# Patient Record
Sex: Male | Born: 1935 | Race: White | Hispanic: No | Marital: Married | Smoking: Former smoker
Health system: Southern US, Community
[De-identification: ages and names within clinical notes are randomized; demographics above are authoritative.]

## PROBLEM LIST (undated history)

## (undated) DIAGNOSIS — G4730 Sleep apnea, unspecified: Secondary | ICD-10-CM

## (undated) DIAGNOSIS — E785 Hyperlipidemia, unspecified: Secondary | ICD-10-CM

## (undated) DIAGNOSIS — D649 Anemia, unspecified: Secondary | ICD-10-CM

## (undated) DIAGNOSIS — E039 Hypothyroidism, unspecified: Secondary | ICD-10-CM

## (undated) DIAGNOSIS — R51 Headache: Secondary | ICD-10-CM

## (undated) DIAGNOSIS — Z860100 Personal history of colon polyps, unspecified: Secondary | ICD-10-CM

## (undated) DIAGNOSIS — I471 Supraventricular tachycardia, unspecified: Secondary | ICD-10-CM

## (undated) DIAGNOSIS — H3530 Unspecified macular degeneration: Secondary | ICD-10-CM

## (undated) DIAGNOSIS — E079 Disorder of thyroid, unspecified: Secondary | ICD-10-CM

## (undated) DIAGNOSIS — Z8719 Personal history of other diseases of the digestive system: Secondary | ICD-10-CM

## (undated) DIAGNOSIS — Z87438 Personal history of other diseases of male genital organs: Secondary | ICD-10-CM

## (undated) DIAGNOSIS — K219 Gastro-esophageal reflux disease without esophagitis: Secondary | ICD-10-CM

## (undated) DIAGNOSIS — R0602 Shortness of breath: Secondary | ICD-10-CM

## (undated) DIAGNOSIS — Z9889 Other specified postprocedural states: Secondary | ICD-10-CM

## (undated) HISTORY — DX: Personal history of other diseases of male genital organs: Z87.438

## (undated) HISTORY — DX: Gastro-esophageal reflux disease without esophagitis: K21.9

## (undated) HISTORY — DX: Other specified postprocedural states: Z98.890

## (undated) HISTORY — DX: Anemia, unspecified: D64.9

## (undated) HISTORY — DX: Hyperlipidemia, unspecified: E78.5

## (undated) HISTORY — DX: Personal history of colon polyps, unspecified: Z86.0100

## (undated) HISTORY — DX: Supraventricular tachycardia, unspecified: I47.10

## (undated) HISTORY — DX: Supraventricular tachycardia: I47.1

## (undated) HISTORY — PX: HERNIA REPAIR: SHX51

## (undated) HISTORY — DX: Disorder of thyroid, unspecified: E07.9

## (undated) HISTORY — DX: Personal history of colonic polyps: Z86.010

## (undated) HISTORY — DX: Personal history of other diseases of the digestive system: Z87.19

## (undated) HISTORY — PX: OTHER SURGICAL HISTORY: SHX169

## (undated) HISTORY — DX: Headache: R51

---

## 1963-08-01 DIAGNOSIS — Z9889 Other specified postprocedural states: Secondary | ICD-10-CM

## 1963-08-01 HISTORY — DX: Other specified postprocedural states: Z98.890

## 1963-08-01 HISTORY — PX: OTHER SURGICAL HISTORY: SHX169

## 1965-07-31 HISTORY — PX: THYROIDECTOMY: SHX17

## 2006-01-30 ENCOUNTER — Encounter: Payer: Self-pay | Admitting: Family Medicine

## 2006-11-21 ENCOUNTER — Ambulatory Visit: Payer: Self-pay | Admitting: Internal Medicine

## 2006-11-27 ENCOUNTER — Encounter: Payer: Self-pay | Admitting: Family Medicine

## 2006-11-27 ENCOUNTER — Ambulatory Visit: Payer: Self-pay

## 2006-12-19 ENCOUNTER — Ambulatory Visit (HOSPITAL_BASED_OUTPATIENT_CLINIC_OR_DEPARTMENT_OTHER): Admission: RE | Admit: 2006-12-19 | Discharge: 2006-12-19 | Payer: Self-pay | Admitting: Internal Medicine

## 2006-12-29 ENCOUNTER — Ambulatory Visit: Payer: Self-pay | Admitting: Pulmonary Disease

## 2007-01-01 ENCOUNTER — Encounter: Payer: Self-pay | Admitting: Family Medicine

## 2007-01-16 ENCOUNTER — Ambulatory Visit: Payer: Self-pay | Admitting: Internal Medicine

## 2007-07-09 ENCOUNTER — Encounter: Payer: Self-pay | Admitting: Family Medicine

## 2007-07-09 ENCOUNTER — Encounter: Admission: RE | Admit: 2007-07-09 | Discharge: 2007-07-09 | Payer: Self-pay | Admitting: Family Medicine

## 2007-12-25 ENCOUNTER — Encounter: Payer: Self-pay | Admitting: Family Medicine

## 2008-01-28 ENCOUNTER — Encounter (INDEPENDENT_AMBULATORY_CARE_PROVIDER_SITE_OTHER): Payer: Self-pay | Admitting: General Surgery

## 2008-01-28 ENCOUNTER — Ambulatory Visit (HOSPITAL_COMMUNITY): Admission: RE | Admit: 2008-01-28 | Discharge: 2008-01-28 | Payer: Self-pay | Admitting: General Surgery

## 2008-04-10 ENCOUNTER — Encounter: Payer: Self-pay | Admitting: Family Medicine

## 2008-04-17 ENCOUNTER — Encounter: Admission: RE | Admit: 2008-04-17 | Discharge: 2008-04-17 | Payer: Self-pay | Admitting: Gastroenterology

## 2008-04-29 ENCOUNTER — Encounter: Payer: Self-pay | Admitting: Family Medicine

## 2009-02-09 DIAGNOSIS — Z8679 Personal history of other diseases of the circulatory system: Secondary | ICD-10-CM | POA: Insufficient documentation

## 2009-02-09 DIAGNOSIS — R55 Syncope and collapse: Secondary | ICD-10-CM | POA: Insufficient documentation

## 2009-02-09 DIAGNOSIS — K219 Gastro-esophageal reflux disease without esophagitis: Secondary | ICD-10-CM | POA: Insufficient documentation

## 2009-02-09 DIAGNOSIS — Z860100 Personal history of colon polyps, unspecified: Secondary | ICD-10-CM | POA: Insufficient documentation

## 2009-02-09 DIAGNOSIS — R51 Headache: Secondary | ICD-10-CM | POA: Insufficient documentation

## 2009-02-09 DIAGNOSIS — M129 Arthropathy, unspecified: Secondary | ICD-10-CM | POA: Insufficient documentation

## 2009-02-09 DIAGNOSIS — R519 Headache, unspecified: Secondary | ICD-10-CM | POA: Insufficient documentation

## 2009-03-03 ENCOUNTER — Ambulatory Visit: Payer: Self-pay | Admitting: Family Medicine

## 2009-03-03 DIAGNOSIS — D649 Anemia, unspecified: Secondary | ICD-10-CM | POA: Insufficient documentation

## 2009-03-03 DIAGNOSIS — T50995A Adverse effect of other drugs, medicaments and biological substances, initial encounter: Secondary | ICD-10-CM | POA: Insufficient documentation

## 2009-03-03 DIAGNOSIS — E559 Vitamin D deficiency, unspecified: Secondary | ICD-10-CM | POA: Insufficient documentation

## 2009-03-03 DIAGNOSIS — E785 Hyperlipidemia, unspecified: Secondary | ICD-10-CM | POA: Insufficient documentation

## 2009-03-03 DIAGNOSIS — E039 Hypothyroidism, unspecified: Secondary | ICD-10-CM | POA: Insufficient documentation

## 2009-03-03 LAB — HM COLONOSCOPY

## 2009-03-03 LAB — CONVERTED CEMR LAB
Bilirubin Urine: NEGATIVE
Blood in Urine, dipstick: NEGATIVE
Glucose, Urine, Semiquant: NEGATIVE
Ketones, urine, test strip: NEGATIVE
Nitrite: NEGATIVE
Protein, U semiquant: NEGATIVE
Specific Gravity, Urine: 1.02
Urobilinogen, UA: 0.2
WBC Urine, dipstick: NEGATIVE
pH: 7

## 2009-03-25 LAB — CONVERTED CEMR LAB
ALT: 19 units/L (ref 0–53)
AST: 22 units/L (ref 0–37)
Albumin: 4 g/dL (ref 3.5–5.2)
Alkaline Phosphatase: 84 units/L (ref 39–117)
BUN: 9 mg/dL (ref 6–23)
Basophils Absolute: 0 10*3/uL (ref 0.0–0.1)
Basophils Relative: 0.5 % (ref 0.0–3.0)
Bilirubin, Direct: 0.1 mg/dL (ref 0.0–0.3)
CO2: 31 meq/L (ref 19–32)
Calcium: 9.3 mg/dL (ref 8.4–10.5)
Chloride: 104 meq/L (ref 96–112)
Cholesterol: 183 mg/dL (ref 0–200)
Creatinine, Ser: 1 mg/dL (ref 0.4–1.5)
Eosinophils Absolute: 0.2 10*3/uL (ref 0.0–0.7)
Eosinophils Relative: 3.5 % (ref 0.0–5.0)
GFR calc non Af Amer: 78.03 mL/min (ref >60)
Glucose, Bld: 104 mg/dL — ABNORMAL HIGH (ref 70–99)
HCT: 43.9 % (ref 39.0–52.0)
HDL: 43 mg/dL (ref >39.00)
Hemoglobin: 14.8 g/dL (ref 13.0–17.0)
LDL Cholesterol: 119 mg/dL — ABNORMAL HIGH (ref 0–99)
Lymphocytes Relative: 32.1 % (ref 12.0–46.0)
Lymphs Abs: 1.4 10*3/uL (ref 0.7–4.0)
MCHC: 33.8 g/dL (ref 30.0–36.0)
MCV: 86.3 fL (ref 78.0–100.0)
Monocytes Absolute: 0.6 10*3/uL (ref 0.1–1.0)
Monocytes Relative: 13.2 % — ABNORMAL HIGH (ref 3.0–12.0)
Neutro Abs: 2.1 10*3/uL (ref 1.4–7.7)
Neutrophils Relative %: 50.7 % (ref 43.0–77.0)
PSA: 0.88 ng/mL (ref 0.10–4.00)
Platelets: 191 10*3/uL (ref 150.0–400.0)
Potassium: 4.5 meq/L (ref 3.5–5.1)
RBC: 5.08 M/uL (ref 4.22–5.81)
RDW: 13 % (ref 11.5–14.6)
Sodium: 140 meq/L (ref 135–145)
TSH: 1.23 microintl units/mL (ref 0.35–5.50)
Total Bilirubin: 1 mg/dL (ref 0.3–1.2)
Total CHOL/HDL Ratio: 4
Total Protein: 7.7 g/dL (ref 6.0–8.3)
Triglycerides: 107 mg/dL (ref 0.0–149.0)
VLDL: 21.4 mg/dL (ref 0.0–40.0)
Vit D, 25-Hydroxy: 63 ng/mL (ref 30–89)
WBC: 4.3 10*3/uL — ABNORMAL LOW (ref 4.5–10.5)

## 2009-05-13 ENCOUNTER — Ambulatory Visit: Payer: Self-pay | Admitting: Family Medicine

## 2010-03-16 ENCOUNTER — Ambulatory Visit: Payer: Self-pay | Admitting: Family Medicine

## 2010-03-16 DIAGNOSIS — Z87898 Personal history of other specified conditions: Secondary | ICD-10-CM

## 2010-03-16 DIAGNOSIS — J310 Chronic rhinitis: Secondary | ICD-10-CM | POA: Insufficient documentation

## 2010-03-16 DIAGNOSIS — E785 Hyperlipidemia, unspecified: Secondary | ICD-10-CM | POA: Insufficient documentation

## 2010-03-17 ENCOUNTER — Ambulatory Visit: Payer: Self-pay | Admitting: Family Medicine

## 2010-03-23 LAB — CONVERTED CEMR LAB
ALT: 19 units/L (ref 0–53)
AST: 20 units/L (ref 0–37)
Albumin: 3.9 g/dL (ref 3.5–5.2)
Alkaline Phosphatase: 77 units/L (ref 39–117)
BUN: 14 mg/dL (ref 6–23)
Basophils Absolute: 0.1 10*3/uL (ref 0.0–0.1)
Basophils Relative: 1.1 % (ref 0.0–3.0)
Bilirubin Urine: NEGATIVE
Bilirubin, Direct: 0.1 mg/dL (ref 0.0–0.3)
Blood in Urine, dipstick: NEGATIVE
CO2: 27 meq/L (ref 19–32)
Calcium: 9.5 mg/dL (ref 8.4–10.5)
Chloride: 100 meq/L (ref 96–112)
Cholesterol: 186 mg/dL (ref 0–200)
Creatinine, Ser: 1 mg/dL (ref 0.4–1.5)
Eosinophils Absolute: 0.2 10*3/uL (ref 0.0–0.7)
Eosinophils Relative: 4.1 % (ref 0.0–5.0)
GFR calc non Af Amer: 78.72 mL/min (ref >60)
Glucose, Bld: 94 mg/dL (ref 70–99)
Glucose, Urine, Semiquant: NEGATIVE
HCT: 42.5 % (ref 39.0–52.0)
HDL: 37.2 mg/dL — ABNORMAL LOW (ref >39.00)
Hemoglobin: 14.2 g/dL (ref 13.0–17.0)
Ketones, urine, test strip: NEGATIVE
LDL Cholesterol: 123 mg/dL — ABNORMAL HIGH (ref 0–99)
Lymphocytes Relative: 29.9 % (ref 12.0–46.0)
Lymphs Abs: 1.5 10*3/uL (ref 0.7–4.0)
MCHC: 33.3 g/dL (ref 30.0–36.0)
MCV: 86.8 fL (ref 78.0–100.0)
Monocytes Absolute: 0.6 10*3/uL (ref 0.1–1.0)
Monocytes Relative: 12 % (ref 3.0–12.0)
Neutro Abs: 2.6 10*3/uL (ref 1.4–7.7)
Neutrophils Relative %: 52.9 % (ref 43.0–77.0)
Nitrite: NEGATIVE
PSA: 0.74 ng/mL (ref 0.10–4.00)
Platelets: 222 10*3/uL (ref 150.0–400.0)
Potassium: 4.2 meq/L (ref 3.5–5.1)
Protein, U semiquant: NEGATIVE
RBC: 4.9 M/uL (ref 4.22–5.81)
RDW: 14.6 % (ref 11.5–14.6)
Sodium: 135 meq/L (ref 135–145)
Specific Gravity, Urine: 1.02
TSH: 3.31 microintl units/mL (ref 0.35–5.50)
Total Bilirubin: 0.7 mg/dL (ref 0.3–1.2)
Total CHOL/HDL Ratio: 5
Total Protein: 6.6 g/dL (ref 6.0–8.3)
Triglycerides: 127 mg/dL (ref 0.0–149.0)
Urobilinogen, UA: 0.2
VLDL: 25.4 mg/dL (ref 0.0–40.0)
Vit D, 25-Hydroxy: 34 ng/mL (ref 30–89)
WBC Urine, dipstick: NEGATIVE
WBC: 5 10*3/uL (ref 4.5–10.5)
pH: 6

## 2010-03-30 ENCOUNTER — Telehealth: Payer: Self-pay | Admitting: Family Medicine

## 2010-04-08 ENCOUNTER — Encounter: Payer: Self-pay | Admitting: Family Medicine

## 2010-04-20 ENCOUNTER — Ambulatory Visit: Payer: Self-pay | Admitting: Family Medicine

## 2010-09-01 NOTE — Assessment & Plan Note (Signed)
Summary: FLU SHOT/CJR  Nurse Visit   Vitals Entered By: Duard Brady LPN (April 20, 2010 3:15 PM)  Allergies: No Known Drug Allergies  Orders Added: 1)  Flu Vaccine 35yrs + MEDICARE PATIENTS [Q2039] 2)  Administration Flu vaccine - MCR [G0008] Flu Vaccine Consent Questions     Do you have a history of severe allergic reactions to this vaccine? no    Any prior history of allergic reactions to egg and/or gelatin? no    Do you have a sensitivity to the preservative Thimersol? no    Do you have a past history of Guillan-Barre Syndrome? no    Do you currently have an acute febrile illness? no    Have you ever had a severe reaction to latex? no    Vaccine information given and explained to patient? yes    Are you currently pregnant? no    Lot Number:[id number]   Exp Date:01/28/2011   Site Given  Left Deltoid IM.lbmedflu

## 2010-09-01 NOTE — Progress Notes (Signed)
Summary: referral to dr Manon Hilding   Phone Note Call from Patient   Caller: Patient Reason for Call: Referral Summary of Call: pt calling to inquire about referral to dr grapey  Initial call taken by: Pura Spice, RN,  March 30, 2010 10:49 AM  Follow-up for Phone Call        per dr Alfonzo Feller notes in aug office visit pt is to be referred to dr Manon Hilding and informed pt  once terri sch appt she will contact him.  Follow-up by: Pura Spice, RN,  March 30, 2010 10:49 AM

## 2010-09-01 NOTE — Letter (Signed)
Summary: Personal History provided by patient  Personal History provided by patient   Imported By: Maryln Gottron 03/08/2009 10:39:44  _____________________________________________________________________  External Attachment:    Type:   Image     Comment:   External Document

## 2010-09-01 NOTE — Letter (Signed)
Summary: [facility]   Imported By: Maryln Gottron 02/15/2009 13:47:47  _____________________________________________________________________  External Attachment:    Type:   Image     Comment:   External Document

## 2010-09-01 NOTE — Assessment & Plan Note (Signed)
Summary: emp-will fast//ccm   Vital Signs:  Patient profile:   76 year old male Height:      69 inches Weight:      170 pounds BMI:     25.20 O2 Sat:      95 % Temp:     98.8 degrees F Pulse rate:   70 / minute BP sitting:   140 / 80  (left arm)  Vitals Entered By: Pura Spice, RN (March 03, 2009 9:27 AM)  History of Present Illness: Pt in to [facility] as new pt, since beinbg =my patient in past has been under care Dr. Melrose Nakayama, now to reestablis Has een doing fine, had rt inguinal hernia repaired, under care Dr.Kliein for tachycasrdia, and other arrhthmia gerd controlled with prilosec Had goitre and then thyroidectomy, age 66, has been taking synthroid 125 micrograms daily quit smoking 7/31/110 complains some stiffness joints, no specific tx nocturia x 2 Had endoscopy and colonoscopy Sept 09, few polyps goes to chiropractor occasionally , doe back exercises., no medications  Preventive Screening-Counseling & Management  Alcohol-Tobacco     Smoking Status: quit  Allergies (verified): No Known Drug Allergies  Past History:  Past Surgical History: Thyroidectomy  1967 bilateral hernia repair 1991 2009 CYSTOSCOPY 1965 OTHER HX PNEUMONIA 76 YO BROKEN BONES FOOT 76 YO BROKEN FINGER 76 YO SKIN CANCER FACE LAST CK ON 08-22-07   Family History: breast cancer parents grandparents  heart disease parents  father deceased age 43 htn parents   MOTHER HX   mmother deceased ate 10  hx mother having dementia started at age 50 breast cancer at aage 52 thyroid disease macular degeneration    FATHER HX  father deceased at age 19 hypertension pacemaker  congestive heart failure he also had 2 hernia repairs dementia stated at age 100  Social History: Married Airline pilot Former Smoker  QUIT 1977  Smoking Status:  quit  Review of Systems      See HPI General:  See HPI; Denies chills, fatigue, fever, loss of appetite, malaise, sleep disorder, sweats, weakness, and  weight loss. Eyes:  Denies blurring, discharge, double vision, eye irritation, eye pain, halos, itching, light sensitivity, red eye, vision loss-1 eye, and vision loss-both eyes. ENT:  Denies decreased hearing, difficulty swallowing, ear discharge, earache, hoarseness, nasal congestion, nosebleeds, postnasal drainage, ringing in ears, sinus pressure, and sore throat. CV:  arrhthymia, on toprol, sees Dr. Clide Cliff. Resp:  Denies chest discomfort, chest pain with inspiration, cough, coughing up blood, excessive snoring, hypersomnolence, morning headaches, pleuritic, shortness of breath, sputum productive, and wheezing. GI:  Complains of indigestion; denies abdominal pain, bloody stools, change in bowel habits, constipation, dark tarry stools, diarrhea, excessive appetite, gas, hemorrhoids, loss of appetite, nausea, vomiting, vomiting blood, and yellowish skin color; gerd, prilosec. GU:  Complains of nocturia; nocturia x 2. MS:  Complains of mid back pain and stiffness. Derm:  Denies changes in color of skin, changes in nail beds, dryness, excessive perspiration, flushing, hair loss, insect bite(s), itching, lesion(s), poor wound healing, and rash. Neuro:  Denies brief paralysis, difficulty with concentration, disturbances in coordination, falling down, headaches, inability to speak, memory loss, numbness, poor balance, seizures, sensation of room spinning, tingling, tremors, visual disturbances, and weakness. Psych:  Denies alternate hallucination ( auditory/visual), anxiety, depression, easily angered, easily tearful, irritability, mental problems, panic attacks, sense of great danger, suicidal thoughts/plans, thoughts of violence, unusual visions or sounds, and thoughts /plans of harming others. Endo:  Denies cold intolerance, excessive hunger, excessive thirst,  excessive urination, heat intolerance, polyuria, and weight change.  Physical Exam  General:  Well-developed,well-nourished,in no acute distress;  alert,appropriate and cooperative throughout examination Head:  Normocephalic and atraumatic without obvious abnormalities. No apparent alopecia or balding. Eyes:  No corneal or conjunctival inflammation noted. EOMI. Perrla. Funduscopic exam benign, without hemorrhages, exudates or papilledema. Vision grossly normal. Ears:  External ear exam shows no significant lesions or deformities.  Otoscopic examination reveals clear canals, tympanic membranes are intact bilaterally without bulging, retraction, inflammation or discharge. Hearing is grossly normal bilaterally. Nose:  External nasal examination shows no deformity or inflammation. Nasal mucosa are pink and moist without lesions or exudates. Mouth:  Oral mucosa and oropharynx without lesions or exudates.  Teeth in good repair. Neck:  thyroidectomy scar Chest Wall:  No deformities, masses, tenderness or gynecomastia noted. Breasts:  No masses or gynecomastia noted Lungs:  Normal respiratory effort, chest expands symmetrically. Lungs are clear to auscultation, no crackles or wheezes. Heart:  Normal rate and regular rhythm. S1 and S2 normal without gallop, murmur, click, rub or other extra sounds. Abdomen:  Bowel sounds positive,abdomen soft and non-tender without masses, organomegaly or hernias noted, herniorraphy scar rt. Rectal:  No external abnormalities noted. Normal sphincter tone. No rectal masses or tenderness. Genitalia:  Testes bilaterally descended without nodularity, tenderness or masses. No scrotal masses or lesions. No penis lesions or urethral discharge. Prostate:  Prostate gland firm and smooth, no enlargement, nodularity, tenderness, mass, asymmetry or induration. Msk:  No deformity or scoliosis noted of thoracic or lumbar spine.   Pulses:  R and L carotid,radial,femoral,dorsalis pedis and posterior tibial pulses are full and equal bilaterally Extremities:  No clubbing, cyanosis, edema, or deformity noted with normal full range of  motion of all joints.   Neurologic:  No cranial nerve deficits noted. Station and gait are normal. Plantar reflexes are down-going bilaterally. DTRs are symmetrical throughout. Sensory, motor and coordinative functions appear intact. Skin:  Intact without suspicious lesions or rashes Cervical Nodes:  No lymphadenopathy noted Axillary Nodes:  No palpable lymphadenopathy Inguinal Nodes:  No significant adenopathy Psych:  Cognition and judgment appear intact. Alert and cooperative with normal attention span and concentration. No apparent delusions, illusions, hallucinations   Impression & Recommendations:  Problem # 1:  HYPOTHYROIDISM (ICD-244.9) Assessment Improved  His updated medication list for this problem includes:    Synthroid 125 Mcg Tabs (Levothyroxine sodium) .Marland Kitchen... 1 once daily  Orders: TLB-TSH (Thyroid Stimulating Hormone) [phone]) Prescription Created Electronically [phone])  Problem # 2:  COLONIC POLYPS, HX OF (ICD-V12.72) Assessment: Unchanged  Problem # 3:  ARRHYTHMIA, HX OF (ICD-V12.50) Assessment: Improved  Problem # 4:  GERD (ICD-530.81) Assessment: Improved  His updated medication list for this problem includes:    Prilosec 20 Mg Cpdr (Omeprazole) .Marland Kitchen... 1 by mouth once daily  Problem # 5:  HEADACHE (ICD-784.0) Assessment: Improved  His updated medication list for this problem includes:    Toprol Xl 50 Mg Xr24h-tab (Metoprolol succinate) .Marland Kitchen... 1 by mouth once daily    Aspir-low 81 Mg Tbec (Aspirin) .Marland Kitchen... 1 by mouth once daily    Tylenol Extra Strength 500 Mg Tabs (Acetaminophen) .Marland Kitchen... 2 by mouth once daily  Problem # 6:  ARTHRITIS (ICD-716.90) Assessment: Improved  Complete Medication List: 1)  Synthroid 125 Mcg Tabs (Levothyroxine sodium) .Marland Kitchen.. 1 once daily 2)  Toprol Xl 50 Mg Xr24h-tab (Metoprolol succinate) .Marland Kitchen.. 1 by mouth once daily 3)  Prilosec 20 Mg Cpdr (Omeprazole) .Marland Kitchen.. 1 by mouth once daily 4)  Aspir-low 81  Mg Tbec (Aspirin) .Marland Kitchen.. 1 by mouth  once daily 5)  Eye Vitamin  6)  Multivitamins Tabs (Multiple vitamin) .... Once daily 7)  Vitamin B-12 100 Mcg Tabs (Cyanocobalamin) .... 1/2 tab once daily 8)  Tylenol Extra Strength 500 Mg Tabs (Acetaminophen) .... 2 by mouth once daily 9)  Benadryl 25 Mg Caps (Diphenhydramine hcl) .Marland Kitchen.. 1 or 2 hs as needed 10)  Cetirizine Hcl 10 Mg Tabs (Cetirizine hcl) .Marland Kitchen.. 1 once daily 11)  Calcium 600 600 Mg Tabs (Calcium carbonate) .Marland Kitchen.. 1 once daily 12)  Sudafed 30 Mg Tabs (Pseudoephedrine hcl) .... 2 by mouth once daily fi needed for sinus decongestant  Other Orders: Tetanus Toxoid w/Dx [phone]) Admin 1st Vaccine (60454) Venipuncture [phone]) T-Vitamin D (25-Hydroxy) ([id number]) UA Dipstick w/o Micro (automated)  (81003) TLB-Lipid Panel (80061-LIPID) TLB-BMP (Basic Metabolic Panel-BMET) (80048-METABOL) TLB-CBC Platelet - w/Differential (85025-CBCD) TLB-Hepatic/Liver Function Pnl (80076-HEPATIC) TLB-PSA (Prostate Specific Antigen) (84153-PSA)  Patient Instructions: 1)  will call lab results 2)  continue regular medications 3)  call as needed 4)  will refill medications Prescriptions: TOPROL XL 50 MG XR24H-TAB (METOPROLOL SUCCINATE) 1 by mouth once daily   dr Sherryl Manges  #90 x 3   Entered and Authorized by:   Judithann Sheen MD   Signed by:   Judithann Sheen MD on 03/03/2009   Method used:   Print then Give to Patient   RxID:   [id number] SYNTHROID 125 MCG TABS (LEVOTHYROXINE SODIUM) 1 once daily  #90 x 3   Entered and Authorized by:   Judithann Sheen MD   Signed by:   Judithann Sheen MD on 03/03/2009   Method used:   Print then Give to Patient   RxID:   [id number]   Preventive Care Screening  Colonoscopy:    Next Due:  04/2011    Immunization History:  Zostavax History:    Zostavax # 1:  zostavax (01/30/2006)    Zostavax # 1:  zostavax (01/30/2006)  Immunizations Administered:  Tetanus Vaccine:    Vaccine Type: Td    Site: left deltoid     Mfr: Sanofi Pasteur    Dose: 0.5 ml    Route: IM    Given by: Pura Spice, RN    Exp. Date: 10/02/2010    Lot #: [id number]   Laboratory Results   Urine Tests    Routine Urinalysis   Color: yellow Appearance: Clear Glucose: negative   (Normal Range: Negative) Bilirubin: negative   (Normal Range: Negative) Ketone: negative   (Normal Range: Negative) Spec. Gravity: 1.020   (Normal Range: 1.003-1.035) Blood: negative   (Normal Range: Negative) pH: 7.0   (Normal Range: 5.0-8.0) Protein: negative   (Normal Range: Negative) Urobilinogen: 0.2   (Normal Range: 0-1) Nitrite: negative   (Normal Range: Negative) Leukocyte Esterace: negative   (Normal Range: Negative)    Comments: Rita Ohara  March 03, 2009 11:27 AM

## 2010-09-01 NOTE — Procedures (Signed)
Summary: Colonoscopy with biopsy/[facility]  Colonoscopy with biopsy/[facility]   Imported By: Maryln Gottron 02/15/2009 13:50:03  _____________________________________________________________________  External Attachment:    Type:   Image     Comment:   External Document

## 2010-09-01 NOTE — Letter (Signed)
Summary: Zostavax/[facility]  Zostavax/[facility]   Imported By: Maryln Gottron 03/08/2009 10:48:05  _____________________________________________________________________  External Attachment:    Type:   Image     Comment:   External Document

## 2010-09-01 NOTE — Assessment & Plan Note (Signed)
Summary: FLU SHOT/CJR  Nurse Visit   Review of Systems       Flu Vaccine Consent Questions     Do you have a history of severe allergic reactions to this vaccine? no    Any prior history of allergic reactions to egg and/or gelatin? no    Do you have a sensitivity to the preservative Thimersol? no    Do you have a past history of Guillan-Barre Syndrome? no    Do you currently have an acute febrile illness? no    Have you ever had a severe reaction to latex? no    Vaccine information given and explained to patient? yes    Are you currently pregnant? no    Lot Number:[id number]   Exp Date:01/27/2010   Site Given  Left Deltoid IM    Allergies: No Known Drug Allergies  Orders Added: 1)  Flu Vaccine 65yrs + [90658] 2)  Administration Flu vaccine - MCR [G0008]

## 2010-09-01 NOTE — Assessment & Plan Note (Signed)
Summary: med check/refill/cjr   Vital Signs:  Patient profile:   76 year old male Weight:      175 pounds O2 Sat:      96 % Temp:     98.3 degrees F Pulse rate:   67 / minute Pulse rhythm:   regular BP sitting:   140 / 86  (left arm)  Vitals Entered By: Pura Spice, RN (March 16, 2010 11:35 AM) CC: refills  to [facility]    History of Present Illness: This 76 year old white married male is in to discuss his and refill this her medication, to [facility] case and her prescriptions to [facility] He is concerned about our old control as well as a new complaint of some headaches when there is patchy minutes the pain he has nasal congestion. Has no chest pain shortness of breath GERD is well controlled with Prilosec 2 check lab studies were his thyroid status Has a history of arrhythmias controlled with Toprol  Allergies (verified): No Known Drug Allergies  Past History:  Past Medical History: Last updated: 02/09/2009 Headache GERD Colonic polyps, hx of  dr Kinnie Scales removed 2 in 2009 1965 cystoscopy 1967 thyroid operation--removed 1978  hermia repair --rt side 1991 hernia repair left side 2009 hernia repair, right side    HX PAROSYSMAL SUPRAVENTRICULAR TACHYCARDIA  Past Surgical History: Last updated: 03/03/2009 Thyroidectomy  1967 bilateral hernia repair 1991 2009 CYSTOSCOPY 1965 OTHER HX PNEUMONIA 76 YO BROKEN BONES FOOT 76 YO BROKEN FINGER 76 YO SKIN CANCER FACE LAST CK ON 08-22-07   Social History: Last updated: 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/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   Risk Factors: Smoking Status: quit (03/03/2009)  Review of Systems      See HPI  The patient denies anorexia, fever, weight loss, weight gain, vision loss, decreased hearing, hoarseness, chest pain, syncope, dyspnea on exertion, peripheral edema, prolonged cough, headaches, hemoptysis, abdominal pain, melena, hematochezia, severe indigestion/heartburn, hematuria, incontinence, genital sores, muscle weakness,  suspicious skin lesions, transient blindness, difficulty walking, depression, unusual weight change, abnormal bleeding, enlarged lymph nodes, angioedema, breast masses, and testicular masses.    Physical Exam  General:  Well-developed,well-nourished,in no acute distress; alert,appropriate and cooperative throughout examination Head:  Normocephalic and atraumatic without obvious abnormalities. No apparent alopecia or balding. Eyes:  No corneal or conjunctival inflammation noted. EOMI. Perrla. Funduscopic exam benign, without hemorrhages, exudates or papilledema. Vision grossly normal. Ears:  External ear exam shows no significant lesions or deformities.  Otoscopic examination reveals clear canals, tympanic membranes are intact bilaterally without bulging, retraction, inflammation or discharge. Hearing is grossly normal bilaterally. Nose:  boggy nasal mucosa no mucosa swollen Mouth:  Oral mucosa and oropharynx without lesions or exudates.  Teeth in good repair. Neck:  thyroid non-palp Chest Wall:  No deformities, masses, tenderness or gynecomastia noted. Lungs:  Normal respiratory effort, chest expands symmetrically. Lungs are clear to auscultation, no crackles or wheezes. Heart:  Normal rate and regular rhythm. S1 and S2 normal without gallop, murmur, click, rub or other extra sounds. Abdomen:  Bowel sounds positive,abdomen soft and non-tender without masses, organomegaly or hernias noted. Rectal:  No external abnormalities noted. Normal sphincter tone. No rectal masses or tenderness. Genitalia:  Testes bilaterally descended without nodularity, tenderness or masses. No scrotal masses or lesions. No penis lesions or urethral discharge. Prostate:  no nodules, no asymmetry, and 1+ enlarged.   Msk:  No deformity or scoliosis noted of thoracic or lumbar spine.   Extremities:  No clubbing, cyanosis, edema, or deformity noted with  normal full range of motion of all joints.     Impression &  Recommendations:  Problem # 1:  HYPOTHYROIDISM (ICD-244.9) Assessment Improved  His updated medication list for this problem includes:    Synthroid 125 Mcg Tabs (Levothyroxine sodium) .Marland Kitchen... 1 once daily  Orders: Prescription Created Electronically [phone])  Problem # 2:  ARRHYTHMIA, HX OF (ICD-V12.50) Assessment: Improved Toprol XL 50 mg q.d.  Problem # 3:  GERD (ICD-530.81) Assessment: Improved  His updated medication list for this problem includes:    Prilosec 20 Mg Cpdr (Omeprazole) .Marland Kitchen... 1 by mouth once daily  Problem # 4:  HEADACHE (ICD-784.0) Assessment: Unchanged  His updated medication list for this problem includes:    Toprol Xl 50 Mg Xr24h-tab (Metoprolol succinate) .Marland Kitchen... 1 by mouth once daily    Aspir-low 81 Mg Tbec (Aspirin) .Marland Kitchen... 1 by mouth once daily    Tylenol Extra Strength 500 Mg Tabs (Acetaminophen) .Marland Kitchen... 2 by mouth once daily  Problem # 5:  RHINITIS (ICD-472.0) Assessment: New Sudafed  Complete Medication List: 1)  Synthroid 125 Mcg Tabs (Levothyroxine sodium) .Marland Kitchen.. 1 once daily 2)  Toprol Xl 50 Mg Xr24h-tab (Metoprolol succinate) .Marland Kitchen.. 1 by mouth once daily 3)  Prilosec 20 Mg Cpdr (Omeprazole) .Marland Kitchen.. 1 by mouth once daily 4)  Aspir-low 81 Mg Tbec (Aspirin) .Marland Kitchen.. 1 by mouth once daily 5)  Eye Vitamin  6)  Multivitamins Tabs (Multiple vitamin) .... Once daily 7)  Vitamin B-12 100 Mcg Tabs (Cyanocobalamin) .... 1/2 tab once daily 8)  Tylenol Extra Strength 500 Mg Tabs (Acetaminophen) .... 2 by mouth once daily 9)  Benadryl 25 Mg Caps (Diphenhydramine hcl) .Marland Kitchen.. 1 or 2 hs as needed 10)  Cetirizine Hcl 10 Mg Tabs (Cetirizine hcl) .Marland Kitchen.. 1 once daily 11)  Calcium 600 600 Mg Tabs (Calcium carbonate) .Marland Kitchen.. 1 once daily 12)  Sudafed 30 Mg Tabs (Pseudoephedrine hcl) .... 2 by mouth once daily fi needed for sinus decongestant  Patient Instructions: 1)  refilled medications 2)  very pleased with size of prostate 3)  will schedule appt Dr. Isabel Caprice 4)  Please schedule  full labs on  Aug18 5)  we'll call results of labs Prescriptions: TOPROL XL 50 MG XR24H-TAB (METOPROLOL SUCCINATE) 1 by mouth once daily  #90 x 3   Entered and Authorized by:   Judithann Sheen MD   Signed by:   Judithann Sheen MD on 03/16/2010   Method used:   Faxed to ...       [facility] (mail-order)             , [place]         Ph: [phone]       Fax: [phone]   RxID:   [id number] SYNTHROID 125 MCG TABS (LEVOTHYROXINE SODIUM) 1 once daily  #90 x 3   Entered and Authorized by:   Judithann Sheen MD   Signed by:   Judithann Sheen MD on 03/16/2010   Method used:   Faxed to ...       [facility] (mail-order)             , [place]         Ph: [phone]       Fax: [phone]   RxID:   [id number]

## 2010-09-02 NOTE — Consult Note (Signed)
Summary: [facility]  [facility]   Imported By: Maryln Gottron 04/14/2010 15:08:14  _____________________________________________________________________  External Attachment:    Type:   Image     Comment:   External Document

## 2010-12-13 NOTE — Op Note (Signed)
[name], [name]                 ACCOUNT NO.:  [id number]   MEDICAL RECORD NO.:  [id number]          PATIENT TYPE:  AMB   LOCATION:  DAY                          FACILITY:  [facility]   PHYSICIAN:  Sharlet Salina T. Hoxworth, M.D.DATE OF BIRTH:  11-23-1934   DATE OF PROCEDURE:  01/28/2008  DATE OF DISCHARGE:                               OPERATIVE REPORT   PREOPERATIVE DIAGNOSIS:  Recurrent right inguinal hernia and inguinal  mass.   POSTOPERATIVE DIAGNOSIS:  Recurrent right inguinal hernia and inguinal  mass.   SURGICAL PROCEDURE:  Repair of recurrent right inguinal hernia with mesh  and excision of inguinal mass.   SURGEON:  Lorne Skeens. Hoxworth, M.D.   ANESTHESIA:  General.   BRIEF HISTORY:  [name] is a 76 year old male with a history of a right  inguinal hernia repair in the late 1970s.  For about the past 10 years,  he has had intermittent recurrent swelling in the right groin which is  gradually getting worse, and in recent months he has had several severe  episodes of pain in the right groin relieved by lying down the floor and  pushing the hernia back in.  On examination, he has an only partially  reducible right inguinal hernia and also has a separate slightly tender  mass in the right groin, down around the pubic tubercle, questionably  associated with the cord.  This is somewhat tender as well.  With this  constellation of findings, I have recommended open repair with mesh of  his recurrent hernia and excision of the mass in the inguinal canal.  The nature of the procedure, indications, risks of bleeding, infection,  and recurrence were discussed and understood.  He is now brought to the  operating room for this procedure.   DESCRIPTION OF OPERATION:  The patient was brought to the operating room  and placed in the supine position on the operating table, and general  endotracheal anesthesia was induced.  The right groin was widely  sterilely prepped and draped.  He  received preoperative IV antibiotics.  Correct patient and procedure were verified.  I used the previous  oblique incision in the right groin, and dissection was carried down  through the subcutaneous tissue.  The external oblique was identified,  with some scarring around the previous incision.  I went a little more  lateral to that to fresh normal-appearing external oblique, and this was  divided along the line of its fibers, and then down over the scarred  area as well, bluntly dissected this up off of cord structures, and  opening it up through the external ring.  The external oblique was  dissected up off the cord structures, going through some scar tissue as  well, taking it back up medially toward the rectus sheath and laterally  out to the inguinal ligament.  Cord structures were identified, and  these were encircled with Penrose at the pubic tubercle.  There was not  that much scarring, as there had not been previous use of mesh.  The  cord was freed up from scarring in attachments through  the inguinal  canal up to the internal ring.  There was a large mass of fatty tissue  which was separate from the cord structures, coming up through the floor  of the inguinal canal.  This was freed from surrounding adhesions and  mobilized down to the level of the floor of the inguinal canal, where  there was found to be coming through a fairly tight defect just about 1-  1.5 cm in diameter.  I then opened this structure, which was a  peritoneal sac with a good deal of preperitoneal fat.  This sac was then  suture-ligated right at the level of the floor of the canal with 2-0  silk and then removed, and the stump of the sac reduced back through the  floor of the inguinal canal.  Again, this was a very discrete defect  about 1-1.5 cm in diameter.  This was closed with a figure-of-eight  Prolene suture.  At this point, feeling down medially over the pubic  tubercle but superficial to the cord and  the deep subcutaneous, there  was an about 2 x 1 cm rubbery, smooth, firm mass which was not adherent  to the cord.  Cord and testicle were normal at this point.  Mass was  more adherent to the deep subcutaneous tissue.  This was dissected free  with cautery.  While grasping with an Allis clamp, it did open a little  bit and contained apparent sebaceous-type of material, but no odor or  any evidence of infection.  This was all cleaned and irrigated, and the  mass was completely excised from the deep subcutaneous with cautery.  It  did not appear to extend up to the level of the skin.  This was sent for  permanent sections.  The wound was thoroughly irrigated.  Hemostasis was  ensured.  A piece of Parietex mesh was chosen and trimmed to size to fit  the floor of the inguinal canal, and tails around the cord of the  internal ring.  It was sutured initially to the pubic tubercle, and then  working medial to lateral, sutured to the inguinal ligament with running  2-0 Prolene.  Medially, the mesh was sutured to the edge of the rectus  sheath with interrupted Prolene, and then the tails were tacked together  lateral to the internal ring, creating a snug internal ring around the  cord structures.  This provided nice broad coverage of the direct and  indirect spaces, and nice broad coverage of the previous small defect as  noted.  Soft tissue was infiltrated with Marcaine.  The wound was  reirrigated, and hemostasis ensured.  Cord was returned to its anatomic  position.  The external oblique was closed over this with running 3-0  Vicryl.  Scarpa's fascia was closed with running 3-0 Vicryl.  The skin  was closed with subcuticular Monocryl and Dermabond.  Sponge, needle,  and instrument counts were correct.  The patient was taken to recovery  in good condition.      Lorne Skeens. Hoxworth, M.D.  Electronically Signed     BTH/MEDQ  D:  01/28/2008  T:  01/28/2008  Job:  [id number]

## 2010-12-13 NOTE — Assessment & Plan Note (Signed)
[facility]                         ELECTROPHYSIOLOGY OFFICE NOTE   IZAC, FAULKENBERRY                          MRN:          [id number]  DATE:01/16/2007                            DOB:          Feb 23, 1935    Mr. Orndoff comes back.  He is doing better since his sleep study and is  wearing the mask.  His Cardiolite was negative.  Overall, he is doing  better and quite pleased with how he is doing.  He is tolerating his  medications - specifically the metoprolol.   On examination, his blood pressure was 104/62, pulse was 60.  Lungs were  clear, heart sounds were regular and extremities were without edema.   IMPRESSION:  1. Atypical chest pain.  2. Sleep apnea.  3. Palpitations with prior history of documented supraventricular      tachycardia.   We will plan to see Mr. Bondy as needed.  Otherwise, he will follow up  with Dr. Artis Flock.     Duke Salvia, MD, [facility]  Electronically Signed    SCK/MedQ  DD: 01/16/2007  DT: 01/16/2007  Job #: [id number]

## 2010-12-16 NOTE — Letter (Signed)
November 21, 2006    Quita Skye. Artis Flock, M.D.  [address]  [place], [place] [address]   RE:  [name], [name]  MRN:  [id number]  /  DOB:  09-Sep-1934   Dear [name]:   It was a pleasure to see [name] today at your request.  As you  know, he is a 76 year old gentleman that I saw about 6 or 7 years ago  for episodes of syncope.  These turned out to be related to short  episodes of a supraventricular tachycardia felt to be AV nodal reentry  based on a long PR interval.  He was treated with Toprol and these have  been largely quiescent until recently where he has 2 episodes of  presyncope.  These, like his previous episodes, are unassociated with  palpitations.  Their duration is only a couple of seconds and then they  abate.  There is no associated chest pain or shortness of breath.   In addition, of concern to the patient, is new onset exertional  shortness of breath.  This has been going on for the last 2-3 months.  It happens at a distance of only 90-100 feet or so.  It is unaccompanied  by chest pain, diaphoresis, or shortness of breath.  It is accompanied  by fatigue.  It is noteworthy that the patient started walking about a  year ago, stopped about 3 or 4 months ago, not quite sure why, but it is  concurrent with this.  This continuation of his shortness of breath has  been more problematic.   The patient also complains of chest discomfort and left arm discomfort.  This has occurred primarily at rest.  He notices it mostly when he is  watching television.   His cardiac risk factors are negative for hypertension, diabetes,  cigarettes or family history.  His LDL is 110.   His wife also notes that he has been taking a lot of naps recently.  He  has a terrible snoring problem.  He has had a dental implant which he  has not been able to wear for the last 3-4 months.   Around a month ago as well, his Toprol was exchanged out for generic,  and he is wondering whether  this has contributed to his sense of not  being quite as well as he had been.   His past medical history in addition to the above is notable for:  1. Allergies.  2. Hiatal hernia with GE reflux disease.  3. Thyroid disease with a subtotal thyroidectomy.   His past surgical history is notable for the above as well as inguinal  herniorrhaphy x2 and cystoscopy.   SOCIAL HISTORY:  He is married, he has 2 children.  He does not use  cigarettes or recreational drugs.  He drinks a couple of drinks a week.  He exercises until as noted previously.   REVIEW OF SYSTEMS:  Otherwise reviewed and is broadly negative, apart  from the organ systems as noted above.   CURRENT MEDICATIONS:  1. Synthroid 125 mcg.  2. Metoprolol succinate 50.  3. Prilosec 20.  4. Vitamins.   He has no known drug allergies.   PHYSICAL EXAMINATION:  He is an elderly Caucasian male appearing his  stated age of 7.  His blood pressure is 116/81, his pulse is 61, his weight is 169.  HEENT EXAM:  Demonstrated no icterus or xanthoma.  The neck veins were flat.  The carotids were brisk  and full bilaterally  without bruits.  The back was without kyphosis, scoliosis.  Lungs were clear.  Heart sounds were regular without murmurs or gallops.  The abdomen was soft with active bowel sounds without midline pulsation  or hepatomegaly.  Femoral pulses were 2+, distal pulses were intact.  There is no  cyanosis, clubbing, or edema.  NEUROLOGIC EXAM:  Grossly normal.  His skin was warm and dry.   Electrocardiogram dated today demonstrated sinus rhythm at 61 with  intervals of 0.16/0.08/0.41.  There was T-wave flattening rather  diffusely, which is not significantly different from his  electrocardiogram from 5 years ago.   IMPRESSION:  1. Presyncope with previous syncope associated with documented      supraventricular tachycardia.  2. New onset exercise shortness of breath.  3. Atypical chest pain.  4. Daytime somnolence  and significant nocturnal snoring.   [name], Mr. Turano presents a couple of issues.  I first am concerned that  his presyncope may represent a recurrence of his supraventricular  tachycardia.  My thought was that we will just continue to watch this  for right now as it has not been nearly as symptomatic as it was  previously.  In the event that it recurs, an event recorder would be  useful, again, in clarifying.   His exercise shortness of breath is concerning.  He is certainly at the  age demographic where coronary disease or LV dysfunction might be  contributing.  His left arm pain is concerning in this regard, but not  so much that I think catheterization is necessary.  I have reviewed the  issues of certainty, sensitivity, and specificity with the patient and  his wife, and we will plan to proceed with Myoview stress testing to try  and clarify the above.   In addition, I have taken the liberty of setting him up for a sleep  study and will have those results forwarded to you.   I have asked in the interim that he go back on Toprol instead of the  metoprolol, and to see if that is contributing to his symptoms.   [name], thanks, again, very much.    Sincerely,      [name] Salvia, MD, [facility]  Electronically Signed    SCK/MedQ  DD: 11/21/2006  DT: 11/21/2006  Job #: [id number]   CC:    Griffith Citron, M.D.

## 2010-12-16 NOTE — Procedures (Signed)
NAMEGRAESYN, SCHREIFELS NO.:  [id number]   MEDICAL RECORD NO.:  [id number]          PATIENT TYPE:  OUT   LOCATION:  SLEEP CENTER                 FACILITY[facility]  [facility]   PHYSICIAN:  Barbaraann Share, MD,FCCPDATE OF BIRTH:  1936/09/09   DATE OF STUDY:  01/01/2007                            NOCTURNAL POLYSOMNOGRAM   REFERRING PHYSICIAN:  Duke Salvia, MD, [facility]   INDICATION FOR STUDY:  Hypersomnia with sleep apnea.   EPWORTH SLEEPINESS SCORE:  7.   MEDICATIONS:   SLEEP ARCHITECTURE:  The patient had a total sleep time of 309 minutes  with borderline quantity of REM and decreased slow wave sleep.  Sleep  onset latency was normal and REM onset was fairly rapid at 36 minutes.  Sleep efficiency was decreased at 71%.   RESPIRATORY DATA:  The patient underwent split night protocol where he  was found to have 69 obstructive events in the first 138 minutes of  sleep.  This gave the patient an extrapolated apnea-hypopnea index of 30  events per hour over that time period.  The events occurred in all body  positions and there was moderate to loud snoring noted throughout.  By  protocol the patient was then placed on a ResMed medium Mirage Quattro  full face mask and ultimately titrated to a final pressure of 10 cmH2O  with good control of his obstructive events.   OXYGEN DATA:  There was O2 desaturation as low as 83% with the patient's  obstructive events.   CARDIAC DATA:  Occasional PACs but no clinically significant arrhythmias  were noted.   MOVEMENT-PARASOMNIA:  The patient was found to have 236 leg jerks with 1  per hour resulting in arousal or awakening.   IMPRESSIONS-RECOMMENDATIONS:  1. Split night study reveals moderate obstructive sleep apnea with an      apnea-hypopnea index of 30 events per hour and O2 saturation as low      as 83% in the first half of the night.  Patient was then placed on      continuous positive airway pressure with a ResMed Mirage  Quattro      full face mask and ultimately titrated to a final pressure of 10      cmH2O with excellent control of his obstructive events even through      rapid eye movement.  2. Occasional premature atrial contractions noted but no clinically      significant arrhythmias.  3. Very large numbers of leg jerks with minimal sleep disruption.      These were more than likely related to the patient's sleep      disordered breathing.      Barbaraann Share, MD,FCCP  Diplomate, American Board of Sleep  Medicine  Electronically Signed     KMC/MEDQ  D:  01/01/2007 15:50:46  T:  01/01/2007 17:02:55  Job:  [id number]

## 2011-01-03 ENCOUNTER — Ambulatory Visit (INDEPENDENT_AMBULATORY_CARE_PROVIDER_SITE_OTHER): Payer: Medicare Other | Admitting: Family Medicine

## 2011-01-03 ENCOUNTER — Encounter: Payer: Self-pay | Admitting: Family Medicine

## 2011-01-03 VITALS — BP 118/70 | HR 65 | Temp 98.0°F | Ht 70.0 in | Wt 177.5 lb

## 2011-01-03 DIAGNOSIS — N401 Enlarged prostate with lower urinary tract symptoms: Secondary | ICD-10-CM

## 2011-01-03 DIAGNOSIS — Z125 Encounter for screening for malignant neoplasm of prostate: Secondary | ICD-10-CM

## 2011-01-03 DIAGNOSIS — E538 Deficiency of other specified B group vitamins: Secondary | ICD-10-CM

## 2011-01-03 DIAGNOSIS — E785 Hyperlipidemia, unspecified: Secondary | ICD-10-CM

## 2011-01-03 DIAGNOSIS — E039 Hypothyroidism, unspecified: Secondary | ICD-10-CM

## 2011-01-03 DIAGNOSIS — R351 Nocturia: Secondary | ICD-10-CM

## 2011-01-03 DIAGNOSIS — I10 Essential (primary) hypertension: Secondary | ICD-10-CM

## 2011-01-03 DIAGNOSIS — D649 Anemia, unspecified: Secondary | ICD-10-CM

## 2011-01-03 DIAGNOSIS — I498 Other specified cardiac arrhythmias: Secondary | ICD-10-CM

## 2011-01-03 DIAGNOSIS — I471 Supraventricular tachycardia: Secondary | ICD-10-CM

## 2011-01-03 LAB — POCT URINALYSIS DIPSTICK
Bilirubin, UA: NEGATIVE
Blood, UA: NEGATIVE
Glucose, UA: NEGATIVE
Ketones, UA: NEGATIVE
Leukocytes, UA: NEGATIVE
Nitrite, UA: NEGATIVE
Protein, UA: NEGATIVE
Spec Grav, UA: 1.02
Urobilinogen, UA: 0.2
pH, UA: 6

## 2011-01-03 LAB — LIPID PANEL
Cholesterol: 199 mg/dL (ref 0–200)
HDL: 48.5 mg/dL (ref >39.00)
LDL Cholesterol: 124 mg/dL — ABNORMAL HIGH (ref 0–99)
Total CHOL/HDL Ratio: 4
Triglycerides: 134 mg/dL (ref 0.0–149.0)
VLDL: 26.8 mg/dL (ref 0.0–40.0)

## 2011-01-03 LAB — CBC WITH DIFFERENTIAL/PLATELET
Basophils Absolute: 0 10*3/uL (ref 0.0–0.1)
Basophils Relative: 0.7 % (ref 0.0–3.0)
Eosinophils Absolute: 0.2 10*3/uL (ref 0.0–0.7)
Eosinophils Relative: 3 % (ref 0.0–5.0)
HCT: 44.4 % (ref 39.0–52.0)
Hemoglobin: 14.9 g/dL (ref 13.0–17.0)
Lymphocytes Relative: 24 % (ref 12.0–46.0)
Lymphs Abs: 1.5 10*3/uL (ref 0.7–4.0)
MCHC: 33.6 g/dL (ref 30.0–36.0)
MCV: 87 fl (ref 78.0–100.0)
Monocytes Absolute: 0.6 10*3/uL (ref 0.1–1.0)
Monocytes Relative: 10.5 % (ref 3.0–12.0)
Neutro Abs: 3.8 10*3/uL (ref 1.4–7.7)
Neutrophils Relative %: 61.8 % (ref 43.0–77.0)
Platelets: 237 10*3/uL (ref 150.0–400.0)
RBC: 5.1 Mil/uL (ref 4.22–5.81)
RDW: 14.4 % (ref 11.5–14.6)
WBC: 6.1 10*3/uL (ref 4.5–10.5)

## 2011-01-03 LAB — BASIC METABOLIC PANEL
BUN: 13 mg/dL (ref 6–23)
CO2: 26 mEq/L (ref 19–32)
Calcium: 9.5 mg/dL (ref 8.4–10.5)
Chloride: 101 mEq/L (ref 96–112)
Creatinine, Ser: 1 mg/dL (ref 0.4–1.5)
GFR: 75.03 mL/min (ref >60.00)
Glucose, Bld: 108 mg/dL — ABNORMAL HIGH (ref 70–99)
Potassium: 4.3 mEq/L (ref 3.5–5.1)
Sodium: 134 mEq/L — ABNORMAL LOW (ref 135–145)

## 2011-01-03 LAB — HEPATIC FUNCTION PANEL
ALT: 21 U/L (ref 0–53)
AST: 22 U/L (ref 0–37)
Albumin: 4.1 g/dL (ref 3.5–5.2)
Alkaline Phosphatase: 85 U/L (ref 39–117)
Bilirubin, Direct: 0.1 mg/dL (ref 0.0–0.3)
Total Bilirubin: 0.6 mg/dL (ref 0.3–1.2)
Total Protein: 7.1 g/dL (ref 6.0–8.3)

## 2011-01-03 LAB — TSH: TSH: 3.06 u[IU]/mL (ref 0.35–5.50)

## 2011-01-03 LAB — PSA: PSA: 0.79 ng/mL (ref 0.10–4.00)

## 2011-01-03 LAB — VITAMIN B12: Vitamin B-12: 899 pg/mL (ref 211–911)

## 2011-01-03 MED ORDER — METOPROLOL SUCCINATE ER 50 MG PO TB24: 50.0000 mg | ORAL_TABLET | Freq: Every day | ORAL | Status: DC

## 2011-01-03 MED ORDER — LEVOTHYROXINE SODIUM 125 MCG PO TABS: 125.0000 ug | ORAL_TABLET | Freq: Every day | ORAL | Status: DC

## 2011-01-04 LAB — VITAMIN D 25 HYDROXY (VIT D DEFICIENCY, FRACTURES): Vit D, 25-Hydroxy: 36 ng/mL (ref 30–89)

## 2011-01-13 NOTE — Progress Notes (Signed)
Quick Note:  Pt is aware. ______ 

## 2011-01-16 NOTE — Progress Notes (Signed)
Addended by: Harvie Heck. on: 01/16/2011 04:29 PM   Modules accepted: Orders

## 2011-01-16 NOTE — Progress Notes (Signed)
  Subjective:    Patient ID: [name], male    DOB: January 24, 1935, 76 y.o.   MRN: [id number] This 76 year old white married male is in to discuss his medical problems refilled as necessary medicines and obtain indicated lab studiesPatient has macular degeneration with diagnosed in the last year. 10 years ago patient had supraventricular tachycardia and was placed on Toprol and had minimalSentHe continues to have allergic rhinitis tree was 25-50 mg Benadryl bedtime long history of GERD which is controlled with omeprazoleHPI    Review of Systems  Constitutional: Negative.   HENT: Positive for congestion and rhinorrhea.   Eyes:       Macular degeneration and seen by Dr. Nelle Don  Respiratory: Negative.   Cardiovascular:       Supraventricular tachycardia controlled with Toprol 50 mg q. day  Gastrointestinal:       GERD treated with Prilosec  Genitourinary:       Peyronnies dx, no tx, under car Dr. Isabel Caprice  Musculoskeletal: Negative.   Neurological: Negative.   Hematological: Negative.   Psychiatric/Behavioral: Negative.        Objective:   Physical Exam casted since 76-year-old white male who appears younger than his stated age and is in no distress HEENT negative except for pale boggy nasal mucosa with clear drainage, carotid pulses are good and equal bilaterally thyroidectomy  scar no masses palpable Heart good heart sounds no murmurs his inverted T waves in his EKG No cardiomegaly no arrhythmia Abdomen liver spleen kidneys nonpalpable no masses felt bowel sounds are normal aorta percusses normal Genitalia normal testicles normal Rectal exam no myalgias noted prostate enlarged x1 Extremities no M. Or mild is noted Skin negative Neurological no positive findings deep tendon reflexes 2+ bilateral       Assessment & Plan:  Thyroidism to continue Synthroid 125 mcg q. D Allergic rhinitis continue Zyrtec and Benadryl Supraventricular tachycardia continue Toprol Her control was  Prilosec Abnormal EKG 2 schedule routine treadmill stress test

## 2011-01-16 NOTE — Patient Instructions (Signed)
Impression is that you're doing her well and recommend you continue same treatment Will notify you of lab result Refill medications immunizations are up to date

## 2011-01-24 ENCOUNTER — Encounter: Payer: Self-pay | Admitting: Physician Assistant

## 2011-01-24 ENCOUNTER — Encounter: Payer: Self-pay | Admitting: *Deleted

## 2011-01-25 ENCOUNTER — Encounter: Payer: Self-pay | Admitting: Physician Assistant

## 2011-01-25 ENCOUNTER — Ambulatory Visit (INDEPENDENT_AMBULATORY_CARE_PROVIDER_SITE_OTHER): Payer: Medicare Other | Admitting: Physician Assistant

## 2011-01-25 DIAGNOSIS — R9431 Abnormal electrocardiogram [ECG] [EKG]: Secondary | ICD-10-CM

## 2011-01-25 NOTE — Progress Notes (Signed)
Exercise Treadmill Test  Pre-Exercise Testing Evaluation Rhythm: normal sinus  Rate: 64   PR:  .18 QRS:  .08  QT:  .38 QTc: .39     Test  Exercise Tolerance Test Ordering MD: Rickard Patience M.D  Interpreting MD:  Tereso Newcomer PA-C  Unique Test No: 1  Treadmill:  1  Indication for ETT: Abnormal EKG  Contraindication to ETT: No   Stress Modality: exercise - treadmill  Cardiac Imaging Performed: non   Protocol: standard [name] - maximal  Max BP:  175/88  Max MPHR (bpm):  146 85% MPR (bpm):  124  MPHR obtained (bpm): 111 % MPHR obtained:  73  Reached 85% MPHR (min:sec):   Total Exercise Time (min-sec):  5:40  Workload in METS:  9.5 Borg Scale: 17  Reason ETT Terminated:  dyspnea    ST Segment Analysis At Rest: non-specific ST segment slurring With Exercise: non-specific ST changes  Other Information Arrhythmia:  Yes Angina during ETT:  absent (0) Quality of ETT:  non-diagnostic  ETT Interpretation:  normal - no evidence of ischemia by ST analysis at submaximal exercise  Comments: Fair exercise tolerance. No chest pain. Test stopped due to dyspnea. Normal BP response. No ST-T changes at submaximal exercise. PVCs noted at maximal effort with one couplet.  Recommendations: Schedule stress myoview.

## 2011-01-31 ENCOUNTER — Ambulatory Visit (HOSPITAL_COMMUNITY): Payer: Medicare Other | Attending: Family Medicine | Admitting: Radiology

## 2011-01-31 VITALS — Ht 69.0 in | Wt 175.0 lb

## 2011-01-31 DIAGNOSIS — I4949 Other premature depolarization: Secondary | ICD-10-CM

## 2011-01-31 DIAGNOSIS — I491 Atrial premature depolarization: Secondary | ICD-10-CM

## 2011-01-31 DIAGNOSIS — R9431 Abnormal electrocardiogram [ECG] [EKG]: Secondary | ICD-10-CM | POA: Insufficient documentation

## 2011-01-31 DIAGNOSIS — R0989 Other specified symptoms and signs involving the circulatory and respiratory systems: Secondary | ICD-10-CM

## 2011-01-31 DIAGNOSIS — R0609 Other forms of dyspnea: Secondary | ICD-10-CM

## 2011-01-31 MED ORDER — TECHNETIUM TC 99M TETROFOSMIN IV KIT
33.0000 | PACK | Freq: Once | INTRAVENOUS | Status: AC | PRN
  Administered 2011-01-31: 33 via INTRAVENOUS

## 2011-01-31 MED ORDER — TECHNETIUM TC 99M TETROFOSMIN IV KIT
11.0000 | PACK | Freq: Once | INTRAVENOUS | Status: AC | PRN
  Administered 2011-01-31: 11 via INTRAVENOUS

## 2011-01-31 NOTE — Progress Notes (Signed)
[facility] SITE 3 NUCLEAR MED [address] [place] [address] [phone]  Cardiology Nuclear Med Study  [name] is a 76 y.o. male [id number] May 29, 1935   Nuclear Med Background Indication for Stress Test:  Evaluation for Ischemia, Abnormal EKG, 01/25/11 GXT: Unable to obtain THR. History:  '08 MPS:No ischemia, EF=70%, 01/25/11 OZH:YQMVHQ to obtain THR, H/O SVT. Cardiac Risk Factors: History of Smoking, Hypertension, Lipids and TIA  Symptoms:  Dizziness, DOE, Palpitations, Rapid HR and (B) leg cramps, R>L   Nuclear Pre-Procedure Caffeine/Decaff Intake:  None NPO After: 7:00pm   Lungs:  Clear. IV 0.9% NS with Angio Cath:  20g  IV Site: R Antecubital  IV Started by:  Irean Hong, RN  Chest Size (in):  44 Cup Size: n/a  Height: 5\' 9"  (1.753 m)  Weight:  175 lb (79.379 kg)  BMI:  Body mass index is 25.84 kg/(m^2). Tech Comments:  Held toprol 48 hrs    Nuclear Med Study 1 or 2 day study: 1 day  Stress Test Type:  Stress  Reading MD: Charlton Haws, MD  Order Authorizing Provider:  Dianna Limbo, MD  Resting Radionuclide: Technetium 23m Tetrofosmin  Resting Radionuclide Dose: 11 mCi   Stress Radionuclide:  Technetium 49m Tetrofosmin  Stress Radionuclide Dose: 33 mCi           Stress Protocol Rest HR: 68 Stress HR: 126  Rest BP: 114/81 Stress BP: 168/87  Exercise Time (min): 9:00 METS: 10.1   Predicted Max HR: 146 bpm % Max HR: 86.3 bpm Rate Pressure Product: 46962   Dose of Adenosine (mg):  n/a Dose of Lexiscan: n/a mg  Dose of Atropine (mg): n/a Dose of Dobutamine: n/a mcg/kg/min (at max HR)  Stress Test Technologist: Smiley Houseman, CMA-N  Nuclear Technologist:  Domenic Polite, CNMT     Rest Procedure:  Myocardial perfusion imaging was performed at rest 45 minutes following the intravenous administration of Technetium 7m Tetrofosmin.  Rest ECG: Nonspecific T-wave changes, prior SWMI and occasional PAC's.  Stress Procedure:  The patient  exercised for nine minutes on the treadmill utilizing the [name] protocol.  The patient stopped due to fatigue and denied any chest pain.  There were no diagnostic ST-T wave changes, only occasional PAC's and PVC's.  Technetium 9m Tetrofosmin was injected at peak exercise and myocardial perfusion imaging was performed after a brief delay.  Stress ECG: No significant change from baseline ECG  QPS Raw Data Images:  Normal; no motion artifact; normal heart/lung ratio.  Significant motion artifact on both rest and stress images Stress Images:  There is decreased uptake in the inferior wall. Rest Images:  There is decreased uptake in the inferior wall. Subtraction (SDS):  There is a fixed inferior defect that is most consistent with diaphragmatic attenuation. Transient Ischemic Dilatation (Normal <1.22):  1.04 Lung/Heart Ratio (Normal <0.45):  .25  Quantitative Gated Spect Images QGS EDV:  71 ml QGS ESV:  18 ml QGS cine images:  NL LV Function; NL Wall Motion QGS EF: 74%  Impression Exercise Capacity:  Fair exercise capacity. BP Response:  Normal blood pressure response. Clinical Symptoms:  There is dyspnea. ECG Impression:  No significant ST segment change suggestive of ischemia. Comparison with Prior Nuclear Study: No images to compare  Overall Impression:  Thinning of inferior wall not thought to be significant given motion artifact.  Low risk study No ishcemia       Charlton Haws

## 2011-02-06 NOTE — Progress Notes (Signed)
Nuc Med study routed to Dr. [name]. 02/06/11 [name]

## 2011-03-01 ENCOUNTER — Telehealth: Payer: Self-pay | Admitting: *Deleted

## 2011-03-01 ENCOUNTER — Other Ambulatory Visit: Payer: Self-pay | Admitting: Family Medicine

## 2011-03-01 NOTE — Telephone Encounter (Signed)
Pt is calling for stress test results, please.

## 2011-03-09 NOTE — Telephone Encounter (Signed)
done

## 2011-04-27 LAB — DIFFERENTIAL
Basophils Absolute: 0
Basophils Relative: 0
Eosinophils Absolute: 0.2
Eosinophils Relative: 3
Lymphocytes Relative: 31
Lymphs Abs: 1.6
Monocytes Absolute: 0.6
Monocytes Relative: 11
Neutro Abs: 2.8
Neutrophils Relative %: 55

## 2011-04-27 LAB — CBC
HCT: 42.8
Hemoglobin: 14.1
MCHC: 32.8
MCV: 85.5
Platelets: 238
RBC: 5.01
RDW: 13
WBC: 5.2

## 2011-04-27 LAB — URINALYSIS, ROUTINE W REFLEX MICROSCOPIC
Bilirubin Urine: NEGATIVE
Glucose, UA: NEGATIVE
Hgb urine dipstick: NEGATIVE
Ketones, ur: NEGATIVE
Nitrite: NEGATIVE
Protein, ur: NEGATIVE
Specific Gravity, Urine: 1.011
Urobilinogen, UA: 0.2
pH: 6.5

## 2011-04-27 LAB — COMPREHENSIVE METABOLIC PANEL
ALT: 65 — ABNORMAL HIGH
AST: 36
Albumin: 3.4 — ABNORMAL LOW
Alkaline Phosphatase: 84
BUN: 13
CO2: 24
Calcium: 9.1
Chloride: 101
Creatinine, Ser: 0.95
GFR calc Af Amer: >60
GFR calc non Af Amer: >60
Glucose, Bld: 111 — ABNORMAL HIGH
Potassium: 4
Sodium: 135
Total Bilirubin: 0.7
Total Protein: 6.7

## 2011-06-05 ENCOUNTER — Telehealth: Payer: Self-pay | Admitting: Family Medicine

## 2011-06-05 NOTE — Telephone Encounter (Signed)
Will need to wait for appt - to see what dr. Amador Cunas will possibly order - had cpx labs done in June - ins may not cover labs if done before actual appt.

## 2011-06-05 NOTE — Telephone Encounter (Signed)
Husband & wife who were [name] pts wanting to establish with you. They are both on your schedule for 12/12 for 6 mo f/u. They both need labs. I believe the husband, [name], needs a thyroid check. Dr. Charmian Muff notes were unclear. I have set him up with a lab appt on 12/6 that needs lab orders with diagnoses for whatever you feel needs to be ordered. Thank you.

## 2011-07-07 ENCOUNTER — Encounter: Payer: Self-pay | Admitting: Internal Medicine

## 2011-07-07 ENCOUNTER — Ambulatory Visit (INDEPENDENT_AMBULATORY_CARE_PROVIDER_SITE_OTHER): Payer: Medicare Other | Admitting: Internal Medicine

## 2011-07-07 VITALS — BP 118/80 | Temp 97.9°F | Wt 176.0 lb

## 2011-07-07 DIAGNOSIS — E785 Hyperlipidemia, unspecified: Secondary | ICD-10-CM

## 2011-07-07 DIAGNOSIS — Z23 Encounter for immunization: Secondary | ICD-10-CM

## 2011-07-07 DIAGNOSIS — K219 Gastro-esophageal reflux disease without esophagitis: Secondary | ICD-10-CM

## 2011-07-07 DIAGNOSIS — Z8679 Personal history of other diseases of the circulatory system: Secondary | ICD-10-CM

## 2011-07-07 DIAGNOSIS — E039 Hypothyroidism, unspecified: Secondary | ICD-10-CM

## 2011-07-07 DIAGNOSIS — Z0000 Encounter for general adult medical examination without abnormal findings: Secondary | ICD-10-CM

## 2011-07-07 DIAGNOSIS — R55 Syncope and collapse: Secondary | ICD-10-CM

## 2011-07-07 MED ORDER — METOPROLOL SUCCINATE ER 50 MG PO TB24: ORAL_TABLET | ORAL | Status: DC

## 2011-07-07 NOTE — Patient Instructions (Signed)
Limit your sodium (Salt) intake    It is important that you exercise regularly, at least 20 minutes 3 to 4 times per week.  If you develop chest pain or shortness of breath seek  medical attention.  Return in 6 months for follow-up  

## 2011-07-07 NOTE — Progress Notes (Signed)
  Subjective:    Patient ID: [name], male    DOB: [birth year], 76 y.o.   MRN: [id number]  HPI  76 year old patient who is seen to establish. Medical records reviewed. He has a history of syncope secondary to supraventricular tachycardia in 2002. One year ago he had a negative stress test. He has been on beta blocker therapy and more recently has complained of increasing fatigue and at times lightheadedness. He has a history of mild dyslipidemia allergic rhinitis and gastroesophageal reflux disease. In general he does quite well. He denies any cardiopulmonary complaints including palpitations    Review of Systems  Constitutional: Positive for fatigue. Negative for fever, chills and appetite change.  HENT: Negative for hearing loss, ear pain, congestion, sore throat, trouble swallowing, neck stiffness, dental problem, voice change and tinnitus.   Eyes: Negative for pain, discharge and visual disturbance.  Respiratory: Negative for cough, chest tightness, wheezing and stridor.   Cardiovascular: Negative for chest pain, palpitations and leg swelling.  Gastrointestinal: Negative for nausea, vomiting, abdominal pain, diarrhea, constipation, blood in stool and abdominal distention.  Genitourinary: Negative for urgency, hematuria, flank pain, discharge, difficulty urinating and genital sores.  Musculoskeletal: Negative for myalgias, back pain, joint swelling, arthralgias and gait problem.  Skin: Negative for rash.  Neurological: Positive for light-headedness. Negative for dizziness, syncope, speech difficulty, weakness, numbness and headaches.  Hematological: Negative for adenopathy. Does not bruise/bleed easily.  Psychiatric/Behavioral: Negative for behavioral problems and dysphoric mood. The patient is not nervous/anxious.        Objective:   Physical Exam  Constitutional: He is oriented to person, place, and time. He appears well-developed.  HENT:  Head: Normocephalic.  Right Ear: External  ear normal.  Left Ear: External ear normal.  Eyes: Conjunctivae and EOM are normal.  Neck: Normal range of motion.  Cardiovascular: Normal rate and normal heart sounds.   Pulmonary/Chest: Breath sounds normal.  Abdominal: Bowel sounds are normal.  Musculoskeletal: Normal range of motion. He exhibits no edema and no tenderness.  Neurological: He is alert and oriented to person, place, and time.  Psychiatric: He has a normal mood and affect. His behavior is normal.          Assessment & Plan:   History of syncope and PSVT. Patient has had a normal stress test and has been free of palpitations. He may be having some mild beta blocker side effects. We'll decrease metoprolol to 25 mg daily and observe on a lower dose Dyslipidemia Allergic rhinitis BPH   We'll decrease Toprol to 25 mg daily. We'll see in 6 months for his annual exam

## 2011-07-12 ENCOUNTER — Ambulatory Visit: Payer: Medicare Other | Admitting: Internal Medicine

## 2011-07-31 ENCOUNTER — Other Ambulatory Visit (HOSPITAL_COMMUNITY): Payer: Self-pay | Admitting: Gastroenterology

## 2011-07-31 DIAGNOSIS — K449 Diaphragmatic hernia without obstruction or gangrene: Secondary | ICD-10-CM

## 2011-07-31 LAB — HM COLONOSCOPY

## 2011-08-04 ENCOUNTER — Ambulatory Visit (HOSPITAL_COMMUNITY)
Admission: RE | Admit: 2011-08-04 | Discharge: 2011-08-04 | Disposition: A | Discharge status: Home / Self Care | Payer: Medicare Other | Source: Ambulatory Visit | Attending: Gastroenterology | Admitting: Gastroenterology

## 2011-08-04 DIAGNOSIS — K449 Diaphragmatic hernia without obstruction or gangrene: Secondary | ICD-10-CM | POA: Diagnosis not present

## 2011-08-08 DIAGNOSIS — H6120 Impacted cerumen, unspecified ear: Secondary | ICD-10-CM | POA: Diagnosis not present

## 2011-08-08 DIAGNOSIS — K219 Gastro-esophageal reflux disease without esophagitis: Secondary | ICD-10-CM | POA: Diagnosis not present

## 2011-08-18 DIAGNOSIS — K219 Gastro-esophageal reflux disease without esophagitis: Secondary | ICD-10-CM | POA: Diagnosis not present

## 2011-08-18 DIAGNOSIS — K449 Diaphragmatic hernia without obstruction or gangrene: Secondary | ICD-10-CM | POA: Diagnosis not present

## 2011-08-24 DIAGNOSIS — D235 Other benign neoplasm of skin of trunk: Secondary | ICD-10-CM | POA: Diagnosis not present

## 2011-08-30 DIAGNOSIS — K648 Other hemorrhoids: Secondary | ICD-10-CM | POA: Diagnosis not present

## 2011-09-13 DIAGNOSIS — K648 Other hemorrhoids: Secondary | ICD-10-CM | POA: Diagnosis not present

## 2011-09-18 ENCOUNTER — Other Ambulatory Visit: Payer: Self-pay

## 2011-09-18 MED ORDER — LEVOTHYROXINE SODIUM 125 MCG PO TABS: 125.0000 ug | ORAL_TABLET | Freq: Every day | ORAL | Status: DC

## 2011-09-20 ENCOUNTER — Encounter: Payer: Self-pay | Admitting: Internal Medicine

## 2011-09-21 ENCOUNTER — Telehealth (INDEPENDENT_AMBULATORY_CARE_PROVIDER_SITE_OTHER): Payer: Self-pay

## 2011-09-21 ENCOUNTER — Encounter (INDEPENDENT_AMBULATORY_CARE_PROVIDER_SITE_OTHER): Payer: Self-pay | Admitting: General Surgery

## 2011-09-21 ENCOUNTER — Ambulatory Visit (INDEPENDENT_AMBULATORY_CARE_PROVIDER_SITE_OTHER): Payer: Medicare Other | Admitting: General Surgery

## 2011-09-21 DIAGNOSIS — K449 Diaphragmatic hernia without obstruction or gangrene: Secondary | ICD-10-CM | POA: Diagnosis not present

## 2011-09-21 NOTE — Telephone Encounter (Signed)
Written orders placed in INBOX for surgery schedulers(09/21/11) Esophageal Surgery Diet and Post Op instructions with booklet mailed to [name] 09/21/11.

## 2011-09-21 NOTE — Progress Notes (Signed)
Subjective:   Large hiatal hernia  Patient ID: [name], male   DOB: 07-29-1935, 77 y.o.   MRN: [id number]  HPI Patient returns to the office known to me from a previous repair of his right inguinal hernia. He is currently referred by Dr. Kinnie Scales for evaluation of a large hiatal hernia. He has had a known hiatal hernia since 1998 and followed by Dr. Kinnie Scales. He had an endoscopy in 2009 and then do to ongoing symptoms and reflux he recently had reevaluation including endoscopy and barium swallow. His endoscopy showed significant enlargement of his hiatal hernia with a paraesophageal component but no ulceration or other complications. He then had a barium swallow which I have reviewed. This shows a very large hiatal hernia with some sliding but mostly para esophageal component with approximately 2/3 of the stomach herniated into the chest.  The patient has had long-standing reflux and heartburn that is fairly well controlled with PPI. However if he eats certain foods or late at night he will still gets significant heartburn. He denies chest pain. He does have some mild shortness of breath on exertion. He does have some degree of dysphagia on occasion. He and his wife also note that if she eats fatty foods or has an alcoholic beverage at night he will have fairly severe coughing fits that will awaken him. He is here to consider the possibility of repair.  Past Medical History  Diagnosis Date  . Headache   . GERD (gastroesophageal reflux disease)   . Hx of colonic polyps     Dr. Kinnie Scales removed 2 in 2009  . History of cystoscopy 1965  . History of hernia repair     left side:1991and 1978 right side: 2009  . SVT (supraventricular tachycardia)   . Thyroid disease     hypothyroidism  . Hyperlipidemia   . Anemia   . Arthritis   . Hx of benign prostatic hypertrophy    Past Surgical History  Procedure Date  . Thyroidectomy 1967  . Bilateral hernia repair [id number]  . Cystocopy 1965  . Pneumonia  77 years old  . Borkne bones foot 77 yrs old  . Broken finger 77 yrs old  . Skin cancer face last ck on 08-22-07   . Hernia repair 1991, 1998, 2009   Current Outpatient Prescriptions  Medication Sig Dispense Refill  . acetaminophen (TYLENOL) 500 MG tablet Take 1,000 mg by mouth daily.        Marland Kitchen aspirin 81 MG tablet Take 81 mg by mouth daily.        . cetirizine (ZYRTEC) 10 MG tablet Take 10 mg by mouth daily.        Marland Kitchen levothyroxine (SYNTHROID, LEVOTHROID) 125 MCG tablet Take 1 tablet (125 mcg total) by mouth daily.  90 tablet  3  . metoprolol (TOPROL-XL) 50 MG 24 hr tablet One half tablet daily  90 tablet  3  . Multiple Vitamins-Minerals (ICAPS) CAPS Take by mouth.        . calcium carbonate (OS-CAL) 600 MG TABS Take 600 mg by mouth daily.        . diphenhydrAMINE (BENADRYL) 25 mg capsule Take by mouth. Take 1 or 2 tablets as needed       . multivitamin (THERAGRAN) per tablet Take 1 tablet by mouth daily.        Marland Kitchen omeprazole (PRILOSEC) 20 MG capsule Take 20 mg by mouth daily.        . polyethylene glycol powder (GLYCOLAX/MIRALAX) powder       .  pseudoephedrine (SUDAFED) 30 MG tablet Take 60 mg by mouth daily.        . vitamin B-12 (CYANOCOBALAMIN) 100 MCG tablet Take 50 mcg by mouth. Take 1/2 tab by mouth once daily         No Known Allergies History  Substance Use Topics  . Smoking status: Former Games developer  . Smokeless tobacco: Never Used  . Alcohol Use: 0.6 oz/week    1 Glasses of wine per week     every night      Review of Systems  Constitutional: Negative for fever, activity change and fatigue.  HENT: Positive for congestion, trouble swallowing and postnasal drip.   Eyes: Negative.   Respiratory: Positive for cough and shortness of breath. Negative for choking, chest tightness, wheezing and stridor.   Cardiovascular: Negative for chest pain and palpitations.  Gastrointestinal: Negative for vomiting, abdominal pain, diarrhea, constipation and blood in stool.  Musculoskeletal:  Negative.        Objective:   Physical Exam General: Alert, well-developed Caucasian male , in no distress Skin: Warm and dry without rash or infection. HEENT: No palpable masses or thyromegaly. Sclera nonicteric. Pupils equal round and reactive. Oropharynx clear. Lymph nodes: No cervical, supraclavicular, or inguinal nodes palpable. Lungs: Breath sounds clear though somewhat decreased in the left base without increased work of breathing Cardiovascular: Regular rate and rhythm without murmur. No JVD or edema. Peripheral pulses intact. Abdomen: Nondistended. Soft and nontender. No masses palpable. No organomegaly. No palpable hernias. Extremities: No edema or joint swelling or deformity. No chronic venous stasis changes. Neurologic: Alert and fully oriented. Gait normal.     Assessment:     Very large paraesophageal hiatal hernia which has increased significantly over the last several years. He is not markedly symptomatic but has ongoing symptoms of GERD, some degree of dysphagia and what sounds like some occasional nighttime aspiration. We discussed laparoscopic repair of his hernia with Nissen fundoplication in detail including the nature of the procedure, expected recovery, and risks of anesthetic complications, bleeding, infection, visceral injury, recurrent hernia, dysphagia, and gas bloat. All his questions were answered. I think we could improve him symptomatically. He and his wife feel strongly that would like to go ahead with repair particularly due to the fairly rapid increase in the size of the hernia. I think this is indicated. We will go ahead and get this scheduled for him.    Plan:     Laparoscopic repair of large hiatal hernia with a.m. admission

## 2011-09-21 NOTE — Progress Notes (Signed)
Addended by: Glenna Fellows T on: 09/21/2011 01:18 PM   Modules accepted: Orders

## 2011-09-27 ENCOUNTER — Encounter: Payer: Self-pay | Admitting: Internal Medicine

## 2011-09-27 DIAGNOSIS — K648 Other hemorrhoids: Secondary | ICD-10-CM | POA: Diagnosis not present

## 2011-10-03 ENCOUNTER — Encounter (INDEPENDENT_AMBULATORY_CARE_PROVIDER_SITE_OTHER): Payer: Self-pay

## 2011-10-18 ENCOUNTER — Encounter (HOSPITAL_COMMUNITY): Payer: Self-pay | Admitting: Pharmacy Technician

## 2011-10-23 ENCOUNTER — Ambulatory Visit (HOSPITAL_COMMUNITY)
Admission: RE | Admit: 2011-10-23 | Discharge: 2011-10-23 | Disposition: A | Discharge status: Home / Self Care | Payer: Medicare Other | Source: Ambulatory Visit | Attending: General Surgery | Admitting: General Surgery

## 2011-10-23 ENCOUNTER — Encounter (HOSPITAL_COMMUNITY)
Admission: RE | Admit: 2011-10-23 | Discharge: 2011-10-23 | Disposition: A | Discharge status: Home / Self Care | Payer: Medicare Other | Source: Ambulatory Visit | Attending: General Surgery | Admitting: General Surgery

## 2011-10-23 ENCOUNTER — Encounter (HOSPITAL_COMMUNITY): Payer: Self-pay

## 2011-10-23 DIAGNOSIS — G4730 Sleep apnea, unspecified: Secondary | ICD-10-CM | POA: Insufficient documentation

## 2011-10-23 DIAGNOSIS — K449 Diaphragmatic hernia without obstruction or gangrene: Secondary | ICD-10-CM | POA: Diagnosis not present

## 2011-10-23 DIAGNOSIS — Z01812 Encounter for preprocedural laboratory examination: Secondary | ICD-10-CM | POA: Insufficient documentation

## 2011-10-23 DIAGNOSIS — K219 Gastro-esophageal reflux disease without esophagitis: Secondary | ICD-10-CM | POA: Diagnosis not present

## 2011-10-23 DIAGNOSIS — Z01811 Encounter for preprocedural respiratory examination: Secondary | ICD-10-CM | POA: Diagnosis not present

## 2011-10-23 DIAGNOSIS — Z01818 Encounter for other preprocedural examination: Secondary | ICD-10-CM | POA: Insufficient documentation

## 2011-10-23 DIAGNOSIS — E039 Hypothyroidism, unspecified: Secondary | ICD-10-CM | POA: Diagnosis not present

## 2011-10-23 HISTORY — DX: Hypothyroidism, unspecified: E03.9

## 2011-10-23 HISTORY — DX: Sleep apnea, unspecified: G47.30

## 2011-10-23 HISTORY — DX: Shortness of breath: R06.02

## 2011-10-23 HISTORY — DX: Personal history of other diseases of the digestive system: Z87.19

## 2011-10-23 LAB — BASIC METABOLIC PANEL
BUN: 15 mg/dL (ref 6–23)
CO2: 28 mEq/L (ref 19–32)
Calcium: 9.8 mg/dL (ref 8.4–10.5)
Chloride: 98 mEq/L (ref 96–112)
Creatinine, Ser: 1.04 mg/dL (ref 0.50–1.35)
GFR calc Af Amer: 80 mL/min — ABNORMAL LOW (ref >90)
GFR calc non Af Amer: 69 mL/min — ABNORMAL LOW (ref >90)
Glucose, Bld: 92 mg/dL (ref 70–99)
Potassium: 5 mEq/L (ref 3.5–5.1)
Sodium: 134 mEq/L — ABNORMAL LOW (ref 135–145)

## 2011-10-23 LAB — CBC
HCT: 44.6 % (ref 39.0–52.0)
Hemoglobin: 14.8 g/dL (ref 13.0–17.0)
MCH: 28.2 pg (ref 26.0–34.0)
MCHC: 33.2 g/dL (ref 30.0–36.0)
MCV: 85 fL (ref 78.0–100.0)
Platelets: 248 10*3/uL (ref 150–400)
RBC: 5.25 MIL/uL (ref 4.22–5.81)
RDW: 14.7 % (ref 11.5–15.5)
WBC: 6.3 10*3/uL (ref 4.0–10.5)

## 2011-10-23 LAB — SURGICAL PCR SCREEN
MRSA, PCR: NEGATIVE
Staphylococcus aureus: NEGATIVE

## 2011-10-23 MED ORDER — CHLORHEXIDINE GLUCONATE 4 % EX LIQD: 1.0000 "application " | Freq: Once | CUTANEOUS | Status: DC

## 2011-10-23 NOTE — Pre-Procedure Instructions (Signed)
01/31/11 Stress Test in EPIC

## 2011-10-23 NOTE — Patient Instructions (Signed)
20 SEGER JANI  10/23/2011   Your procedure is scheduled on:  11/01/11 [id number]  Report to [facility] at 0630 AM.  Call this number if you have problems the morning of surgery: [phone]   Remember:   Do not eat food:After Midnight.  May have clear liquids:until Midnight .    Take these medicines the morning of surgery with A SIP OF WATER:    Do not wear jewelry, .  Do not wear lotions, powders, or perfumes. .    Do not bring valuables to the hospital.  Contacts, dentures or bridgework may not be worn into surgery.  Leave suitcase in the car. After surgery it may be brought to your room.  For patients admitted to the hospital, checkout time is 11:00 AM the day of discharge.      Special Instructions: CHG Shower Use Special Wash: 1/2 bottle night before surgery and 1/2 bottle morning of surgery. shower chin to toes with CHG.  Wash face and private parts with regular soap.    Please read over the following fact sheets that you were given: MRSA Information, coughing and deep breathing exercises, leg exercises

## 2011-10-31 NOTE — Anesthesia Preprocedure Evaluation (Addendum)
Anesthesia Evaluation  Patient identified by MRN, date of birth, ID band Patient awake    Reviewed: Allergy & Precautions, H&P , NPO status , Patient's Chart, lab work & pertinent test results, reviewed documented beta blocker date and time   Airway Mallampati: III TM Distance: >3 FB Neck ROM: full    Dental  (+) Dental Advisory Given and Caps,    Pulmonary neg pulmonary ROS, shortness of breath and with exertion, sleep apnea ,  breath sounds clear to auscultation  Pulmonary exam normal       Cardiovascular Exercise Tolerance: Good negative cardio ROS  + dysrhythmias Supra Ventricular Tachycardia Rhythm:regular Rate:Normal  Recent stress test was normal   Neuro/Psych  Headaches, negative neurological ROS  negative psych ROS   GI/Hepatic negative GI ROS, Neg liver ROS, hiatal hernia, GERD-  Medicated and Controlled,  Endo/Other  negative endocrine ROSHypothyroidism   Renal/GU negative Renal ROS  negative genitourinary   Musculoskeletal   Abdominal Normal abdominal exam  (+)   Peds  Hematology negative hematology ROS (+)   Anesthesia Other Findings   Reproductive/Obstetrics negative OB ROS                         Anesthesia Physical Anesthesia Plan  ASA: III  Anesthesia Plan: General   Post-op Pain Management:    Induction:   Airway Management Planned: Oral ETT  Additional Equipment:   Intra-op Plan:   Post-operative Plan: Extubation in OR  Informed Consent: I have reviewed the patients History and Physical, chart, labs and discussed the procedure including the risks, benefits and alternatives for the proposed anesthesia with the patient or authorized representative who has indicated his/her understanding and acceptance.   Dental Advisory Given  Plan Discussed with: Surgeon  Anesthesia Plan Comments:         Anesthesia Quick Evaluation

## 2011-11-01 ENCOUNTER — Ambulatory Visit (HOSPITAL_COMMUNITY): Payer: Medicare Other | Admitting: Anesthesiology

## 2011-11-01 ENCOUNTER — Encounter (HOSPITAL_COMMUNITY): Payer: Self-pay | Admitting: *Deleted

## 2011-11-01 ENCOUNTER — Encounter (HOSPITAL_COMMUNITY)
Admission: RE | Disposition: A | Discharge status: Home / Self Care | Payer: Self-pay | Source: Ambulatory Visit | Attending: General Surgery

## 2011-11-01 ENCOUNTER — Inpatient Hospital Stay (HOSPITAL_COMMUNITY)
Admission: RE | Admit: 2011-11-01 | Discharge: 2011-11-03 | DRG: 328 | Disposition: A | Discharge status: Home / Self Care | Payer: Medicare Other | Source: Ambulatory Visit | Attending: General Surgery | Admitting: General Surgery

## 2011-11-01 ENCOUNTER — Encounter (HOSPITAL_COMMUNITY): Payer: Self-pay | Admitting: Anesthesiology

## 2011-11-01 DIAGNOSIS — K219 Gastro-esophageal reflux disease without esophagitis: Secondary | ICD-10-CM | POA: Diagnosis not present

## 2011-11-01 DIAGNOSIS — K449 Diaphragmatic hernia without obstruction or gangrene: Secondary | ICD-10-CM | POA: Diagnosis not present

## 2011-11-01 DIAGNOSIS — G4730 Sleep apnea, unspecified: Secondary | ICD-10-CM | POA: Diagnosis not present

## 2011-11-01 DIAGNOSIS — I498 Other specified cardiac arrhythmias: Secondary | ICD-10-CM | POA: Diagnosis not present

## 2011-11-01 DIAGNOSIS — E039 Hypothyroidism, unspecified: Secondary | ICD-10-CM | POA: Diagnosis present

## 2011-11-01 DIAGNOSIS — K2100 Gastro-esophageal reflux disease with esophagitis, without bleeding: Secondary | ICD-10-CM | POA: Diagnosis not present

## 2011-11-01 HISTORY — PX: LAPAROSCOPIC NISSEN FUNDOPLICATION: SHX1932

## 2011-11-01 SURGERY — FUNDOPLICATION, NISSEN, LAPAROSCOPIC
Anesthesia: General | Site: Abdomen | Wound class: Clean

## 2011-11-01 MED ORDER — CISATRACURIUM BESYLATE 2 MG/ML IV SOLN
INTRAVENOUS | Status: DC | PRN
  Administered 2011-11-01: 2 mg via INTRAVENOUS
  Administered 2011-11-01: 4 mg via INTRAVENOUS
  Administered 2011-11-01: 2 mg via INTRAVENOUS
  Administered 2011-11-01: 8 mg via INTRAVENOUS

## 2011-11-01 MED ORDER — PHENYLEPHRINE HCL 10 MG/ML IJ SOLN
INTRAMUSCULAR | Status: DC | PRN
  Administered 2011-11-01 (×2): 80 ug via INTRAVENOUS

## 2011-11-01 MED ORDER — SUCCINYLCHOLINE CHLORIDE 20 MG/ML IJ SOLN
INTRAMUSCULAR | Status: DC | PRN
  Administered 2011-11-01: 100 mg via INTRAVENOUS

## 2011-11-01 MED ORDER — CEFAZOLIN SODIUM 1-5 GM-% IV SOLN
INTRAVENOUS | Status: AC
  Filled 2011-11-01: qty 50

## 2011-11-01 MED ORDER — LIDOCAINE HCL (CARDIAC) 20 MG/ML IV SOLN
INTRAVENOUS | Status: DC | PRN
  Administered 2011-11-01: 75 mg via INTRAVENOUS

## 2011-11-01 MED ORDER — PROPOFOL 10 MG/ML IV BOLUS
INTRAVENOUS | Status: DC | PRN
  Administered 2011-11-01: 150 mg via INTRAVENOUS

## 2011-11-01 MED ORDER — HEPARIN SODIUM (PORCINE) 5000 UNIT/ML IJ SOLN
5000.0000 [IU] | Freq: Three times a day (TID) | INTRAMUSCULAR | Status: DC
  Administered 2011-11-02 – 2011-11-03 (×4): 5000 [IU] via SUBCUTANEOUS
  Filled 2011-11-01 (×7): qty 1

## 2011-11-01 MED ORDER — GLYCOPYRROLATE 0.2 MG/ML IJ SOLN
INTRAMUSCULAR | Status: DC | PRN
  Administered 2011-11-01: 0.6 mg via INTRAVENOUS
  Administered 2011-11-01 (×2): 0.2 mg via INTRAVENOUS

## 2011-11-01 MED ORDER — MORPHINE SULFATE 2 MG/ML IJ SOLN
2.0000 mg | INTRAMUSCULAR | Status: DC | PRN
  Administered 2011-11-01 – 2011-11-02 (×4): 2 mg via INTRAVENOUS
  Filled 2011-11-01: qty 2
  Filled 2011-11-01 (×3): qty 1

## 2011-11-01 MED ORDER — BUPIVACAINE-EPINEPHRINE (PF) 0.5% -1:200000 IJ SOLN
INTRAMUSCULAR | Status: AC
  Filled 2011-11-01: qty 10

## 2011-11-01 MED ORDER — METOPROLOL TARTRATE 1 MG/ML IV SOLN
5.0000 mg | Freq: Four times a day (QID) | INTRAVENOUS | Status: DC
  Administered 2011-11-01 – 2011-11-03 (×4): 5 mg via INTRAVENOUS
  Filled 2011-11-01 (×12): qty 5

## 2011-11-01 MED ORDER — LACTATED RINGERS IV SOLN: INTRAVENOUS | Status: DC

## 2011-11-01 MED ORDER — LACTATED RINGERS IR SOLN
Status: DC | PRN
  Administered 2011-11-01: 1000 mL

## 2011-11-01 MED ORDER — ACETAMINOPHEN 10 MG/ML IV SOLN
INTRAVENOUS | Status: DC | PRN
  Administered 2011-11-01: 1000 mg via INTRAVENOUS

## 2011-11-01 MED ORDER — KCL IN DEXTROSE-NACL 20-5-0.9 MEQ/L-%-% IV SOLN
INTRAVENOUS | Status: DC
  Administered 2011-11-01: 16:00:00 via INTRAVENOUS
  Administered 2011-11-02: 100 mL via INTRAVENOUS
  Administered 2011-11-02: 23:00:00 via INTRAVENOUS
  Administered 2011-11-02: 100 mL/h via INTRAVENOUS
  Filled 2011-11-01 (×8): qty 1000

## 2011-11-01 MED ORDER — NEOSTIGMINE METHYLSULFATE 1 MG/ML IJ SOLN
INTRAMUSCULAR | Status: DC | PRN
  Administered 2011-11-01: 4 mg via INTRAVENOUS

## 2011-11-01 MED ORDER — ACETAMINOPHEN 10 MG/ML IV SOLN
INTRAVENOUS | Status: AC
  Filled 2011-11-01: qty 100

## 2011-11-01 MED ORDER — BUPIVACAINE-EPINEPHRINE 0.5% -1:200000 IJ SOLN
INTRAMUSCULAR | Status: DC | PRN
  Administered 2011-11-01: 25 mL

## 2011-11-01 MED ORDER — FENTANYL CITRATE 0.05 MG/ML IJ SOLN
INTRAMUSCULAR | Status: DC | PRN
  Administered 2011-11-01 (×3): 50 ug via INTRAVENOUS
  Administered 2011-11-01: 100 ug via INTRAVENOUS
  Administered 2011-11-01 (×3): 50 ug via INTRAVENOUS

## 2011-11-01 MED ORDER — CEFAZOLIN SODIUM 1-5 GM-% IV SOLN
1.0000 g | INTRAVENOUS | Status: AC
  Administered 2011-11-01: 1 g via INTRAVENOUS

## 2011-11-01 MED ORDER — HYDROMORPHONE HCL PF 1 MG/ML IJ SOLN
INTRAMUSCULAR | Status: AC
  Filled 2011-11-01: qty 1

## 2011-11-01 MED ORDER — LACTATED RINGERS IV SOLN
INTRAVENOUS | Status: DC | PRN
  Administered 2011-11-01 (×3): via INTRAVENOUS

## 2011-11-01 MED ORDER — 0.9 % SODIUM CHLORIDE (POUR BTL) OPTIME
TOPICAL | Status: DC | PRN
  Administered 2011-11-01: 1000 mL

## 2011-11-01 MED ORDER — ONDANSETRON HCL 4 MG/2ML IJ SOLN: 4.0000 mg | Freq: Four times a day (QID) | INTRAMUSCULAR | Status: DC | PRN

## 2011-11-01 MED ORDER — ONDANSETRON HCL 4 MG/2ML IJ SOLN
INTRAMUSCULAR | Status: DC | PRN
  Administered 2011-11-01: 4 mg via INTRAVENOUS

## 2011-11-01 MED ORDER — HYDROMORPHONE HCL PF 1 MG/ML IJ SOLN
0.2500 mg | INTRAMUSCULAR | Status: DC | PRN
Start: 2011-11-01 — End: 2011-11-01
  Administered 2011-11-01 (×2): 0.5 mg via INTRAVENOUS

## 2011-11-01 MED ORDER — PANTOPRAZOLE SODIUM 40 MG IV SOLR
40.0000 mg | INTRAVENOUS | Status: DC
  Administered 2011-11-01 – 2011-11-02 (×2): 40 mg via INTRAVENOUS
  Filled 2011-11-01 (×3): qty 40

## 2011-11-01 MED ORDER — LEVOTHYROXINE SODIUM 100 MCG IV SOLR
75.0000 ug | Freq: Every day | INTRAVENOUS | Status: DC
  Administered 2011-11-02: 76 ug via INTRAVENOUS
  Filled 2011-11-01 (×2): qty 3.8

## 2011-11-01 SURGICAL SUPPLY — 58 items
ADH SKN CLS APL DERMABOND .7 (GAUZE/BANDAGES/DRESSINGS) ×3
APL SKNCLS STERI-STRIP NONHPOA (GAUZE/BANDAGES/DRESSINGS)
APPLIER CLIP 5 13 M/L LIGAMAX5 (MISCELLANEOUS)
APPLIER CLIP ROT 10 11.4 M/L (STAPLE)
APR CLP MED LRG 11.4X10 (STAPLE)
APR CLP MED LRG 5 ANG JAW (MISCELLANEOUS)
BENZOIN TINCTURE PRP APPL 2/3 (GAUZE/BANDAGES/DRESSINGS) ×1 IMPLANT
CANISTER SUCTION 2500CC (MISCELLANEOUS) ×1 IMPLANT
CLIP APPLIE 5 13 M/L LIGAMAX5 (MISCELLANEOUS) IMPLANT
CLIP APPLIE ROT 10 11.4 M/L (STAPLE) IMPLANT
CLOTH BEACON ORANGE TIMEOUT ST (SAFETY) ×2 IMPLANT
DECANTER SPIKE VIAL GLASS SM (MISCELLANEOUS) ×2 IMPLANT
DERMABOND ADVANCED (GAUZE/BANDAGES/DRESSINGS) ×3
DERMABOND ADVANCED .7 DNX12 (GAUZE/BANDAGES/DRESSINGS) IMPLANT
DEVICE SUT QUICK LOAD TK 5 (STAPLE) ×9 IMPLANT
DEVICE SUT TI-KNOT TK 5X26 (MISCELLANEOUS) ×1 IMPLANT
DEVICE SUTURE ENDOST 10MM (ENDOMECHANICALS) ×2 IMPLANT
DEVICE TROCAR PUNCTURE CLOSURE (ENDOMECHANICALS) IMPLANT
DISSECTOR BLUNT TIP ENDO 5MM (MISCELLANEOUS) ×1 IMPLANT
DRAIN PENROSE 18X1/2 LTX STRL (DRAIN) ×2 IMPLANT
DRAPE LAPAROSCOPIC ABDOMINAL (DRAPES) ×2 IMPLANT
ELECT REM PT RETURN 9FT ADLT (ELECTROSURGICAL) ×2
ELECTRODE REM PT RTRN 9FT ADLT (ELECTROSURGICAL) ×1 IMPLANT
FELT TEFLON 4 X1 (Mesh General) ×2 IMPLANT
GOWN STRL NON-REIN LRG LVL3 (GOWN DISPOSABLE) ×2 IMPLANT
GOWN STRL REIN XL XLG (GOWN DISPOSABLE) ×4 IMPLANT
KIT BASIN OR (CUSTOM PROCEDURE TRAY) ×2 IMPLANT
MARKER SKIN DUAL TIP RULER LAB (MISCELLANEOUS) ×1 IMPLANT
MESH HERNIA 7X10 (Mesh General) ×1 IMPLANT
NS IRRIG 1000ML POUR BTL (IV SOLUTION) ×2 IMPLANT
PENCIL BUTTON HOLSTER BLD 10FT (ELECTRODE) IMPLANT
SCALPEL HARMONIC ACE (MISCELLANEOUS) ×2 IMPLANT
SET IRRIG TUBING LAPAROSCOPIC (IRRIGATION / IRRIGATOR) ×2 IMPLANT
SLEEVE ADV FIXATION 5X100MM (TROCAR) ×3 IMPLANT
SLEEVE SURGEON STRL (DRAPES) ×1 IMPLANT
SOLUTION ANTI FOG 6CC (MISCELLANEOUS) ×2 IMPLANT
STAPLER VISISTAT 35W (STAPLE) IMPLANT
STRIP CLOSURE SKIN 1/2X4 (GAUZE/BANDAGES/DRESSINGS) IMPLANT
SUT MNCRL AB 4-0 PS2 18 (SUTURE) ×2 IMPLANT
SUT SURGIDAC NAB ES-9 0 48 120 (SUTURE) ×13 IMPLANT
SUT VIC AB 1 CT1 27 (SUTURE)
SUT VIC AB 1 CT1 27XBRD ANTBC (SUTURE) IMPLANT
SUT VIC AB 2-0 SH 27 (SUTURE)
SUT VIC AB 2-0 SH 27X BRD (SUTURE) IMPLANT
SUT VIC AB 3-0 SH 27 (SUTURE) ×10
SUT VIC AB 3-0 SH 27XBRD (SUTURE) IMPLANT
TIP INNERVISION DETACH 40FR (MISCELLANEOUS) IMPLANT
TIP INNERVISION DETACH 50FR (MISCELLANEOUS) IMPLANT
TIP INNERVISION DETACH 56FR (MISCELLANEOUS) ×1 IMPLANT
TIPS INNERVISION DETACH 40FR (MISCELLANEOUS)
TOWEL OR 17X26 10 PK STRL BLUE (TOWEL DISPOSABLE) ×6 IMPLANT
TRAY FOLEY CATH 14FRSI W/METER (CATHETERS) ×2 IMPLANT
TRAY LAP CHOLE (CUSTOM PROCEDURE TRAY) ×2 IMPLANT
TROCAR ADV FIXATION 11X100MM (TROCAR) ×2 IMPLANT
TROCAR ADV FIXATION 5X100MM (TROCAR) ×2 IMPLANT
TROCAR XCEL NON-BLD 11X100MML (ENDOMECHANICALS) IMPLANT
TROCAR XCEL NON-BLD 5MMX100MML (ENDOMECHANICALS) IMPLANT
TUBING INSUFFLATION 10FT LAP (TUBING) ×2 IMPLANT

## 2011-11-01 NOTE — Transfer of Care (Signed)
Immediate Anesthesia Transfer of Care Note  Patient: [name]  Procedure(s) Performed: Procedure(s) (LRB): LAPAROSCOPIC NISSEN FUNDOPLICATION (N/A)  Patient Location: PACU  Anesthesia Type: General  Level of Consciousness: awake, oriented and patient cooperative  Airway & Oxygen Therapy: Patient Spontanous Breathing and Patient connected to face mask oxygen  Post-op Assessment: Report given to PACU RN and Post -op Vital signs reviewed and stable  Post vital signs: Reviewed and stable  Complications: No apparent anesthesia complications

## 2011-11-01 NOTE — Progress Notes (Signed)
Report given to Alden Benjamin, R.N. For lunch relief.

## 2011-11-01 NOTE — Op Note (Signed)
Preoperative Diagnosis: Large hiatal hernia with Gastroesophageal Reflux Disease  Postoprative Diagnosis: Large hiatal hernia with Gastroesophageal Reflux Disease  Procedure: Procedure(s): LAPAROSCOPIC NISSEN FUNDOPLICATION and repair of hiatal hernia   Surgeon: Glenna Fellows T   Assistants: Almond Lint  Anesthesia:  General endotracheal anesthesiaDiagnos  Indications:   Patient is a 77 year old male with a known hiatal hernia. Recent evaluation has revealed markedly increased in size with now the majority of the stomach in the chest with a paraesophageal component. The patient has increasing dysphagia and regurgitation and GERD. After extensive discussion in the office detailed elsewhere we elect to proceed with laparoscopic repair with Nissen fundoplication.  Procedure Detail:  Patient is brought to the operating room, placed in the supine position on the operating table, and general endotracheal anesthesia induced. He received preoperative IV antibiotics. PAS were in place. The abdomen was widely sterilely prepped and draped. Patient time out was performed the correct procedure verified. Access was obtained with a 5 mm Optiview trocar in the left upper quadrant without difficulty and pneumoperitoneum established. Under direct vision a 5 mm trocar was placed laterally in the right upper quadrant a 12 mm trocar more medially in the right upper quadrant and 11 mm trocar just to the left of the umbilicus for the camera port and an additional 5 mm trocar laterally in the left upper quadrant. The Nathanson retractor was placed via a 5 mm subxiphoid site and the left lobe of the liver elevated with excellent exposure of the stomach and hiatus. There was noted to be a very large hiatal hernia with the majority of the stomach in the thoracic cavity. The stomach was retracted inferiorly and the pars flaccida opened along an avascular area. Beginning along the midportion of the right crus the hernia  sac was incised with the harmonic scalpel this dissection was carried up over the anterior portion of the hiatus and down to about the midportion of the left crus. The hernia sac was then extensively stripped down and out of the mediastinum using careful blunt dissection and the harmonic scalpel. The esophagus was identified and carefully protected. The hernia sac was fully taken down out of the mediastinum anteriorly. Following this the greater curve was exposed near the fundus and the short gastric vessels were taken down with the harmonic scalpel completely mobilizing the fundus up to the EG junction and mobilizing further adhesions along the posterior left crus down to the confluence of the crura and further taking the hernia sac inferiorly.I then under direct vision was able to develop a window behind the esophagus and passed a Penrose drain which was used to elevate the GE junction and final attachments down along the posterior right crus were mobilized completely dissecting the hiatus. The large hernia sac was excised with the harmonic scalpel and removed. At this point the esophagus was fully mobilized well up into the mediastinum and the EG junction lay within the abdominal cavity without any tension. A posterior crural repair was then performed with pledgeted 2-0 Ethibond sutures incorporating large bites of each crus. This was done without any undue tension. A lighted 56 bougie dilator was passed orally down through the EG junction without difficulty to size the hiatal repair. Following this crural repair the bougie dilator was retracted back into the upper esophagus. A Cook Biodesign hiatal hernia patch was moistened and introduced into the abdominal cavity and positioned over the crural repair and around the esophagus. It was sutured to the diaphragm circumferentially with 6 interrupted 3-0  Vicryl sutures. Following this a mobile portion of the fundus was brought back behind the esophagus for the wrap  and a 360 fundic wrap was constructed confirming that the wrap was contiguous posteriorly. A bougie dilator was reintroduced through the esophagus and the wrap was sutured into place with 3 interrupted 2-0 Ethibond sutures incorporating small bites of the anterior esophagus. This was approximately 3 cm wrap. The bougie dilator was then removed and the wrap was seen to be nice and floppy. 2 2-0 Ethibond sutures were then used to secure the wrap up to either side of the hiatus. The abdomen was inspected for hemostasis and there was no bleeding or evidence of trocar injury or other problem. All CO2 was evacuated after removing the Davis retractor under direct vision at all trochars removed. Skin incisions were closed with subcuticular Monocryl and Dermabond.  Findings: Large hiatal hernia  Estimated Blood Loss:  Minimal         Drains: none  Blood Given: none          Specimens: none        Complications:  * No complications entered in OR log *         Disposition: PACU - hemodynamically stable.         Condition: stable  Mariella Saa MD, FACS  11/01/2011, 11:55 AM

## 2011-11-01 NOTE — Preoperative (Signed)
Beta Blockers   Reason not to administer Beta Blockers:Took Metoprolol 25mg  po this am.

## 2011-11-01 NOTE — Anesthesia Procedure Notes (Addendum)
Procedure Name: Intubation Date/Time: 11/01/2011 8:49 AM Performed by: Elyn Peers Pre-anesthesia Checklist: Patient identified, Emergency Drugs available, Suction available and Patient being monitored Patient Re-evaluated:Patient Re-evaluated prior to inductionOxygen Delivery Method: Circle system utilized Preoxygenation: Pre-oxygenation with 100% oxygen Intubation Type: Rapid sequence, Cricoid Pressure applied and IV induction Laryngoscope Size: Miller and 3 Grade View: Grade II Tube type: Oral Tube size: 7.5 mm Number of attempts: 1 Airway Equipment and Method: Stylet Placement Confirmation: ETT inserted through vocal cords under direct vision,  positive ETCO2 and breath sounds checked- equal and bilateral Secured at: 22 cm Tube secured with: Tape Dental Injury: Teeth and Oropharynx as per pre-operative assessment

## 2011-11-01 NOTE — Anesthesia Postprocedure Evaluation (Signed)
  Anesthesia Post-op Note  Patient: [name]  Procedure(s) Performed: Procedure(s) (LRB): LAPAROSCOPIC NISSEN FUNDOPLICATION (N/A)  Patient Location: PACU  Anesthesia Type: General  Level of Consciousness: awake and alert   Airway and Oxygen Therapy: Patient Spontanous Breathing  Post-op Pain: mild  Post-op Assessment: Post-op Vital signs reviewed, Patient's Cardiovascular Status Stable, Respiratory Function Stable, Patent Airway and No signs of Nausea or vomiting  Post-op Vital Signs: stable  Complications: No apparent anesthesia complications

## 2011-11-01 NOTE — H&P (Signed)
  Subjective:    [name] is a 77 y.o. male who presents for elective repair of a large hiatal hernia. He has increasing chest discomfort and reflux  Review of Systems Respiratory: positive for dyspnea on exertion    Objective:   BP 115/77  Pulse 61  Temp(Src) 97.1 F (36.2 C) (Oral)  Resp 18  SpO2 95%  General:  alert and cooperative Skin:  normal Lymph Nodes:  Cervical, supraclavicular, and axillary nodes normal. Lungs:  clear to auscultation bilaterally Heart:  regular rate and rhythm Abdomen: soft, non-tender; bowel sounds normal; no masses,  no organomegaly Extremities:  extremities normal, atraumatic, no cyanosis or edema Neurologic:  negative Psychiatric:  normal mood, behavior, speech, dress, and thought processes    Assessment: Large symptomatic hiatal hernia   Plan: Laparoscopic repair large hiatal hernia and Nissen fundoplication   1. Discussed the risk of surgery,  and the risks of general anesthetic including MI, CVA, sudden death or even reaction to anesthetic medications. The patient understands the risks, any and all questions were answered to the patient's satisfaction.  Mariella Saa MD, FACS  11/01/2011, 8:41 AM

## 2011-11-02 LAB — BASIC METABOLIC PANEL
BUN: 9 mg/dL (ref 6–23)
CO2: 28 mEq/L (ref 19–32)
Calcium: 8.5 mg/dL (ref 8.4–10.5)
Chloride: 100 mEq/L (ref 96–112)
Creatinine, Ser: 0.95 mg/dL (ref 0.50–1.35)
GFR calc Af Amer: >90 mL/min (ref >90)
GFR calc non Af Amer: 80 mL/min — ABNORMAL LOW (ref >90)
Glucose, Bld: 137 mg/dL — ABNORMAL HIGH (ref 70–99)
Potassium: 3.9 mEq/L (ref 3.5–5.1)
Sodium: 133 mEq/L — ABNORMAL LOW (ref 135–145)

## 2011-11-02 LAB — CBC
HCT: 39.8 % (ref 39.0–52.0)
Hemoglobin: 13 g/dL (ref 13.0–17.0)
MCH: 28 pg (ref 26.0–34.0)
MCHC: 32.7 g/dL (ref 30.0–36.0)
MCV: 85.6 fL (ref 78.0–100.0)
Platelets: 174 10*3/uL (ref 150–400)
RBC: 4.65 MIL/uL (ref 4.22–5.81)
RDW: 14.9 % (ref 11.5–15.5)
WBC: 8.2 10*3/uL (ref 4.0–10.5)

## 2011-11-02 MED ORDER — OXYCODONE-ACETAMINOPHEN 5-325 MG/5ML PO SOLN
5.0000 mL | ORAL | Status: DC | PRN
  Administered 2011-11-02 (×2): 5 mL via ORAL
  Administered 2011-11-02 (×2): 10 mL via ORAL
  Filled 2011-11-02: qty 10
  Filled 2011-11-02 (×2): qty 5
  Filled 2011-11-02: qty 10

## 2011-11-02 NOTE — Progress Notes (Signed)
Utilization review completed.  

## 2011-11-02 NOTE — Progress Notes (Signed)
Paged [facility] regarding patient's request for something oral for pain control and headache, orders received and entered [name], Christabella Alvira N 11-02-11 11:01am

## 2011-11-02 NOTE — Progress Notes (Signed)
Patient is alert and oriented, vital signs are stable, pt tolerating clear liquid diet without complaints of nausea or vomiting, incisions to abdomen are well approximated with dermabond and within normal limits, patient's bowel sounds are hypoactive, patient has voided since foley removal, patient medicated with roxicet twice this shift and prefers this medication over the morphine, patient up and ambulating in the hall will continue to monitor [name], [name] N 11-02-11 17:33pm

## 2011-11-02 NOTE — Progress Notes (Signed)
Patient ID: [name], male   DOB: 10-Apr-1935, 77 y.o.   MRN: [id number] 1 Day Post-Op  Subjective: Some abd pain, not severe, relieved with meds.  No nausea  Objective: Vital signs in last 24 hours: Temp:  [98 F (36.7 C)-99.2 F (37.3 C)] 99.1 F (37.3 C) (04/04 0604) Pulse Rate:  [58-81] 61  (04/04 0604) Resp:  [10-20] 20  (04/04 0604) BP: (94-142)/(60-99) 94/60 mmHg (04/04 0604) SpO2:  [94 %-100 %] 95 % (04/04 0604) Weight:  [173 lb 11.2 oz (78.79 kg)] 173 lb 11.2 oz (78.79 kg) (04/03 1400)    Intake/Output from previous day: 04/03 0701 - 04/04 0700 In: 2650 [I.V.:2650] Out: 1525 [Urine:1525] Intake/Output this shift:    General appearance: alert and no distress GI: normal findings: soft, non-tender Incision/Wound: Clean and dry  Lab Results:   [facility] 11/02/11 0428  WBC 8.2  HGB 13.0  HCT 39.8  PLT 174   BMET  Basename 11/02/11 0428  NA 133*  K 3.9  CL 100  CO2 28  GLUCOSE 137*  BUN 9  CREATININE 0.95  CALCIUM 8.5     Studies/Results: No results found.  Anti-infectives: Anti-infectives     Start     Dose/Rate Route Frequency Ordered Stop   11/01/11 0632   ceFAZolin (ANCEF) IVPB 1 g/50 mL premix        1 g 100 mL/hr over 30 Minutes Intravenous 60 min pre-op 11/01/11 4098 11/01/11 0852          Assessment/Plan: s/p Procedure(s): LAPAROSCOPIC NISSEN FUNDOPLICATION Doing well Start CL diet   LOS: 1 day    Naeem Quillin T 11/02/2011

## 2011-11-03 ENCOUNTER — Telehealth (INDEPENDENT_AMBULATORY_CARE_PROVIDER_SITE_OTHER): Payer: Self-pay | Admitting: General Surgery

## 2011-11-03 MED ORDER — OXYCODONE-ACETAMINOPHEN 5-325 MG/5ML PO SOLN: 5.0000 mL | ORAL | Status: AC | PRN

## 2011-11-03 MED ORDER — BIOTENE DRY MOUTH MT LIQD
15.0000 mL | Freq: Two times a day (BID) | OROMUCOSAL | Status: DC
  Administered 2011-11-03: 15 mL via OROMUCOSAL

## 2011-11-03 NOTE — Progress Notes (Signed)
Patient ID: [name], male   DOB: [birth year], 77 y.o.   MRN: [id number] 2 Days Post-Op  Subjective: Feels well and wants to go home. Tolerating clear liquids without difficulty. No significant pain and the nausea. He has had flatus.  Objective: Vital signs in last 24 hours: Temp:  [97.4 F (36.3 C)-99.7 F (37.6 C)] 98.4 F (36.9 C) (04/05 0540) Pulse Rate:  [61-97] 97  (04/05 0540) Resp:  [18-20] 20  (04/05 0540) BP: (84-153)/(52-77) 101/67 mmHg (04/05 0540) SpO2:  [91 %-96 %] 95 % (04/05 0540) FiO2 (%):  [32 %] 32 % (04/04 1146) Last BM Date: 10/31/11  Intake/Output from previous day: 04/04 0701 - 04/05 0700 In: 3487 [P.O.:960; I.V.:2517; IV Piggyback:10] Out: 2400 [Urine:2400] Intake/Output this shift:    General appearance: alert and no distress GI: normal findings: soft, non-tender  Lab Results:   [facility] 11/02/11 0428  WBC 8.2  HGB 13.0  HCT 39.8  PLT 174   BMET  Basename 11/02/11 0428  NA 133*  K 3.9  CL 100  CO2 28  GLUCOSE 137*  BUN 9  CREATININE 0.95  CALCIUM 8.5     Studies/Results: No results found.  Anti-infectives: Anti-infectives     Start     Dose/Rate Route Frequency Ordered Stop   11/01/11 0865   ceFAZolin (ANCEF) IVPB 1 g/50 mL premix        1 g 100 mL/hr over 30 Minutes Intravenous 60 min pre-op 11/01/11 7846 11/01/11 0852          Assessment/Plan: s/p Procedure(s): LAPAROSCOPIC NISSEN FUNDOPLICATION Doing very well postoperatively without complication identified. Discharge home today   LOS: 2 days    [name] T 11/03/2011

## 2011-11-03 NOTE — Discharge Summary (Signed)
Patient ID: [name] [id number] 77 y.o. [birth year]  11/01/2011  Discharge date and time: 11/03/2011   Admitting Physician: Glenna Fellows T  Discharge Physician: Glenna Fellows T  Admission Diagnoses: Large hiatal hernia with Gastroesophageal Reflux Disease  Discharge Diagnoses: Same  Operations: Procedure(s): LAPAROSCOPIC NISSEN FUNDOPLICATION  Admission Condition: good  Discharged Condition: good  Indication for Admission: Patient is a 77 year old male who has been found to have an enlarging and now very significant paraesophageal and sliding hiatal hernia with significant reflux. Options were discussed as an outpatient and we have elected to proceed with laparoscopic repair and Nissen fundoplication.  Hospital Course: Patient was admitted on the morning of this procedure and underwent an uneventful laparoscopic repair of his very large hiatal hernia with approximately 2/3 of the stomach in the chest as well as Nissen fundoplication. He tolerated the procedure well and there were no complications. He had mild pain. He was started on a clear liquid diet on the first postoperative day which he tolerated without difficulty. On the second postoperative day he had no nausea and was passing flatus. No significant pain. Abdomen soft and nontender. He is discharged home at this time.  Disposition: Home  Patient Instructions:   [place], [place]  Home Medication Instructions [id number]   Printed on:11/03/11 9562  Medication Information                    aspirin 81 MG tablet Take 81 mg by mouth every morning.            calcium carbonate (OS-CAL) 600 MG TABS Take 600 mg by mouth every morning.            cetirizine (ZYRTEC) 10 MG tablet Take 10 mg by mouth every evening.            multivitamin (THERAGRAN) per tablet Take 1 tablet by mouth daily.            omeprazole (PRILOSEC) 20 MG capsule Take 20 mg by mouth every morning.            pseudoephedrine (SUDAFED)  30 MG tablet Take 60 mg by mouth every 4 (four) hours as needed. For sinus congestion           acetaminophen (TYLENOL) 500 MG tablet Take 1,000 mg by mouth every 4 (four) hours as needed. For pain           Multiple Vitamins-Minerals (ICAPS) CAPS Take by mouth every morning.            polyethylene glycol powder (GLYCOLAX/MIRALAX) powder            Multiple Vitamins-Minerals (ICAPS AREDS FORMULA PO) Take 1 tablet by mouth 2 (two) times daily.           vitamin B-12 (CYANOCOBALAMIN) 1000 MCG tablet Take 500 mcg by mouth daily. Patient states that he takes half tablet once a day           metoprolol succinate (TOPROL-XL) 50 MG 24 hr tablet Take 25 mg by mouth every morning. One half tablet daily           levothyroxine (SYNTHROID, LEVOTHROID) 125 MCG tablet Take 125 mcg by mouth every morning.           diphenhydrAMINE (SOMINEX) 25 MG tablet Take 25 mg by mouth as needed.           oxyCODONE-acetaminophen (ROXICET) 5-325 MG/5ML solution Take 5-10 mLs by mouth every 4 (four) hours as needed.  Activity: activity as tolerated Diet: liquid or pured diet only Wound Care: none needed  Follow-up:  With Dr. Johna Sheriff in 2 weeks.  Signed: Mariella Saa MD, FACS  11/03/2011, 8:51 AM

## 2011-11-03 NOTE — Discharge Instructions (Signed)
Call [facility] at [phone] for problems or concerns. Liquid diet only until office visit. Activity as tolerated. May shower.

## 2011-11-07 ENCOUNTER — Telehealth (INDEPENDENT_AMBULATORY_CARE_PROVIDER_SITE_OTHER): Payer: Self-pay | Admitting: General Surgery

## 2011-11-07 NOTE — Telephone Encounter (Signed)
Patient post op appointment was made on 11/01/11 for 11/23/11 @ 9:15 am w/Dr. Johna Sheriff. Follow up call made to Mr. Kupper to give post op appt date & time.

## 2011-11-15 ENCOUNTER — Encounter (HOSPITAL_COMMUNITY): Payer: Self-pay | Admitting: General Surgery

## 2011-11-23 ENCOUNTER — Ambulatory Visit (INDEPENDENT_AMBULATORY_CARE_PROVIDER_SITE_OTHER): Payer: Medicare Other | Admitting: General Surgery

## 2011-11-23 ENCOUNTER — Encounter (INDEPENDENT_AMBULATORY_CARE_PROVIDER_SITE_OTHER): Payer: Self-pay | Admitting: General Surgery

## 2011-11-23 VITALS — BP 104/68 | HR 68 | Temp 98.2°F | Resp 18 | Ht 70.0 in | Wt 165.4 lb

## 2011-11-23 DIAGNOSIS — K449 Diaphragmatic hernia without obstruction or gangrene: Secondary | ICD-10-CM

## 2011-11-23 NOTE — Progress Notes (Signed)
History: Patient returns 3 weeks following laparoscopic repair of a giant hiatal hernia with paraesophageal component. He generally is to get along well. He still has some dysphagia to more solid or toe foods but this seems to be getting a little better. He notices a sort of belching at night but I suspect maybe a bit of gas bloat. He's not having reflux or heartburn.  Exam: General: Appears well Abdomen: Soft and nontender. Wounds all healing well.  Assessment and plan: Doing well following laparoscopic repair of giant hiatal hernia. He has mild dysphagia but he continued to improve as well as probably some mild gas bloat but I would also expect to improve. He will return in 6 weeks.

## 2011-11-23 NOTE — Patient Instructions (Signed)
No restrictions on physical activity 

## 2012-01-04 ENCOUNTER — Encounter: Payer: Self-pay | Admitting: Internal Medicine

## 2012-01-04 ENCOUNTER — Ambulatory Visit (INDEPENDENT_AMBULATORY_CARE_PROVIDER_SITE_OTHER): Payer: Medicare Other | Admitting: Internal Medicine

## 2012-01-04 ENCOUNTER — Ambulatory Visit (INDEPENDENT_AMBULATORY_CARE_PROVIDER_SITE_OTHER): Payer: Medicare Other | Admitting: General Surgery

## 2012-01-04 VITALS — BP 126/80 | HR 68 | Temp 98.9°F | Resp 16 | Ht 70.0 in | Wt 163.0 lb

## 2012-01-04 VITALS — BP 112/80 | Temp 98.1°F | Wt 164.0 lb

## 2012-01-04 DIAGNOSIS — K219 Gastro-esophageal reflux disease without esophagitis: Secondary | ICD-10-CM

## 2012-01-04 DIAGNOSIS — E039 Hypothyroidism, unspecified: Secondary | ICD-10-CM | POA: Diagnosis not present

## 2012-01-04 DIAGNOSIS — E785 Hyperlipidemia, unspecified: Secondary | ICD-10-CM

## 2012-01-04 DIAGNOSIS — G4733 Obstructive sleep apnea (adult) (pediatric): Secondary | ICD-10-CM | POA: Insufficient documentation

## 2012-01-04 DIAGNOSIS — K449 Diaphragmatic hernia without obstruction or gangrene: Secondary | ICD-10-CM

## 2012-01-04 LAB — LIPID PANEL
Cholesterol: 165 mg/dL (ref 0–200)
HDL: 47.3 mg/dL (ref >39.00)
LDL Cholesterol: 98 mg/dL (ref 0–99)
Total CHOL/HDL Ratio: 3
Triglycerides: 101 mg/dL (ref 0.0–149.0)
VLDL: 20.2 mg/dL (ref 0.0–40.0)

## 2012-01-04 LAB — TSH: TSH: 0.5 u[IU]/mL (ref 0.35–5.50)

## 2012-01-04 MED ORDER — METOPROLOL SUCCINATE ER 25 MG PO TB24: 25.0000 mg | ORAL_TABLET | Freq: Every day | ORAL | Status: DC

## 2012-01-04 MED ORDER — LEVOTHYROXINE SODIUM 125 MCG PO TABS: 125.0000 ug | ORAL_TABLET | ORAL | Status: DC

## 2012-01-04 NOTE — Progress Notes (Signed)
History: Patient returns now 6 weeks following laparoscopic repair of his large paraesophageal hiatal hernia. Overall I think he is getting along well. He did have a period of time where he was feeling a lot of reflux and some regurgitation but this has stopped. More recently he has had some belching and upper abdominal gas but this is also improving. He is swallowing solid food without difficulty. He feels his breathing is improved since the surgery.  Exam: Gen.: He appears well Abdomen: Soft and nontender her wounds all well healed  Assessment and plan: Doing well following arthroscopic repair of large paraesophageal hiatal hernia. He has some expected gas bloat. I recommended SIMETHICONE and told them that should improve over time. He will return in 3-4 months for more long-term followup.

## 2012-01-04 NOTE — Patient Instructions (Signed)
Limit your sodium (Salt) intake    It is important that you exercise regularly, at least 20 minutes 3 to 4 times per week.  If you develop chest pain or shortness of breath seek  medical attention.  Return in 6 months for follow-up  

## 2012-01-04 NOTE — Patient Instructions (Signed)
Use Gas-X or simethicone as needed for gas

## 2012-01-04 NOTE — Progress Notes (Signed)
  Subjective:    Patient ID: [name], male    DOB: [birth year], 77 y.o.   MRN: [id number]  HPI  77 year old patient who is seen today in followup. He is approximately 2 months status post laparoscopic Nissan fundoplication for a large hiatal  hernia with refractory reflux symptoms. He has done well postoperatively. He is scheduled for surgical followup soon He has a history of OSA and underwent a split night sleep study 5 years ago that revealed moderate OSA. He has been fitted with a oral device or wishes to consider CPAP. He does describe some afternoon hypersomnolence. He has a history of hypothyroidism and no recent TSH.    Review of Systems  Constitutional: Negative for fever, chills, appetite change and fatigue.  HENT: Negative for hearing loss, ear pain, congestion, sore throat, trouble swallowing, neck stiffness, dental problem, voice change and tinnitus.   Eyes: Negative for pain, discharge and visual disturbance.  Respiratory: Negative for cough, chest tightness, wheezing and stridor.   Cardiovascular: Negative for chest pain, palpitations and leg swelling.  Gastrointestinal: Negative for nausea, vomiting, abdominal pain, diarrhea, constipation, blood in stool and abdominal distention.  Genitourinary: Negative for urgency, hematuria, flank pain, discharge, difficulty urinating and genital sores.  Musculoskeletal: Negative for myalgias, back pain, joint swelling, arthralgias and gait problem.  Skin: Negative for rash.  Neurological: Negative for dizziness, syncope, speech difficulty, weakness, numbness and headaches.  Hematological: Negative for adenopathy. Does not bruise/bleed easily.  Psychiatric/Behavioral: Negative for behavioral problems and dysphoric mood. The patient is not nervous/anxious.        Objective:   Physical Exam  Constitutional: He is oriented to person, place, and time. He appears well-developed.  HENT:  Head: Normocephalic.  Right Ear: External ear  normal.  Left Ear: External ear normal.       Low hanging soft palate with pharyngeal crowding  Eyes: Conjunctivae and EOM are normal.  Neck: Normal range of motion.  Cardiovascular: Normal rate and normal heart sounds.   Pulmonary/Chest: Breath sounds normal.  Abdominal: Bowel sounds are normal.       Nicely healing laparoscopic scars  Musculoskeletal: Normal range of motion. He exhibits no edema and no tenderness.  Neurological: He is alert and oriented to person, place, and time.  Psychiatric: He has a normal mood and affect. His behavior is normal.          Assessment & Plan:   Status post Nissen fundoplication Moderate OSA. We'll give a trial of CPAP. Sleep study report reviewed Hypothyroidism. We'll check a TSH History of dyslipidemia. We'll check a lipid profile

## 2012-01-05 ENCOUNTER — Ambulatory Visit: Payer: Medicare Other | Admitting: Internal Medicine

## 2012-03-05 ENCOUNTER — Ambulatory Visit (INDEPENDENT_AMBULATORY_CARE_PROVIDER_SITE_OTHER): Payer: Medicare Other | Admitting: Internal Medicine

## 2012-03-05 ENCOUNTER — Encounter: Payer: Self-pay | Admitting: Internal Medicine

## 2012-03-05 VITALS — BP 100/62 | Temp 97.8°F | Wt 166.0 lb

## 2012-03-05 DIAGNOSIS — G4733 Obstructive sleep apnea (adult) (pediatric): Secondary | ICD-10-CM | POA: Diagnosis not present

## 2012-03-05 DIAGNOSIS — K219 Gastro-esophageal reflux disease without esophagitis: Secondary | ICD-10-CM | POA: Diagnosis not present

## 2012-03-05 NOTE — Patient Instructions (Signed)
Limit your sodium (Salt) intake    It is important that you exercise regularly, at least 20 minutes 3 to 4 times per week.  If you develop chest pain or shortness of breath seek  medical attention.  Return in 6 months for follow-up  

## 2012-03-05 NOTE — Progress Notes (Signed)
Subjective:    Patient ID: [name], male    DOB: 12-13-1934, 77 y.o.   MRN: [id number]  HPI  31 -year-old patient who is seen today for followup. He has a history of moderate OSA and advanced home care has been working with the patient as far as adjusting CPAP machine. He has also been to see his dentist for a new oral appliance there has been some issues with tolerating the  many different facemasks. Her this year he underwent surgery to repair a paraesophageal hernia and has done quite well. He has mild dyslipidemia  Past Medical History  Diagnosis Date  . Headache   . GERD (gastroesophageal reflux disease)   . Hx of colonic polyps     Dr. Kinnie Scales removed 2 in 2009  . History of cystoscopy 1965  . History of hernia repair     left side:1991and 1978 right side: 2009  . Thyroid disease     hypothyroidism  . Hyperlipidemia   . Anemia   . Hx of benign prostatic hypertrophy   . Shortness of breath     with exertion   . Sleep apnea     2008 no CPAP machine   . Hypothyroidism   . H/O hiatal hernia   . SVT (supraventricular tachycardia)     2002     History   Social History  . Marital Status: Married    Spouse Name: N/A    Number of Children: N/A  . Years of Education: N/A   Occupational History  . Not on file.   Social History Main Topics  . Smoking status: Former Games developer  . Smokeless tobacco: Never Used  . Alcohol Use: 0.6 oz/week    1 Glasses of wine per week     2 nites per week   . Drug Use: No  . Sexually Active: No   Other Topics Concern  . Not on file   Social History Narrative  . No narrative on file    Past Surgical History  Procedure Date  . Thyroidectomy 1967  . Bilateral hernia repair [id number]  . Cystocopy 1965  . Pneumonia 77 years old  . Borkne bones foot 77 yrs old  . Broken finger 77 yrs old  . Skin cancer face last ck on 08-22-07   . Hernia repair 1991, 1998, 2009  . Laparoscopic nissen fundoplication 11/01/2011    Procedure:  LAPAROSCOPIC NISSEN FUNDOPLICATION;  Surgeon: Mariella Saa, MD;  Location: [facility];  Service: General;  Laterality: N/A;  Laparoscopic Repair of Large Hiatal Hernia with Nissen Fundoplication with mesh     Family History  Problem Relation Age of Onset  . Cancer Mother 61    breast  . Heart disease Mother   . Dementia Mother 98  . Thyroid disease Mother   . Macular degeneration Mother   . Cancer Maternal Grandmother   . Cancer Paternal Grandmother   . Heart disease Father   . Hypertension Father   . Heart failure Father   . Dementia Father     No Known Allergies  Current Outpatient Prescriptions on File Prior to Visit  Medication Sig Dispense Refill  . acetaminophen (TYLENOL) 500 MG tablet Take 1,000 mg by mouth every 4 (four) hours as needed. For pain      . aspirin 81 MG tablet Take 81 mg by mouth every morning.       . cetirizine (ZYRTEC) 10 MG tablet Take 10 mg by mouth every evening.       Marland Kitchen  levothyroxine (SYNTHROID, LEVOTHROID) 125 MCG tablet Take 1 tablet (125 mcg total) by mouth every morning.  90 tablet  6  . metoprolol succinate (TOPROL-XL) 25 MG 24 hr tablet Take 1 tablet (25 mg total) by mouth daily.  90 tablet  3  . Multiple Vitamins-Minerals (ICAPS AREDS FORMULA PO) Take 1 tablet by mouth 2 (two) times daily.      . multivitamin (THERAGRAN) per tablet Take 1 tablet by mouth daily.       . pseudoephedrine (SUDAFED) 30 MG tablet Take 60 mg by mouth every 4 (four) hours as needed. For sinus congestion      . vitamin B-12 (CYANOCOBALAMIN) 1000 MCG tablet Take 500 mcg by mouth daily. Patient states that he takes half tablet once a day      . DISCONTD: diphenhydrAMINE (BENADRYL) 25 mg capsule Take by mouth. Take 1 or 2 tablets as needed        BP 100/62  Temp 97.8 F (36.6 C) (Oral)  Wt 166 lb (75.297 kg)     Review of Systems  Constitutional: Negative for fever, chills, appetite change and fatigue.  HENT: Negative for hearing loss, ear pain, congestion, sore  throat, trouble swallowing, neck stiffness, dental problem, voice change and tinnitus.   Eyes: Negative for pain, discharge and visual disturbance.  Respiratory: Negative for cough, chest tightness, wheezing and stridor.   Cardiovascular: Negative for chest pain, palpitations and leg swelling.  Gastrointestinal: Negative for nausea, vomiting, abdominal pain, diarrhea, constipation, blood in stool and abdominal distention.  Genitourinary: Negative for urgency, hematuria, flank pain, discharge, difficulty urinating and genital sores.  Musculoskeletal: Negative for myalgias, back pain, joint swelling, arthralgias and gait problem.  Skin: Negative for rash.  Neurological: Negative for dizziness, syncope, speech difficulty, weakness, numbness and headaches.  Hematological: Negative for adenopathy. Does not bruise/bleed easily.  Psychiatric/Behavioral: Negative for behavioral problems and dysphoric mood. The patient is not nervous/anxious.        Objective:   Physical Exam  Constitutional: He is oriented to person, place, and time. He appears well-developed.  HENT:  Head: Normocephalic.  Right Ear: External ear normal.  Left Ear: External ear normal.  Eyes: Conjunctivae and EOM are normal.  Neck: Normal range of motion.  Cardiovascular: Normal rate and normal heart sounds.   Pulmonary/Chest: Breath sounds normal.  Abdominal: Bowel sounds are normal.  Musculoskeletal: Normal range of motion. He exhibits no edema and no tenderness.  Neurological: He is alert and oriented to person, place, and time.  Psychiatric: He has a normal mood and affect. His behavior is normal.          Assessment & Plan:   OSA. The patient will continue some pollen air approaches to his face mask and CPAP. Will also be fitted by his dentist for a new oral appliance. If he is not pleased with adjustments will refer to Dr. Shelle Iron.  We'll see in 6 months for his annual exam

## 2012-03-12 ENCOUNTER — Ambulatory Visit: Payer: Medicare Other | Admitting: Internal Medicine

## 2012-03-22 ENCOUNTER — Encounter (INDEPENDENT_AMBULATORY_CARE_PROVIDER_SITE_OTHER): Payer: Self-pay | Admitting: General Surgery

## 2012-03-22 ENCOUNTER — Ambulatory Visit (INDEPENDENT_AMBULATORY_CARE_PROVIDER_SITE_OTHER): Payer: Medicare Other | Admitting: General Surgery

## 2012-03-22 VITALS — BP 114/72 | HR 71 | Temp 97.4°F | Resp 16 | Ht 70.0 in | Wt 168.8 lb

## 2012-03-22 DIAGNOSIS — Z09 Encounter for follow-up examination after completed treatment for conditions other than malignant neoplasm: Secondary | ICD-10-CM

## 2012-03-22 NOTE — Progress Notes (Signed)
History: Patient returns for more long-term followup status post laparoscopic repair of his very large hiatal hernia. At this point he reports he is doing extremely well. He has stopped his aspirin reduction therapy and has no symptoms of reflux. He occasionally gets a little bit of belching but is not a problem for him. He is swallowing without any dysphagia or pain. He feels that his breathing is somewhat better than before surgery.  Exam: BP 114/72  Pulse 71  Temp 97.4 F (36.3 C) (Temporal)  Resp 16  Ht 5\' 10"  (1.778 m)  Wt 168 lb 12.8 oz (76.567 kg)  BMI 24.22 kg/m2 General: Appears well Abdomen: Soft and nontender. Wounds are well healed.  Assessment and plan: Doing well following repair of his large hiatal hernia with no apparent complications in very good relief of his symptoms. I asked him to call me as needed for any questions or concerns.

## 2012-04-19 ENCOUNTER — Ambulatory Visit (INDEPENDENT_AMBULATORY_CARE_PROVIDER_SITE_OTHER): Payer: Medicare Other

## 2012-04-19 DIAGNOSIS — Z23 Encounter for immunization: Secondary | ICD-10-CM | POA: Diagnosis not present

## 2012-06-20 DIAGNOSIS — H25019 Cortical age-related cataract, unspecified eye: Secondary | ICD-10-CM | POA: Diagnosis not present

## 2012-06-20 DIAGNOSIS — H2510 Age-related nuclear cataract, unspecified eye: Secondary | ICD-10-CM | POA: Diagnosis not present

## 2012-06-20 DIAGNOSIS — H3530 Unspecified macular degeneration: Secondary | ICD-10-CM | POA: Diagnosis not present

## 2012-07-05 ENCOUNTER — Encounter: Payer: Self-pay | Admitting: Internal Medicine

## 2012-07-05 ENCOUNTER — Ambulatory Visit (INDEPENDENT_AMBULATORY_CARE_PROVIDER_SITE_OTHER): Payer: Medicare Other | Admitting: Internal Medicine

## 2012-07-05 VITALS — BP 110/70 | HR 64 | Temp 98.1°F | Resp 16 | Wt 175.0 lb

## 2012-07-05 DIAGNOSIS — E039 Hypothyroidism, unspecified: Secondary | ICD-10-CM | POA: Diagnosis not present

## 2012-07-05 NOTE — Progress Notes (Signed)
Subjective:    Patient ID: [name], male    DOB: 11-21-1934, 77 y.o.   MRN: [id number]  HPI 77 year old patient who is seen today for followup. Remains quite stable. He has treated hypothyroidism. He is on low dose metoprolol apparently more for arrhythmia then hypertensive effects. Asymptomatic without any cardiopulmonary complaints. He's had general surgical followup following surgery for a paraesophageal hernia this past spring. He has been quite pleased with his progress and now has no GERD symptoms off medication  Past Medical History  Diagnosis Date  . Headache   . GERD (gastroesophageal reflux disease)   . Hx of colonic polyps     Dr. Kinnie Scales removed 2 in 2009  . History of cystoscopy 1965  . History of hernia repair     left side:1991and 1978 right side: 2009  . Thyroid disease     hypothyroidism  . Hyperlipidemia   . Anemia   . Hx of benign prostatic hypertrophy   . Shortness of breath     with exertion   . Sleep apnea     2008 no CPAP machine   . Hypothyroidism   . H/O hiatal hernia   . SVT (supraventricular tachycardia)     2002     History   Social History  . Marital Status: Married    Spouse Name: N/A    Number of Children: N/A  . Years of Education: N/A   Occupational History  . Not on file.   Social History Main Topics  . Smoking status: Former Games developer  . Smokeless tobacco: Never Used  . Alcohol Use: 0.6 oz/week    1 Glasses of wine per week     Comment: 2 nites per week   . Drug Use: No  . Sexually Active: No   Other Topics Concern  . Not on file   Social History Narrative  . No narrative on file    Past Surgical History  Procedure Date  . Thyroidectomy 1967  . Bilateral hernia repair [id number]  . Cystocopy 1965  . Pneumonia 77 years old  . Borkne bones foot 77 yrs old  . Broken finger 77 yrs old  . Skin cancer face last ck on 08-22-07   . Hernia repair 1991, 1998, 2009  . Laparoscopic nissen fundoplication 11/01/2011    Procedure:  LAPAROSCOPIC NISSEN FUNDOPLICATION;  Surgeon: Mariella Saa, MD;  Location: [facility];  Service: General;  Laterality: N/A;  Laparoscopic Repair of Large Hiatal Hernia with Nissen Fundoplication with mesh     Family History  Problem Relation Age of Onset  . Cancer Mother 72    breast  . Heart disease Mother   . Dementia Mother 20  . Thyroid disease Mother   . Macular degeneration Mother   . Cancer Maternal Grandmother   . Cancer Paternal Grandmother   . Heart disease Father   . Hypertension Father   . Heart failure Father   . Dementia Father     No Known Allergies  Current Outpatient Prescriptions on File Prior to Visit  Medication Sig Dispense Refill  . acetaminophen (TYLENOL) 500 MG tablet Take 1,000 mg by mouth every 4 (four) hours as needed. For pain      . aspirin 81 MG tablet Take 81 mg by mouth every morning.       . cetirizine (ZYRTEC) 10 MG tablet Take 10 mg by mouth every evening.       Marland Kitchen levothyroxine (SYNTHROID, LEVOTHROID) 125 MCG tablet Take  1 tablet (125 mcg total) by mouth every morning.  90 tablet  6  . metoprolol succinate (TOPROL-XL) 25 MG 24 hr tablet Take 1 tablet (25 mg total) by mouth daily.  90 tablet  3  . Multiple Vitamins-Minerals (ICAPS AREDS FORMULA PO) Take 1 tablet by mouth 2 (two) times daily.      . multivitamin (THERAGRAN) per tablet Take 1 tablet by mouth daily.       . pseudoephedrine (SUDAFED) 30 MG tablet Take 60 mg by mouth every 4 (four) hours as needed. For sinus congestion      . vitamin B-12 (CYANOCOBALAMIN) 1000 MCG tablet Take 500 mcg by mouth daily. Patient states that he takes half tablet once a day      . [DISCONTINUED] diphenhydrAMINE (BENADRYL) 25 mg capsule Take by mouth. Take 1 or 2 tablets as needed        BP 110/70  Pulse 64  Temp 98.1 F (36.7 C) (Oral)  Resp 16  Wt 175 lb (79.379 kg)  SpO2 96%       Review of Systems  Constitutional: Negative for fever, chills, appetite change and fatigue.  HENT: Negative for  hearing loss, ear pain, congestion, sore throat, trouble swallowing, neck stiffness, dental problem, voice change and tinnitus.   Eyes: Negative for pain, discharge and visual disturbance.  Respiratory: Negative for cough, chest tightness, wheezing and stridor.   Cardiovascular: Negative for chest pain, palpitations and leg swelling.  Gastrointestinal: Negative for nausea, vomiting, abdominal pain, diarrhea, constipation, blood in stool and abdominal distention.  Genitourinary: Negative for urgency, hematuria, flank pain, discharge, difficulty urinating and genital sores.  Musculoskeletal: Negative for myalgias, back pain, joint swelling, arthralgias and gait problem.  Skin: Negative for rash.  Neurological: Negative for dizziness, syncope, speech difficulty, weakness, numbness and headaches.  Hematological: Negative for adenopathy. Does not bruise/bleed easily.  Psychiatric/Behavioral: Negative for behavioral problems and dysphoric mood. The patient is not nervous/anxious.        Objective:   Physical Exam  Constitutional: He is oriented to person, place, and time. He appears well-developed.  HENT:  Head: Normocephalic.  Right Ear: External ear normal.  Left Ear: External ear normal.  Eyes: Conjunctivae normal and EOM are normal.  Neck: Normal range of motion.  Cardiovascular: Normal rate and normal heart sounds.   Pulmonary/Chest: Breath sounds normal.  Abdominal: Bowel sounds are normal.  Musculoskeletal: Normal range of motion. He exhibits no edema and no tenderness.  Neurological: He is alert and oriented to person, place, and time.  Psychiatric: He has a normal mood and affect. His behavior is normal.          Assessment & Plan:    Hypothyroidism History cardiac dysrhythmia Dyslipidemia Status post paraesophageal hernia repair OSA stable   CPX 6 months

## 2012-07-05 NOTE — Patient Instructions (Signed)
Limit your sodium (Salt) intake    It is important that you exercise regularly, at least 20 minutes 3 to 4 times per week.  If you develop chest pain or shortness of breath seek  medical attention.  Please check your blood pressure on a regular basis.  If it is consistently greater than 150/90, please make an office appointment.  Return in 6 months for follow-up   

## 2012-09-05 ENCOUNTER — Ambulatory Visit: Payer: Medicare Other | Admitting: Internal Medicine

## 2012-11-08 DIAGNOSIS — R141 Gas pain: Secondary | ICD-10-CM | POA: Diagnosis not present

## 2012-11-08 DIAGNOSIS — K219 Gastro-esophageal reflux disease without esophagitis: Secondary | ICD-10-CM | POA: Diagnosis not present

## 2012-11-25 DIAGNOSIS — R142 Eructation: Secondary | ICD-10-CM | POA: Diagnosis not present

## 2012-11-25 DIAGNOSIS — K219 Gastro-esophageal reflux disease without esophagitis: Secondary | ICD-10-CM | POA: Diagnosis not present

## 2012-11-25 DIAGNOSIS — K929 Disease of digestive system, unspecified: Secondary | ICD-10-CM | POA: Diagnosis not present

## 2012-11-25 DIAGNOSIS — R141 Gas pain: Secondary | ICD-10-CM | POA: Diagnosis not present

## 2012-11-25 DIAGNOSIS — R1110 Vomiting, unspecified: Secondary | ICD-10-CM | POA: Diagnosis not present

## 2012-11-25 DIAGNOSIS — K449 Diaphragmatic hernia without obstruction or gangrene: Secondary | ICD-10-CM | POA: Diagnosis not present

## 2013-01-06 ENCOUNTER — Ambulatory Visit (INDEPENDENT_AMBULATORY_CARE_PROVIDER_SITE_OTHER): Payer: Medicare Other | Admitting: Internal Medicine

## 2013-01-06 ENCOUNTER — Encounter: Payer: Self-pay | Admitting: Internal Medicine

## 2013-01-06 VITALS — BP 106/66 | HR 58 | Temp 98.0°F | Resp 18 | Ht 69.25 in | Wt 177.0 lb

## 2013-01-06 DIAGNOSIS — Z0000 Encounter for general adult medical examination without abnormal findings: Secondary | ICD-10-CM

## 2013-01-06 DIAGNOSIS — E785 Hyperlipidemia, unspecified: Secondary | ICD-10-CM

## 2013-01-06 DIAGNOSIS — Z86010 Personal history of colonic polyps: Secondary | ICD-10-CM | POA: Diagnosis not present

## 2013-01-06 DIAGNOSIS — D649 Anemia, unspecified: Secondary | ICD-10-CM

## 2013-01-06 DIAGNOSIS — E039 Hypothyroidism, unspecified: Secondary | ICD-10-CM | POA: Diagnosis not present

## 2013-01-06 DIAGNOSIS — G4733 Obstructive sleep apnea (adult) (pediatric): Secondary | ICD-10-CM

## 2013-01-06 DIAGNOSIS — K219 Gastro-esophageal reflux disease without esophagitis: Secondary | ICD-10-CM

## 2013-01-06 LAB — COMPREHENSIVE METABOLIC PANEL
ALT: 21 U/L (ref 0–53)
AST: 20 U/L (ref 0–37)
Albumin: 3.8 g/dL (ref 3.5–5.2)
Alkaline Phosphatase: 74 U/L (ref 39–117)
BUN: 14 mg/dL (ref 6–23)
CO2: 24 mEq/L (ref 19–32)
Calcium: 9.3 mg/dL (ref 8.4–10.5)
Chloride: 104 mEq/L (ref 96–112)
Creatinine, Ser: 1 mg/dL (ref 0.4–1.5)
GFR: 79.04 mL/min (ref >60.00)
Glucose, Bld: 95 mg/dL (ref 70–99)
Potassium: 4.4 mEq/L (ref 3.5–5.1)
Sodium: 138 mEq/L (ref 135–145)
Total Bilirubin: 0.7 mg/dL (ref 0.3–1.2)
Total Protein: 7.2 g/dL (ref 6.0–8.3)

## 2013-01-06 LAB — CBC WITH DIFFERENTIAL/PLATELET
Basophils Absolute: 0.1 10*3/uL (ref 0.0–0.1)
Basophils Relative: 1.4 % (ref 0.0–3.0)
Eosinophils Absolute: 0.2 10*3/uL (ref 0.0–0.7)
Eosinophils Relative: 4.5 % (ref 0.0–5.0)
HCT: 44.9 % (ref 39.0–52.0)
Hemoglobin: 14.8 g/dL (ref 13.0–17.0)
Lymphocytes Relative: 31.4 % (ref 12.0–46.0)
Lymphs Abs: 1.7 10*3/uL (ref 0.7–4.0)
MCHC: 33 g/dL (ref 30.0–36.0)
MCV: 88 fl (ref 78.0–100.0)
Monocytes Absolute: 0.6 10*3/uL (ref 0.1–1.0)
Monocytes Relative: 11 % (ref 3.0–12.0)
Neutro Abs: 2.7 10*3/uL (ref 1.4–7.7)
Neutrophils Relative %: 51.7 % (ref 43.0–77.0)
Platelets: 218 10*3/uL (ref 150.0–400.0)
RBC: 5.11 Mil/uL (ref 4.22–5.81)
RDW: 14.2 % (ref 11.5–14.6)
WBC: 5.3 10*3/uL (ref 4.5–10.5)

## 2013-01-06 LAB — LIPID PANEL
Cholesterol: 178 mg/dL (ref 0–200)
HDL: 41.1 mg/dL (ref >39.00)
LDL Cholesterol: 120 mg/dL — ABNORMAL HIGH (ref 0–99)
Total CHOL/HDL Ratio: 4
Triglycerides: 87 mg/dL (ref 0.0–149.0)
VLDL: 17.4 mg/dL (ref 0.0–40.0)

## 2013-01-06 LAB — TSH: TSH: 1.09 u[IU]/mL (ref 0.35–5.50)

## 2013-01-06 MED ORDER — LEVOTHYROXINE SODIUM 125 MCG PO TABS: 125.0000 ug | ORAL_TABLET | ORAL | Status: DC

## 2013-01-06 NOTE — Progress Notes (Signed)
Patient ID: [name], male   DOB: 11/28/1934, 78 y.o.   MRN: [id number]  Subjective:    Patient ID: [name], male    DOB: 12/28/1934, 78 y.o.   MRN: [id number]  HPI  78 year old patient who is seen today in followup. He is approximately 14  months status post laparoscopic Nissan fundoplication for a large hiatal  hernia with refractory reflux symptoms. He has done well postoperatively.  He has a history of OSA and underwent a split night sleep study 6 years ago that revealed moderate OSA. He has a history of hypothyroidism and no recent TSH.  Past Medical History  Diagnosis Date  . Headache(784.0)   . GERD (gastroesophageal reflux disease)   . Hx of colonic polyps     Dr. Kinnie Scales removed 2 in 2009  . History of cystoscopy 1965  . History of hernia repair     left side:1991and 1978 right side: 2009  . Thyroid disease     hypothyroidism  . Hyperlipidemia   . Anemia   . Hx of benign prostatic hypertrophy   . Shortness of breath     with exertion   . Sleep apnea     2008 no CPAP machine   . Hypothyroidism   . H/O hiatal hernia   . SVT (supraventricular tachycardia)     2002     History   Social History  . Marital Status: Married    Spouse Name: N/A    Number of Children: N/A  . Years of Education: N/A   Occupational History  . Not on file.   Social History Main Topics  . Smoking status: Former Games developer  . Smokeless tobacco: Never Used  . Alcohol Use: 0.6 oz/week    1 Glasses of wine per week     Comment: 2 nites per week   . Drug Use: No  . Sexually Active: No   Other Topics Concern  . Not on file   Social History Narrative  . No narrative on file    Past Surgical History  Procedure Laterality Date  . Thyroidectomy  1967  . Bilateral hernia repair  [id number]  . Cystocopy  1965  . Pneumonia  78 years old  . Borkne bones foot  78 yrs old  . Broken finger  78 yrs old  . Skin cancer face last ck on 08-22-07    . Hernia repair  1991, 1998, 2009  .  Laparoscopic nissen fundoplication  11/01/2011    Procedure: LAPAROSCOPIC NISSEN FUNDOPLICATION;  Surgeon: Mariella Saa, MD;  Location: [facility];  Service: General;  Laterality: N/A;  Laparoscopic Repair of Large Hiatal Hernia with Nissen Fundoplication with mesh     Family History  Problem Relation Age of Onset  . Cancer Mother 84    breast  . Heart disease Mother   . Dementia Mother 41  . Thyroid disease Mother   . Macular degeneration Mother   . Cancer Maternal Grandmother   . Cancer Paternal Grandmother   . Heart disease Father   . Hypertension Father   . Heart failure Father   . Dementia Father     No Known Allergies  Current Outpatient Prescriptions on File Prior to Visit  Medication Sig Dispense Refill  . acetaminophen (TYLENOL) 500 MG tablet Take 1,000 mg by mouth every 4 (four) hours as needed. For pain      . aspirin 81 MG tablet Take 81 mg by mouth every morning.       Marland Kitchen  cetirizine (ZYRTEC) 10 MG tablet Take 10 mg by mouth every evening.       Marland Kitchen levothyroxine (SYNTHROID, LEVOTHROID) 125 MCG tablet Take 1 tablet (125 mcg total) by mouth every morning.  90 tablet  6  . Multiple Vitamins-Minerals (ICAPS AREDS FORMULA PO) Take 1 tablet by mouth 2 (two) times daily.      . multivitamin (THERAGRAN) per tablet Take 1 tablet by mouth daily.       . pseudoephedrine (SUDAFED) 30 MG tablet Take 60 mg by mouth every 4 (four) hours as needed. For sinus congestion      . vitamin B-12 (CYANOCOBALAMIN) 1000 MCG tablet Take 500 mcg by mouth daily. Patient states that he takes half tablet once a day      . [DISCONTINUED] diphenhydrAMINE (BENADRYL) 25 mg capsule Take by mouth. Take 1 or 2 tablets as needed       No current facility-administered medications on file prior to visit.    BP 106/66  Pulse 58  Temp(Src) 98 F (36.7 C) (Oral)  Resp 18  Ht 5' 9.25" (1.759 m)  Wt 177 lb (80.287 kg)  BMI 25.95 kg/m2  SpO2 98%   1. Risk factors, based on past  M,S,F history-  cardiovascular risk factors unremarkable except for age and male sex  2.  Physical activities: Remains active physically no exercise intolerance  3.  Depression/mood: No history depression or mood disorder  4.  Hearing: No deficits  5.  ADL's: Independent in all aspects of daily living 6.  Fall risk:  7.  Home safety: Low  8.  Height weight, and visual acuity; height and weight stable no change in visual acuity does have a history of vitamin D deficiency in the past  9.  Counseling: Active lifestyle more regular exercise all encouraged  10. Lab orders based on risk factors: Laboratory profile including TSH and lipid panel will be reviewed   11. Referral : Not appropriate at this time. Has had recent GI followup  12. Care plan: Continue a regular exercise program and heart healthy diet  13. Cognitive assessment: Alert and with normal affect. No cognitive dysfunction       Review of Systems  Constitutional: Negative for fever, chills, appetite change and fatigue.  HENT: Negative for hearing loss, ear pain, congestion, sore throat, trouble swallowing, neck stiffness, dental problem, voice change and tinnitus.   Eyes: Negative for pain, discharge and visual disturbance.  Respiratory: Negative for cough, chest tightness, wheezing and stridor.   Cardiovascular: Negative for chest pain, palpitations and leg swelling.  Gastrointestinal: Negative for nausea, vomiting, abdominal pain, diarrhea, constipation, blood in stool and abdominal distention.  Genitourinary: Negative for urgency, hematuria, flank pain, discharge, difficulty urinating and genital sores.  Musculoskeletal: Negative for myalgias, back pain, joint swelling, arthralgias and gait problem.  Skin: Negative for rash.  Neurological: Negative for dizziness, syncope, speech difficulty, weakness, numbness and headaches.  Hematological: Negative for adenopathy. Does not bruise/bleed easily.  Psychiatric/Behavioral: Negative  for behavioral problems and dysphoric mood. The patient is not nervous/anxious.        Objective:   Physical Exam  Constitutional: He is oriented to person, place, and time. He appears well-developed.  HENT:  Head: Normocephalic.  Right Ear: External ear normal.  Left Ear: External ear normal.       Low hanging soft palate with pharyngeal crowding  Eyes: Conjunctivae and EOM are normal.  Neck: Normal range of motion.  Cardiovascular: Normal rate and normal heart sounds.  Pulmonary/Chest: Breath sounds normal.  Abdominal: Bowel sounds are normal.       Nicely healing laparoscopic scars  Musculoskeletal: Normal range of motion. He exhibits no edema and no tenderness.  Neurological: He is alert and oriented to person, place, and time.  Psychiatric: He has a normal mood and affect. His behavior is normal.  Genitourinary. Prostate +2 enlarged         Assessment & Plan:  Preventive health examination Status post Nissen fundoplication Moderate OSA.  History of dyslipidemia. We'll check a lipid profile Hypothyroidism. We'll check a TSH  All medications refilled

## 2013-01-06 NOTE — Patient Instructions (Signed)
Limit your sodium (Salt) intake     It is important that you exercise regularly, at least 20 minutes 3 to 4 times per week.  If you develop chest pain or shortness of breath seek  medical attention.  Avoids foods high in acid such as tomatoes citrus juices, and spicy foods.  Avoid eating within two hours of lying down or before exercising.  Do not overheat.  Try smaller more frequent meals.  If symptoms persist, elevate the head of her bed 12 inches while sleeping.  

## 2013-01-10 ENCOUNTER — Other Ambulatory Visit: Payer: Self-pay | Admitting: *Deleted

## 2013-01-10 MED ORDER — METOPROLOL SUCCINATE ER 25 MG PO TB24: 25.0000 mg | ORAL_TABLET | Freq: Every day | ORAL | Status: DC

## 2013-01-29 DIAGNOSIS — L819 Disorder of pigmentation, unspecified: Secondary | ICD-10-CM | POA: Diagnosis not present

## 2013-01-29 DIAGNOSIS — L723 Sebaceous cyst: Secondary | ICD-10-CM | POA: Diagnosis not present

## 2013-05-02 ENCOUNTER — Ambulatory Visit (INDEPENDENT_AMBULATORY_CARE_PROVIDER_SITE_OTHER): Payer: Medicare Other

## 2013-05-02 DIAGNOSIS — Z23 Encounter for immunization: Secondary | ICD-10-CM

## 2013-06-23 DIAGNOSIS — H3530 Unspecified macular degeneration: Secondary | ICD-10-CM | POA: Diagnosis not present

## 2013-06-23 DIAGNOSIS — H25019 Cortical age-related cataract, unspecified eye: Secondary | ICD-10-CM | POA: Diagnosis not present

## 2013-08-11 ENCOUNTER — Telehealth: Payer: Self-pay | Admitting: Internal Medicine

## 2013-08-11 MED ORDER — METOPROLOL SUCCINATE ER 25 MG PO TB24: 25.0000 mg | ORAL_TABLET | Freq: Every day | ORAL | Status: DC

## 2013-08-11 NOTE — Telephone Encounter (Signed)
Pt needs refill on metoprol xl 25 mg #90 w/refills call to [facility] [phone]

## 2013-08-11 NOTE — Telephone Encounter (Signed)
Rx sent to [facility].   

## 2013-11-19 ENCOUNTER — Telehealth: Payer: Self-pay | Admitting: Internal Medicine

## 2013-11-19 MED ORDER — METOPROLOL SUCCINATE ER 25 MG PO TB24: 25.0000 mg | ORAL_TABLET | Freq: Every day | ORAL | Status: DC

## 2013-11-19 NOTE — Telephone Encounter (Signed)
Pt notified Rx was sent to [facility] but needs physical in June.

## 2013-11-19 NOTE — Telephone Encounter (Signed)
[facility] ([facility] - [place], [place] is requesting re-fill on metoprolol succinate (TOPROL-XL) 25 MG 24 hr tablet

## 2014-01-07 ENCOUNTER — Encounter: Payer: Self-pay | Admitting: Internal Medicine

## 2014-01-07 ENCOUNTER — Ambulatory Visit (INDEPENDENT_AMBULATORY_CARE_PROVIDER_SITE_OTHER): Payer: Medicare Other | Admitting: Internal Medicine

## 2014-01-07 VITALS — BP 120/70 | HR 54 | Temp 98.2°F | Resp 20 | Ht 69.0 in | Wt 177.0 lb

## 2014-01-07 DIAGNOSIS — Z0000 Encounter for general adult medical examination without abnormal findings: Secondary | ICD-10-CM

## 2014-01-07 DIAGNOSIS — Z87898 Personal history of other specified conditions: Secondary | ICD-10-CM | POA: Diagnosis not present

## 2014-01-07 DIAGNOSIS — Z23 Encounter for immunization: Secondary | ICD-10-CM | POA: Diagnosis not present

## 2014-01-07 DIAGNOSIS — E039 Hypothyroidism, unspecified: Secondary | ICD-10-CM

## 2014-01-07 DIAGNOSIS — E785 Hyperlipidemia, unspecified: Secondary | ICD-10-CM

## 2014-01-07 DIAGNOSIS — D649 Anemia, unspecified: Secondary | ICD-10-CM

## 2014-01-07 DIAGNOSIS — Z860100 Personal history of colon polyps, unspecified: Secondary | ICD-10-CM

## 2014-01-07 DIAGNOSIS — G4733 Obstructive sleep apnea (adult) (pediatric): Secondary | ICD-10-CM

## 2014-01-07 LAB — CBC WITH DIFFERENTIAL/PLATELET
Basophils Absolute: 0.1 10*3/uL (ref 0.0–0.1)
Basophils Relative: 1.1 % (ref 0.0–3.0)
Eosinophils Absolute: 0.1 10*3/uL (ref 0.0–0.7)
Eosinophils Relative: 2.2 % (ref 0.0–5.0)
HCT: 44.5 % (ref 39.0–52.0)
Hemoglobin: 14.7 g/dL (ref 13.0–17.0)
Lymphocytes Relative: 32.1 % (ref 12.0–46.0)
Lymphs Abs: 1.6 10*3/uL (ref 0.7–4.0)
MCHC: 33 g/dL (ref 30.0–36.0)
MCV: 87 fl (ref 78.0–100.0)
Monocytes Absolute: 0.6 10*3/uL (ref 0.1–1.0)
Monocytes Relative: 12.2 % — ABNORMAL HIGH (ref 3.0–12.0)
Neutro Abs: 2.6 10*3/uL (ref 1.4–7.7)
Neutrophils Relative %: 52.4 % (ref 43.0–77.0)
Platelets: 236 10*3/uL (ref 150.0–400.0)
RBC: 5.12 Mil/uL (ref 4.22–5.81)
RDW: 14.5 % (ref 11.5–15.5)
WBC: 5 10*3/uL (ref 4.0–10.5)

## 2014-01-07 LAB — COMPREHENSIVE METABOLIC PANEL
ALT: 21 U/L (ref 0–53)
AST: 24 U/L (ref 0–37)
Albumin: 3.9 g/dL (ref 3.5–5.2)
Alkaline Phosphatase: 84 U/L (ref 39–117)
BUN: 12 mg/dL (ref 6–23)
CO2: 28 mEq/L (ref 19–32)
Calcium: 9.6 mg/dL (ref 8.4–10.5)
Chloride: 102 mEq/L (ref 96–112)
Creatinine, Ser: 1 mg/dL (ref 0.4–1.5)
GFR: 76.13 mL/min (ref >60.00)
Glucose, Bld: 102 mg/dL — ABNORMAL HIGH (ref 70–99)
Potassium: 5.2 mEq/L — ABNORMAL HIGH (ref 3.5–5.1)
Sodium: 136 mEq/L (ref 135–145)
Total Bilirubin: 0.5 mg/dL (ref 0.2–1.2)
Total Protein: 7.1 g/dL (ref 6.0–8.3)

## 2014-01-07 LAB — LIPID PANEL
Cholesterol: 176 mg/dL (ref 0–200)
HDL: 40.7 mg/dL (ref >39.00)
LDL Cholesterol: 111 mg/dL — ABNORMAL HIGH (ref 0–99)
NonHDL: 135.3
Total CHOL/HDL Ratio: 4
Triglycerides: 120 mg/dL (ref 0.0–149.0)
VLDL: 24 mg/dL (ref 0.0–40.0)

## 2014-01-07 LAB — TSH: TSH: 1.17 u[IU]/mL (ref 0.35–4.50)

## 2014-01-07 MED ORDER — METOPROLOL SUCCINATE ER 25 MG PO TB24: 25.0000 mg | ORAL_TABLET | Freq: Every day | ORAL | Status: DC

## 2014-01-07 MED ORDER — LEVOTHYROXINE SODIUM 125 MCG PO TABS: 125.0000 ug | ORAL_TABLET | ORAL | Status: DC

## 2014-01-07 NOTE — Patient Instructions (Signed)
Limit your sodium (Salt) intake  Please check your blood pressure on a regular basis.  If it is consistently greater than 150/90, please make an office appointment.    It is important that you exercise regularly, at least 20 minutes 3 to 4 times per week.  If you develop chest pain or shortness of breath seek  medical attention.  Return in one year for follow-up  Avoids foods high in acid such as tomatoes citrus juices, and spicy foods.  Avoid eating within two hours of lying down or before exercising.  Do not overheat.  Try smaller more frequent meals.  If symptoms persist, elevate the head of her bed 12 inches while sleeping.

## 2014-01-07 NOTE — Progress Notes (Signed)
Pre-visit discussion using our clinic review tool. No additional management support is needed unless otherwise documented below in the visit note.  

## 2014-01-07 NOTE — Progress Notes (Signed)
Subjective:    Patient ID: [name], male    DOB: 06-27-1935, 79 y.o.   MRN: [id number]  HPI  Patient ID: [name], male   DOB: 05/19/1935, 79 y.o.   MRN: [id number]  Subjective:    Patient ID: [name], male    DOB: [birth year], 79 y.o.   MRN: [id number]  HPI  79 year-old patient who is seen today for a preventive health examination.  He is approximately 2 years status post laparoscopic Nissan fundoplication for a large hiatal  hernia with refractory reflux symptoms. He has done well postoperatively.  He has a history of OSA and underwent a split night sleep study 7 years ago that revealed moderate OSA. He has a history of hypothyroidism. He has a history of PSVT and has been treated with beta blocker therapy.  He has rare nonsustained palpitations  Past Medical History  Diagnosis Date  . Headache(784.0)   . GERD (gastroesophageal reflux disease)   . Hx of colonic polyps     Dr. Earlean Shawl removed 2 in 2009  . History of cystoscopy 1965  . History of hernia repair     left side:1991and 1978 right side: 2009  . Thyroid disease     hypothyroidism  . Hyperlipidemia   . Anemia   . Hx of benign prostatic hypertrophy   . Shortness of breath     with exertion   . Sleep apnea     2008 no CPAP machine   . Hypothyroidism   . H/O hiatal hernia   . SVT (supraventricular tachycardia)     2002     History   Social History  . Marital Status: Married    Spouse Name: N/A    Number of Children: N/A  . Years of Education: N/A   Occupational History  . Not on file.   Social History Main Topics  . Smoking status: Former Research scientist (life sciences)  . Smokeless tobacco: Never Used  . Alcohol Use: 0.6 oz/week    1 Glasses of wine per week     Comment: 2 nites per week   . Drug Use: No  . Sexual Activity: No   Other Topics Concern  . Not on file   Social History Narrative  . No narrative on file    Past Surgical History  Procedure Laterality Date  . Thyroidectomy  1967  . Bilateral hernia  repair  [id number]  . Cystocopy  1965  . Pneumonia  79 years old  . Borkne bones foot  79 yrs old  . Broken finger  79 yrs old  . Skin cancer face last ck on 08-22-07    . Hernia repair  1991, 1998, 2009  . Laparoscopic nissen fundoplication  10/06/298    Procedure: LAPAROSCOPIC NISSEN FUNDOPLICATION;  Surgeon: Edward Jolly, MD;  Location: [facility];  Service: General;  Laterality: N/A;  Laparoscopic Repair of Large Hiatal Hernia with Nissen Fundoplication with mesh     Family History  Problem Relation Age of Onset  . Cancer Mother 18    breast  . Heart disease Mother   . Dementia Mother 43  . Thyroid disease Mother   . Macular degeneration Mother   . Cancer Maternal Grandmother   . Cancer Paternal Grandmother   . Heart disease Father   . Hypertension Father   . Heart failure Father   . Dementia Father     No Known Allergies  Current Outpatient Prescriptions on File Prior to Visit  Medication Sig  Dispense Refill  . acetaminophen (TYLENOL) 500 MG tablet Take 1,000 mg by mouth every 4 (four) hours as needed. For pain      . aspirin 81 MG tablet Take 81 mg by mouth every morning.       . Multiple Vitamins-Minerals (ICAPS AREDS FORMULA PO) Take 1 tablet by mouth 2 (two) times daily.      Marland Kitchen omeprazole (PRILOSEC) 20 MG capsule Take 20 mg by mouth daily.      . pseudoephedrine (SUDAFED) 30 MG tablet Take 60 mg by mouth every 4 (four) hours as needed. For sinus congestion      . vitamin B-12 (CYANOCOBALAMIN) 1000 MCG tablet Take 500 mcg by mouth daily. Patient states that he takes half tablet once a day       No current facility-administered medications on file prior to visit.    BP 120/70  Pulse 54  Temp(Src) 98.2 F (36.8 C) (Oral)  Resp 20  Ht 5\' 9"  (1.753 m)  Wt 177 lb (80.287 kg)  BMI 26.13 kg/m2  SpO2 97%   1. Risk factors, based on past  M,S,F history- cardiovascular risk factors unremarkable except for age and male sex  2.  Physical activities: Remains active  physically no exercise intolerance  3.  Depression/mood: No history depression or mood disorder  4.  Hearing: No deficits  5.  ADL's: Independent in all aspects of daily living 6.  Fall risk:  7.  Home safety: Low  8.  Height weight, and visual acuity; height and weight stable no change in visual acuity does have a history of vitamin D deficiency in the past  9.  Counseling: Active lifestyle more regular exercise all encouraged  10. Lab orders based on risk factors: Laboratory profile including TSH and lipid panel will be reviewed   11. Referral : Not appropriate at this time. Has had recent GI followup  12. Care plan: Continue a regular exercise program and heart healthy diet  13. Cognitive assessment: Alert and with normal affect. No cognitive dysfunction       Review of Systems  Constitutional: Negative for fever, chills, appetite change and fatigue.  HENT: Negative for hearing loss, ear pain, congestion, sore throat, trouble swallowing, neck stiffness, dental problem, voice change and tinnitus.   Eyes: Negative for pain, discharge and visual disturbance.  Respiratory: Negative for cough, chest tightness, wheezing and stridor.   Cardiovascular: Negative for chest pain, palpitations and leg swelling.  Gastrointestinal: Negative for nausea, vomiting, abdominal pain, diarrhea, constipation, blood in stool and abdominal distention.  Genitourinary: Negative for urgency, hematuria, flank pain, discharge, difficulty urinating and genital sores.  Musculoskeletal: Negative for myalgias, back pain, joint swelling, arthralgias and gait problem.  Skin: Negative for rash.  Neurological: Negative for dizziness, syncope, speech difficulty, weakness, numbness and headaches.  Hematological: Negative for adenopathy. Does not bruise/bleed easily.  Psychiatric/Behavioral: Negative for behavioral problems and dysphoric mood. The patient is not nervous/anxious.        Objective:   Physical  Exam  Constitutional: He is oriented to person, place, and time. He appears well-developed.  HENT:  Head: Normocephalic.  Right Ear: External ear normal.  Left Ear: External ear normal.       Low hanging soft palate with pharyngeal crowding  Eyes: Conjunctivae and EOM are normal.  Neck: Normal range of motion.  Cardiovascular: Normal rate and normal heart sounds.   Pulmonary/Chest: Breath sounds normal.  Abdominal: Bowel sounds are normal.       Nicely  healing laparoscopic scars  Musculoskeletal: Normal range of motion. He exhibits no edema and no tenderness.  Neurological: He is alert and oriented to person, place, and time.  Psychiatric: He has a normal mood and affect. His behavior is normal.  Genitourinary. Prostate +2 enlarged         Assessment & Plan:  Preventive health examination Status post Nissen fundoplication Moderate OSA.  History of dyslipidemia. We'll check a lipid profile Hypothyroidism. We'll check a TSH  All medications refilled  Review of Systems As above    Objective:   Physical Exam  Cardiovascular:  And posterior tibia pulses full.  Dorsalis pedis pulses are faint  Genitourinary:  Slight testicular atrophy      As above     Assessment & Plan:   Preventive health examination Status post Nissen fundoplication Moderate OSA.  History of dyslipidemia. We'll check a lipid profile Hypothyroidism. We'll check a TSH History of PSVT and syncope  All medications refilled

## 2014-03-31 ENCOUNTER — Encounter: Payer: Self-pay | Admitting: Internal Medicine

## 2014-03-31 ENCOUNTER — Ambulatory Visit (INDEPENDENT_AMBULATORY_CARE_PROVIDER_SITE_OTHER): Payer: Medicare Other | Admitting: Internal Medicine

## 2014-03-31 VITALS — BP 126/76 | HR 56 | Temp 98.0°F | Resp 20 | Ht 69.0 in | Wt 176.0 lb

## 2014-03-31 DIAGNOSIS — R42 Dizziness and giddiness: Secondary | ICD-10-CM | POA: Diagnosis not present

## 2014-03-31 DIAGNOSIS — Z8679 Personal history of other diseases of the circulatory system: Secondary | ICD-10-CM | POA: Diagnosis not present

## 2014-03-31 NOTE — Progress Notes (Signed)
Pre visit review using our clinic review tool, if applicable. No additional management support is needed unless otherwise documented below in the visit note. 

## 2014-03-31 NOTE — Patient Instructions (Signed)
Decrease metoprolol to 1 half tablet (12.5 mg) daily for the next 2 weeks, and then decrease metoprolol to 1 half tablet every other day for an additional 2 weeks, and then discontinue

## 2014-03-31 NOTE — Progress Notes (Signed)
Subjective:    Patient ID: [name], male    DOB: 1935-02-02, 79 y.o.   MRN: [id number]  HPI  79 year old patient who has a history of PSVT.  He has been on metoprolol since 2002.  Several years ago.  His dose was decreased to 25 mg daily.  He was seen 2 months ago for his preventive health examination and EKG revealed a sinus bradycardia with a rate of 49.  No history of hypertension. Over the past several days, he has had 2 episodes of dizziness associated with bending and stooping and and mild physical activities.  The patient denies any history of tachyarrhythmias No chest pain or shortness of breath  Past Medical History  Diagnosis Date  . Headache(784.0)   . GERD (gastroesophageal reflux disease)   . Hx of colonic polyps     Dr. Earlean Shawl removed 2 in 2009  . History of cystoscopy 1965  . History of hernia repair     left side:1991and 1978 right side: 2009  . Thyroid disease     hypothyroidism  . Hyperlipidemia   . Anemia   . Hx of benign prostatic hypertrophy   . Shortness of breath     with exertion   . Sleep apnea     2008 no CPAP machine   . Hypothyroidism   . H/O hiatal hernia   . SVT (supraventricular tachycardia)     2002     History   Social History  . Marital Status: Married    Spouse Name: N/A    Number of Children: N/A  . Years of Education: N/A   Occupational History  . Not on file.   Social History Main Topics  . Smoking status: Former Research scientist (life sciences)  . Smokeless tobacco: Never Used  . Alcohol Use: 0.6 oz/week    1 Glasses of wine per week     Comment: 2 nites per week   . Drug Use: No  . Sexual Activity: No   Other Topics Concern  . Not on file   Social History Narrative  . No narrative on file    Past Surgical History  Procedure Laterality Date  . Thyroidectomy  1967  . Bilateral hernia repair  [id number]  . Cystocopy  1965  . Pneumonia  79 years old  . Borkne bones foot  79 yrs old  . Broken finger  79 yrs old  . Skin cancer face last  ck on 08-22-07    . Hernia repair  1991, 1998, 2009  . Laparoscopic nissen fundoplication  02/05/5884    Procedure: LAPAROSCOPIC NISSEN FUNDOPLICATION;  Surgeon: Edward Jolly, MD;  Location: [facility];  Service: General;  Laterality: N/A;  Laparoscopic Repair of Large Hiatal Hernia with Nissen Fundoplication with mesh     Family History  Problem Relation Age of Onset  . Cancer Mother 43    breast  . Heart disease Mother   . Dementia Mother 74  . Thyroid disease Mother   . Macular degeneration Mother   . Cancer Maternal Grandmother   . Cancer Paternal Grandmother   . Heart disease Father   . Hypertension Father   . Heart failure Father   . Dementia Father     No Known Allergies  Current Outpatient Prescriptions on File Prior to Visit  Medication Sig Dispense Refill  . acetaminophen (TYLENOL) 500 MG tablet Take 1,000 mg by mouth every 4 (four) hours as needed. For pain      . aspirin 81 MG  tablet Take 81 mg by mouth every morning.       . diphenhydrAMINE (BENADRYL) 25 MG tablet Take 25-50 mg by mouth at bedtime.      Marland Kitchen levothyroxine (SYNTHROID, LEVOTHROID) 125 MCG tablet Take 1 tablet (125 mcg total) by mouth every morning.  90 tablet  3  . metoprolol succinate (TOPROL-XL) 25 MG 24 hr tablet Take 1 tablet (25 mg total) by mouth daily.  90 tablet  3  . Multiple Vitamins-Minerals (ICAPS AREDS FORMULA PO) Take 1 tablet by mouth 2 (two) times daily.      Marland Kitchen omeprazole (PRILOSEC) 20 MG capsule Take 20 mg by mouth daily.      . pseudoephedrine (SUDAFED) 30 MG tablet Take 60 mg by mouth every 4 (four) hours as needed. For sinus congestion      . vitamin B-12 (CYANOCOBALAMIN) 1000 MCG tablet Take 500 mcg by mouth daily. Patient states that he takes half tablet once a day       No current facility-administered medications on file prior to visit.    BP 126/76  Pulse 56  Temp(Src) 98 F (36.7 C) (Oral)  Resp 20  Ht 5\' 9"  (1.753 m)  Wt 176 lb (79.833 kg)  BMI 25.98 kg/m2  SpO2  97%     Review of Systems  Constitutional: Negative for fever, chills, appetite change and fatigue.  HENT: Negative for congestion, dental problem, ear pain, hearing loss, sore throat, tinnitus, trouble swallowing and voice change.   Eyes: Negative for pain, discharge and visual disturbance.  Respiratory: Negative for cough, chest tightness, wheezing and stridor.   Cardiovascular: Negative for chest pain, palpitations and leg swelling.  Gastrointestinal: Negative for nausea, vomiting, abdominal pain, diarrhea, constipation, blood in stool and abdominal distention.  Genitourinary: Negative for urgency, hematuria, flank pain, discharge, difficulty urinating and genital sores.  Musculoskeletal: Negative for arthralgias, back pain, gait problem, joint swelling, myalgias and neck stiffness.  Skin: Negative for rash.  Neurological: Positive for light-headedness. Negative for dizziness, syncope, speech difficulty, weakness, numbness and headaches.  Hematological: Negative for adenopathy. Does not bruise/bleed easily.  Psychiatric/Behavioral: Negative for behavioral problems and dysphoric mood. The patient is not nervous/anxious.        Objective:   Physical Exam  Constitutional: He is oriented to person, place, and time. He appears well-developed.  Blood pressure 110/70  HENT:  Head: Normocephalic.  Right Ear: External ear normal.  Left Ear: External ear normal.  Eyes: Conjunctivae and EOM are normal.  Neck: Normal range of motion.  Cardiovascular: Normal rate and normal heart sounds.   Pulse 56  Pulmonary/Chest: Breath sounds normal.  Abdominal: Bowel sounds are normal.  Musculoskeletal: Normal range of motion. He exhibits no edema and no tenderness.  Neurological: He is alert and oriented to person, place, and time.  Psychiatric: He has a normal mood and affect. His behavior is normal.          Assessment & Plan:   History of PSVT Episodic dizziness.  Possibly related to  hypotension.  We'll taper and discontinue beta blocker therapy.  Over the next 4 weeks and reassess in 6 weeks

## 2014-04-21 DIAGNOSIS — L819 Disorder of pigmentation, unspecified: Secondary | ICD-10-CM | POA: Diagnosis not present

## 2014-04-21 DIAGNOSIS — D235 Other benign neoplasm of skin of trunk: Secondary | ICD-10-CM | POA: Diagnosis not present

## 2014-04-21 DIAGNOSIS — L821 Other seborrheic keratosis: Secondary | ICD-10-CM | POA: Diagnosis not present

## 2014-05-12 ENCOUNTER — Encounter: Payer: Self-pay | Admitting: Internal Medicine

## 2014-05-12 ENCOUNTER — Ambulatory Visit (INDEPENDENT_AMBULATORY_CARE_PROVIDER_SITE_OTHER): Payer: Medicare Other | Admitting: Internal Medicine

## 2014-05-12 ENCOUNTER — Ambulatory Visit: Payer: Medicare Other | Admitting: Internal Medicine

## 2014-05-12 VITALS — BP 100/64 | HR 90 | Temp 98.0°F | Resp 20 | Ht 69.0 in | Wt 174.0 lb

## 2014-05-12 DIAGNOSIS — Z23 Encounter for immunization: Secondary | ICD-10-CM | POA: Diagnosis not present

## 2014-05-12 DIAGNOSIS — Z8679 Personal history of other diseases of the circulatory system: Secondary | ICD-10-CM

## 2014-05-12 NOTE — Progress Notes (Signed)
Pre visit review using our clinic review tool, if applicable. No additional management support is needed unless otherwise documented below in the visit note. 

## 2014-05-12 NOTE — Progress Notes (Signed)
Subjective:    Patient ID: [name], male    DOB: [birth year], 79 y.o.   MRN: [id number]  HPI  03/31/14. 79 year old patient who has a history of PSVT. He has been on metoprolol since 2002. Several years ago. His dose was decreased to 25 mg daily. He was seen 2 months ago for his preventive health examination and EKG revealed a sinus bradycardia with a rate of 49. No history of hypertension.  Over the past several days, he has had 2 episodes of dizziness associated with bending and stooping and and mild physical activities. The patient denies any history of tachyarrhythmias  05/12/14 The patient seen today for his 6 week followup.  When seen on September 1, it was felt his symptoms were perhaps related to orthostatic hypotension. Over the past 6 weeks, metoprolol was slowly tapered and discontinued.  Over the past 6 weeks, he has had 3 episodes of tachyarrhythmias associated with dizziness and weakness.  He feels that all episodes are related to the tachyarrhythmia that he feels are sometimes precipitated by bending and stooping. Blood pressure today is low normal.  He had a single episode of heart rate to 160; the other 2 episodes occurred with pulse rate approximately 100. Prior to initiating beta blocker therapy in 2002 he experienced 2 near syncopal episodes.  Past Medical History  Diagnosis Date  . Headache(784.0)   . GERD (gastroesophageal reflux disease)   . Hx of colonic polyps     Dr. Earlean Shawl removed 2 in 2009  . History of cystoscopy 1965  . History of hernia repair     left side:1991and 1978 right side: 2009  . Thyroid disease     hypothyroidism  . Hyperlipidemia   . Anemia   . Hx of benign prostatic hypertrophy   . Shortness of breath     with exertion   . Sleep apnea     2008 no CPAP machine   . Hypothyroidism   . H/O hiatal hernia   . SVT (supraventricular tachycardia)     2002     History   Social History  . Marital Status: Married    Spouse Name: N/A   Number of Children: N/A  . Years of Education: N/A   Occupational History  . Not on file.   Social History Main Topics  . Smoking status: Former Research scientist (life sciences)  . Smokeless tobacco: Never Used  . Alcohol Use: 0.6 oz/week    1 Glasses of wine per week     Comment: 2 nites per week   . Drug Use: No  . Sexual Activity: No   Other Topics Concern  . Not on file   Social History Narrative  . No narrative on file    Past Surgical History  Procedure Laterality Date  . Thyroidectomy  1967  . Bilateral hernia repair  [id number]  . Cystocopy  1965  . Pneumonia  79 years old  . Borkne bones foot  79 yrs old  . Broken finger  79 yrs old  . Skin cancer face last ck on 08-22-07    . Hernia repair  1991, 1998, 2009  . Laparoscopic nissen fundoplication  0/02/6760    Procedure: LAPAROSCOPIC NISSEN FUNDOPLICATION;  Surgeon: Edward Jolly, MD;  Location: [facility];  Service: General;  Laterality: N/A;  Laparoscopic Repair of Large Hiatal Hernia with Nissen Fundoplication with mesh     Family History  Problem Relation Age of Onset  . Cancer Mother 8    breast  .  Heart disease Mother   . Dementia Mother 56  . Thyroid disease Mother   . Macular degeneration Mother   . Cancer Maternal Grandmother   . Cancer Paternal Grandmother   . Heart disease Father   . Hypertension Father   . Heart failure Father   . Dementia Father     No Known Allergies  Current Outpatient Prescriptions on File Prior to Visit  Medication Sig Dispense Refill  . acetaminophen (TYLENOL) 500 MG tablet Take 1,000 mg by mouth every 4 (four) hours as needed. For pain      . aspirin 81 MG tablet Take 81 mg by mouth every morning.       . diphenhydrAMINE (BENADRYL) 25 MG tablet Take 25-50 mg by mouth at bedtime.      Marland Kitchen levothyroxine (SYNTHROID, LEVOTHROID) 125 MCG tablet Take 1 tablet (125 mcg total) by mouth every morning.  90 tablet  3  . Multiple Vitamins-Minerals (ICAPS AREDS FORMULA PO) Take 1 tablet by mouth 2 (two)  times daily.      Marland Kitchen omeprazole (PRILOSEC) 20 MG capsule Take 20 mg by mouth daily.      . pseudoephedrine (SUDAFED) 30 MG tablet Take 60 mg by mouth every 4 (four) hours as needed. For sinus congestion      . vitamin B-12 (CYANOCOBALAMIN) 1000 MCG tablet Take 500 mcg by mouth daily. Patient states that he takes half tablet once a day       No current facility-administered medications on file prior to visit.    BP 100/64  Pulse 90  Temp(Src) 98 F (36.7 C) (Oral)  Resp 20  Ht 5\' 9"  (1.753 m)  Wt 174 lb (78.926 kg)  BMI 25.68 kg/m2  SpO2 97%     Review of Systems  Constitutional: Negative for fever, chills, appetite change and fatigue.  HENT: Negative for congestion, dental problem, ear pain, hearing loss, sore throat, tinnitus, trouble swallowing and voice change.   Eyes: Negative for pain, discharge and visual disturbance.  Respiratory: Negative for cough, chest tightness, wheezing and stridor.   Cardiovascular: Negative for chest pain, palpitations and leg swelling.  Gastrointestinal: Negative for nausea, vomiting, abdominal pain, diarrhea, constipation, blood in stool and abdominal distention.  Genitourinary: Negative for urgency, hematuria, flank pain, discharge, difficulty urinating and genital sores.  Musculoskeletal: Negative for arthralgias, back pain, gait problem, joint swelling, myalgias and neck stiffness.  Skin: Negative for rash.  Neurological: Positive for weakness and light-headedness. Negative for dizziness, syncope, speech difficulty, numbness and headaches.  Hematological: Negative for adenopathy. Does not bruise/bleed easily.  Psychiatric/Behavioral: Negative for behavioral problems and dysphoric mood. The patient is not nervous/anxious.        Objective:   Physical Exam  Constitutional: He appears well-developed and well-nourished. No distress.  Blood pressure 100/64 Repeat blood pressure 110/70  Cardiovascular:  Frequent ectopics Rate 80-90           Assessment & Plan:  Symptomatic tachyarrhythmias/history of PSVT. The patient was asked resume his prior dose of metoprolol 25 mg daily.  Symptoms were not well controlled on this dose, but reluctant to increase due to  history of bradycardia.  We'll arrange cardiology followup.

## 2014-05-12 NOTE — Patient Instructions (Signed)
Followup cardiology as discussed Resume prior dose of metoprolol 25 mg daily   Call or return to clinic prn if these symptoms worsen or fail to improve as anticipated.

## 2014-05-18 ENCOUNTER — Ambulatory Visit (INDEPENDENT_AMBULATORY_CARE_PROVIDER_SITE_OTHER): Payer: Medicare Other

## 2014-05-18 ENCOUNTER — Ambulatory Visit (INDEPENDENT_AMBULATORY_CARE_PROVIDER_SITE_OTHER): Payer: Medicare Other | Admitting: Internal Medicine

## 2014-05-18 VITALS — BP 118/68 | HR 63 | Temp 98.0°F | Resp 17 | Ht 68.0 in | Wt 174.0 lb

## 2014-05-18 DIAGNOSIS — S20219A Contusion of unspecified front wall of thorax, initial encounter: Secondary | ICD-10-CM | POA: Diagnosis not present

## 2014-05-18 DIAGNOSIS — R0789 Other chest pain: Secondary | ICD-10-CM | POA: Diagnosis not present

## 2014-05-18 MED ORDER — HYDROCODONE-ACETAMINOPHEN 5-325 MG PO TABS: 1.0000 | ORAL_TABLET | Freq: Four times a day (QID) | ORAL | Status: DC | PRN

## 2014-05-18 NOTE — Progress Notes (Signed)
   Subjective:    Patient ID: [name], male    DOB: 1935-02-28, 79 y.o.   MRN: [id number]  HPI At home pulling with chest and arms felt pop and pain mid left anterior ribs yesterday. No sob, no hemoptysis. Normally healthy.   Review of Systems     Objective:   Physical Exam  Vitals reviewed. Constitutional: He is oriented to person, place, and time. He appears well-developed and well-nourished. No distress.  HENT:  Head: Normocephalic.  Mouth/Throat: Oropharynx is clear and moist.  Eyes: EOM are normal.  Cardiovascular: Normal rate, regular rhythm and normal heart sounds.   Pulmonary/Chest: Effort normal and breath sounds normal. No respiratory distress. He has no wheezes. He has no rales. He exhibits tenderness.  Abdominal: Soft. There is no tenderness.  Musculoskeletal: Normal range of motion. He exhibits tenderness.  Neurological: He is alert and oriented to person, place, and time. He exhibits normal muscle tone. Coordination normal.  Skin: Skin is intact. No abrasion and no ecchymosis noted. No erythema.     Tender at x  Psychiatric: He has a normal mood and affect. His behavior is normal.      [facility] reading (PRIMARY) by  Dr.Guest no fx seen      Assessment & Plan:  Contusion/strain chest wall

## 2014-05-18 NOTE — Patient Instructions (Signed)

## 2014-06-11 ENCOUNTER — Encounter: Payer: Self-pay | Admitting: Cardiovascular Disease

## 2014-06-11 ENCOUNTER — Ambulatory Visit (INDEPENDENT_AMBULATORY_CARE_PROVIDER_SITE_OTHER): Payer: Medicare Other | Admitting: Cardiovascular Disease

## 2014-06-11 VITALS — BP 112/68 | HR 61 | Ht 69.5 in | Wt 174.0 lb

## 2014-06-11 DIAGNOSIS — I471 Supraventricular tachycardia: Secondary | ICD-10-CM

## 2014-06-11 NOTE — Progress Notes (Signed)
[name] Date of Birth  Jul 01, 1935       [facility]    [employer] [address], [address] [place], [place], [place]  [address]   [place], [place]  [address] [address]   Fax  [phone]     Fax [phone]  Problem List: 1. History of supraventricular tachycardia 2. Orthostatic hypotension  History of Present Illness:  [name] is a 79 yo with hx of SVT ( originally diagnosed by Dr. Caryl Comes in 2002)    He was started on metoprolol. He did fairly well. He was thought to possibly have sleep apnea. His wife states that he snores at night. Several months ago he started having fatigue. Dr. Raliegh Ip  thought that he might be having some problems related to metoprolol and the dose was decreased.  He also has some orthostatic hypotension and his metoprolol was stopped completely.  He then presented to Dr. Marthann Schiller office and was found to have a "  HR of 160 ".  No ECG was done at that time. He brought her blood pressure and heart rate log with him. His heart rate typically is in the 60s. In September he started decreasing his metoprolol slightly. his baseline heart rate stayed in the 60-70 range and occasional episodes of 99 or the low 100s..  As metoprolol dose was decreased, he thinks that his fatigue it gradually improved but the episodes of tachycardia certainly worsened.  He does not exercise on a regular basis.  Very active but no aerobic exercise.  Does lot of chores on 3 houses.   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      He is a retired in Teacher, music)    Current Outpatient Prescriptions on File Prior to Visit  Medication Sig Dispense Refill  . acetaminophen (TYLENOL) 500 MG tablet Take 1,000 mg by mouth every 4 (four) hours as needed. For pain    . aspirin 81 MG tablet Take 81 mg by mouth every morning.     . diphenhydrAMINE (BENADRYL) 25 MG tablet Take 25-50 mg by mouth at bedtime.      Marland Kitchen HYDROcodone-acetaminophen (NORCO/VICODIN) 5-325 MG per tablet Take 1 tablet by mouth every 6 (six) hours as needed. (Patient taking differently: Take 1 tablet by mouth every 6 (six) hours as needed (pain). ) 30 tablet 0  . levothyroxine (SYNTHROID, LEVOTHROID) 125 MCG tablet Take 1 tablet (125 mcg total) by mouth every morning. 90 tablet 3  . Multiple Vitamins-Minerals (ICAPS AREDS FORMULA PO) Take 1 tablet by mouth 2 (two) times daily.    Marland Kitchen omeprazole (PRILOSEC) 20 MG capsule Take 20 mg by mouth daily.    . pseudoephedrine (SUDAFED) 30 MG tablet Take 60 mg by mouth every 4 (four) hours as needed. For sinus congestion    . vitamin B-12 (CYANOCOBALAMIN) 1000 MCG tablet Take 500 mcg by mouth daily. Patient states that he takes half tablet once a day     No current facility-administered medications on file prior to visit.    No Known Allergies  Past Medical History  Diagnosis Date  . Headache(784.0)   . GERD (gastroesophageal reflux disease)   . Hx of colonic polyps     Dr. Earlean Shawl removed 2 in 2009  . History of cystoscopy 1965  . History of hernia repair     left side:1991and 1978 right side: 2009  .  Thyroid disease     hypothyroidism  . Hyperlipidemia   . Anemia   . Hx of benign prostatic hypertrophy   . Shortness of breath     with exertion   . Sleep apnea     2008 no CPAP machine   . Hypothyroidism   . H/O hiatal hernia   . SVT (supraventricular tachycardia)     2002     Past Surgical History  Procedure Laterality Date  . Thyroidectomy  1967  . Bilateral hernia repair  [id number]  . Cystocopy  1965  . Pneumonia  79 years old  . Borkne bones foot  79 yrs old  . Broken finger  79 yrs old  . Skin cancer face last ck on 08-22-07    . Hernia repair  1991, 1998, 2009  . Laparoscopic nissen fundoplication  09/29/5174    Procedure: LAPAROSCOPIC NISSEN FUNDOPLICATION;  Surgeon: Edward Jolly, MD;  Location: [facility];  Service: General;  Laterality: N/A;  Laparoscopic Repair  of Large Hiatal Hernia with Nissen Fundoplication with mesh     History  Smoking status  . Former Smoker  Smokeless tobacco  . Never Used    History  Alcohol Use  . 0.6 oz/week  . 1 Glasses of wine per week    Comment: 2 nites per week     Family History  Problem Relation Age of Onset  . Cancer Mother 35    breast  . Heart disease Mother   . Dementia Mother 19  . Thyroid disease Mother   . Macular degeneration Mother   . Cancer Maternal Grandmother   . Cancer Paternal Grandmother   . Heart disease Father   . Hypertension Father   . Heart failure Father   . Dementia Father     Reviw of Systems:  Reviewed in the HPI.  All other systems are negative.  Physical Exam: Blood pressure 112/68, pulse 61, height 5' 9.5" (1.765 m), weight 174 lb (78.926 kg). Wt Readings from Last 3 Encounters:  06/11/14 174 lb (78.926 kg)  05/18/14 174 lb (78.926 kg)  05/12/14 174 lb (78.926 kg)     General: Well developed, well nourished, in no acute distress.  Head: Normocephalic, atraumatic, sclera non-icteric, mucus membranes are moist,   Neck: Supple. Carotids are 2 + without bruits. No JVD   Lungs: Clear   Heart: RR, normal S1S2  Abdomen: Soft, non-tender, non-distended with normal bowel sounds.  Msk:  Strength and tone are normal   Extremities: No clubbing or cyanosis. No edema.  Distal pedal pulses are 2+ and equal    Neuro: CN II - XII intact.  Alert and oriented X 3.   Psych:  Normal   ECG: 06/11/2014: Sinus bradycardia at 59. He has a first degree AV block. Nonspecific T wave abnormality.  Assessment / Plan:

## 2014-06-11 NOTE — Patient Instructions (Addendum)
Increase your intake of fluids (water with electrolyte tabs like Nun tablets, or gatorade) , protein ( hard boiled eggs, chicken, fish) , and a electrolytes ( V-8 juice, salt, potassium chloride  which is sold as No-Salt  Your physician recommends that you continue on your current medications as directed. Please refer to the Current Medication list given to you today.  Your physician recommends that you schedule a follow-up appointment in: 3-4 months with Dr. Acie Fredrickson.

## 2014-06-11 NOTE — Assessment & Plan Note (Signed)
[name] seems to be doing okay. He has a history of SVT but has been fairly stable on metoprolol therapy. His dose of metoprolol was recently decreased because of some symptoms of fatigue but his episodes of tachycardia increased. He now been restarted on the metoprolol and feels a bit better. He has has occasional episodes of lightheadedness that was probably due to dehydration. I've given him some instructions on staying hydrated.  I've explained the Valsalva maneuver and also instructed him in the diving reflex.  I will see him in again and 3-4 months for followup visit.

## 2014-06-24 DIAGNOSIS — H2513 Age-related nuclear cataract, bilateral: Secondary | ICD-10-CM | POA: Diagnosis not present

## 2014-06-24 DIAGNOSIS — H3531 Nonexudative age-related macular degeneration: Secondary | ICD-10-CM | POA: Diagnosis not present

## 2014-06-24 DIAGNOSIS — H25013 Cortical age-related cataract, bilateral: Secondary | ICD-10-CM | POA: Diagnosis not present

## 2014-08-06 DIAGNOSIS — K229 Disease of esophagus, unspecified: Secondary | ICD-10-CM | POA: Diagnosis not present

## 2014-08-06 DIAGNOSIS — K5730 Diverticulosis of large intestine without perforation or abscess without bleeding: Secondary | ICD-10-CM | POA: Diagnosis not present

## 2014-08-06 DIAGNOSIS — D125 Benign neoplasm of sigmoid colon: Secondary | ICD-10-CM | POA: Diagnosis not present

## 2014-08-06 DIAGNOSIS — K635 Polyp of colon: Secondary | ICD-10-CM | POA: Diagnosis not present

## 2014-08-06 DIAGNOSIS — Z86010 Personal history of colonic polyps: Secondary | ICD-10-CM | POA: Diagnosis not present

## 2014-08-06 DIAGNOSIS — R1310 Dysphagia, unspecified: Secondary | ICD-10-CM | POA: Diagnosis not present

## 2014-08-10 DIAGNOSIS — H5203 Hypermetropia, bilateral: Secondary | ICD-10-CM | POA: Diagnosis not present

## 2014-08-10 DIAGNOSIS — H43813 Vitreous degeneration, bilateral: Secondary | ICD-10-CM | POA: Diagnosis not present

## 2014-08-10 DIAGNOSIS — H3531 Nonexudative age-related macular degeneration: Secondary | ICD-10-CM | POA: Diagnosis not present

## 2014-08-10 DIAGNOSIS — H2513 Age-related nuclear cataract, bilateral: Secondary | ICD-10-CM | POA: Diagnosis not present

## 2014-09-08 DIAGNOSIS — H3531 Nonexudative age-related macular degeneration: Secondary | ICD-10-CM | POA: Diagnosis not present

## 2014-09-08 DIAGNOSIS — H25041 Posterior subcapsular polar age-related cataract, right eye: Secondary | ICD-10-CM | POA: Diagnosis not present

## 2014-09-08 DIAGNOSIS — H25031 Anterior subcapsular polar age-related cataract, right eye: Secondary | ICD-10-CM | POA: Diagnosis not present

## 2014-09-08 DIAGNOSIS — H25811 Combined forms of age-related cataract, right eye: Secondary | ICD-10-CM | POA: Diagnosis not present

## 2014-09-08 DIAGNOSIS — H2511 Age-related nuclear cataract, right eye: Secondary | ICD-10-CM | POA: Diagnosis not present

## 2014-09-08 DIAGNOSIS — H25011 Cortical age-related cataract, right eye: Secondary | ICD-10-CM | POA: Diagnosis not present

## 2014-09-16 ENCOUNTER — Ambulatory Visit: Payer: Medicare Other | Admitting: Cardiovascular Disease

## 2014-09-17 DIAGNOSIS — H3531 Nonexudative age-related macular degeneration: Secondary | ICD-10-CM | POA: Diagnosis not present

## 2014-09-24 ENCOUNTER — Ambulatory Visit: Payer: Medicare Other | Admitting: Cardiovascular Disease

## 2014-09-29 ENCOUNTER — Encounter: Payer: Self-pay | Admitting: Internal Medicine

## 2014-09-29 ENCOUNTER — Ambulatory Visit (INDEPENDENT_AMBULATORY_CARE_PROVIDER_SITE_OTHER): Payer: Medicare Other | Admitting: Internal Medicine

## 2014-09-29 VITALS — BP 110/70 | HR 81 | Temp 98.2°F | Resp 20 | Ht 69.5 in | Wt 175.0 lb

## 2014-09-29 DIAGNOSIS — Z8679 Personal history of other diseases of the circulatory system: Secondary | ICD-10-CM

## 2014-09-29 DIAGNOSIS — J069 Acute upper respiratory infection, unspecified: Secondary | ICD-10-CM | POA: Diagnosis not present

## 2014-09-29 DIAGNOSIS — R0789 Other chest pain: Secondary | ICD-10-CM | POA: Diagnosis not present

## 2014-09-29 DIAGNOSIS — S20219A Contusion of unspecified front wall of thorax, initial encounter: Secondary | ICD-10-CM | POA: Diagnosis not present

## 2014-09-29 DIAGNOSIS — B9789 Other viral agents as the cause of diseases classified elsewhere: Secondary | ICD-10-CM

## 2014-09-29 MED ORDER — HYDROCODONE-HOMATROPINE 5-1.5 MG/5ML PO SYRP: 5.0000 mL | ORAL_SOLUTION | Freq: Four times a day (QID) | ORAL | Status: DC | PRN

## 2014-09-29 MED ORDER — HYDROCODONE-ACETAMINOPHEN 5-325 MG PO TABS: 1.0000 | ORAL_TABLET | Freq: Four times a day (QID) | ORAL | Status: DC | PRN

## 2014-09-29 NOTE — Progress Notes (Signed)
Subjective:    Patient ID: [name], male    DOB: 05/12/1935, 80 y.o.   MRN: [id number]  HPI  80 year old patient who is seen accompanied by his wife.  They have both had a URI for the past 11 days.  He is had persistent cough associated with anterior chest wall pain.  Cough interferes with sleep and he has been quite uncomfortable.  Earlier he had some associated headaches, sinus congestion and generalized myalgias.  There is been no documented fever.  Past Medical History  Diagnosis Date  . Headache(784.0)   . GERD (gastroesophageal reflux disease)   . Hx of colonic polyps     Dr. Earlean Shawl removed 2 in 2009  . History of cystoscopy 1965  . History of hernia repair     left side:1991and 1978 right side: 2009  . Thyroid disease     hypothyroidism  . Hyperlipidemia   . Anemia   . Hx of benign prostatic hypertrophy   . Shortness of breath     with exertion   . Sleep apnea     2008 no CPAP machine   . Hypothyroidism   . H/O hiatal hernia   . SVT (supraventricular tachycardia)     2002     History   Social History  . Marital Status: Married    Spouse Name: N/A  . Number of Children: N/A  . Years of Education: N/A   Occupational History  . Not on file.   Social History Main Topics  . Smoking status: Former Research scientist (life sciences)  . Smokeless tobacco: Never Used  . Alcohol Use: 0.6 oz/week    1 Glasses of wine per week     Comment: 2 nites per week   . Drug Use: No  . Sexual Activity: No   Other Topics Concern  . Not on file   Social History Narrative    Past Surgical History  Procedure Laterality Date  . Thyroidectomy  1967  . Bilateral hernia repair  [id number]  . Cystocopy  1965  . Pneumonia  80 years old  . Borkne bones foot  80 yrs old  . Broken finger  80 yrs old  . Skin cancer face last ck on 08-22-07    . Hernia repair  1991, 1998, 2009  . Laparoscopic nissen fundoplication  10/30/7406    Procedure: LAPAROSCOPIC NISSEN FUNDOPLICATION;  Surgeon: Edward Jolly, MD;  Location: [facility];  Service: General;  Laterality: N/A;  Laparoscopic Repair of Large Hiatal Hernia with Nissen Fundoplication with mesh     Family History  Problem Relation Age of Onset  . Cancer Mother 27    breast  . Heart disease Mother   . Dementia Mother 50  . Thyroid disease Mother   . Macular degeneration Mother   . Cancer Maternal Grandmother   . Cancer Paternal Grandmother   . Heart disease Father   . Hypertension Father   . Heart failure Father   . Dementia Father     No Known Allergies  Current Outpatient Prescriptions on File Prior to Visit  Medication Sig Dispense Refill  . acetaminophen (TYLENOL) 500 MG tablet Take 1,000 mg by mouth every 4 (four) hours as needed. For pain    . aspirin 81 MG tablet Take 81 mg by mouth every morning.     . diphenhydrAMINE (BENADRYL) 25 MG tablet Take 25-50 mg by mouth at bedtime.    Marland Kitchen levothyroxine (SYNTHROID, LEVOTHROID) 125 MCG tablet Take 1 tablet (125 mcg  total) by mouth every morning. 90 tablet 3  . metoprolol succinate (TOPROL-XL) 25 MG 24 hr tablet Take 1 tablet by mouth daily.  2  . Multiple Vitamins-Minerals (ICAPS AREDS FORMULA PO) Take 1 tablet by mouth 2 (two) times daily.    Marland Kitchen omeprazole (PRILOSEC) 20 MG capsule Take 20 mg by mouth daily.    . pseudoephedrine (SUDAFED) 30 MG tablet Take 60 mg by mouth every 4 (four) hours as needed. For sinus congestion    . vitamin B-12 (CYANOCOBALAMIN) 1000 MCG tablet Take 500 mcg by mouth daily. Patient states that he takes half tablet once a day     No current facility-administered medications on file prior to visit.    BP 110/70 mmHg  Pulse 81  Temp(Src) 98.2 F (36.8 C) (Oral)  Resp 20  Ht 5' 9.5" (1.765 m)  Wt 175 lb (79.379 kg)  BMI 25.48 kg/m2  SpO2 96%     Review of Systems  Constitutional: Positive for activity change, appetite change and fatigue. Negative for fever and chills.  HENT: Positive for congestion and sinus pressure. Negative for dental  problem, ear pain, hearing loss, sore throat, tinnitus, trouble swallowing and voice change.   Eyes: Negative for pain, discharge and visual disturbance.  Respiratory: Positive for cough. Negative for chest tightness, wheezing and stridor.   Cardiovascular: Negative for chest pain, palpitations and leg swelling.  Gastrointestinal: Negative for nausea, vomiting, abdominal pain, diarrhea, constipation, blood in stool and abdominal distention.  Genitourinary: Negative for urgency, hematuria, flank pain, discharge, difficulty urinating and genital sores.  Musculoskeletal: Negative for myalgias, back pain, joint swelling, arthralgias, gait problem and neck stiffness.  Skin: Negative for rash.  Neurological: Negative for dizziness, syncope, speech difficulty, weakness, numbness and headaches.  Hematological: Negative for adenopathy. Does not bruise/bleed easily.  Psychiatric/Behavioral: Negative for behavioral problems and dysphoric mood. The patient is not nervous/anxious.        Objective:   Physical Exam  Constitutional: He is oriented to person, place, and time. He appears well-developed.   Afebrile Frequent paroxysms of coughing  HENT:  Head: Normocephalic.  Right Ear: External ear normal.  Left Ear: External ear normal.  Eyes: Conjunctivae and EOM are normal.  Neck: Normal range of motion.  Cardiovascular: Normal rate and normal heart sounds.   Pulmonary/Chest: Breath sounds normal. No respiratory distress. He has no wheezes. He has no rales.  Abdominal: Bowel sounds are normal.  Musculoskeletal: Normal range of motion. He exhibits no edema or tenderness.  Neurological: He is alert and oriented to person, place, and time.  Psychiatric: He has a normal mood and affect. His behavior is normal.          Assessment & Plan:   Viral URI with cough.  Will treat symptomatically History rhinitis Dyslipidemia  Patient instructions dispensed

## 2014-09-29 NOTE — Progress Notes (Signed)
Pre visit review using our clinic review tool, if applicable. No additional management support is needed unless otherwise documented below in the visit note. 

## 2014-09-29 NOTE — Patient Instructions (Signed)
Acute bronchitis symptoms are generally not helped by antibiotics.  Take over-the-counter expectorants and cough medications such as  Mucinex DM.  Call if there is no improvement in 5 to 7 days or if  you develop worsening cough, fever, or new symptoms, such as shortness of breath or chest pain.  TREATMENT  Acute bronchitis usually goes away in a couple weeks. Oftentimes, no medical treatment is necessary. Medicines are sometimes given for relief of fever or cough. Antibiotic medicines are usually not needed but may be prescribed in certain situations. In some cases, an inhaler may be recommended to help reduce shortness of breath and control the cough. A cool mist vaporizer may also be used to help thin bronchial secretions and make it easier to clear the chest.  HOME CARE INSTRUCTIONS  Get plenty of rest.  Drink enough fluids to keep your urine clear or pale yellow (unless you have a medical condition that requires fluid restriction). Increasing fluids may help thin your respiratory secretions (sputum) and reduce chest congestion, and it will prevent dehydration.   Avoid smoking and secondhand smoke. Exposure to cigarette smoke or irritating chemicals will make bronchitis worse. If you are a smoker, consider using nicotine gum or skin patches to help control withdrawal symptoms. Quitting smoking will help your lungs heal faster.  Reduce the chances of another bout of acute bronchitis by washing your hands frequently, avoiding people with cold symptoms, and trying not to touch your hands to your mouth, nose, or eyes.    SEEK IMMEDIATE MEDICAL CARE IF:  You develop an increased fever or chills.  You have chest pain.  You have severe shortness of breath.  You have bloody sputum.  You develop dehydration.  You faint or repeatedly feel like you are going to pass out.  You develop repeated vomiting.  You develop a severe headache.

## 2014-10-20 ENCOUNTER — Telehealth: Payer: Self-pay | Admitting: Internal Medicine

## 2014-10-20 NOTE — Telephone Encounter (Signed)
Patient Name: [name] DOB: [birth year] Initial Comment Caller states husband is having a nosebleed. It started through out night and stopped has been bleeding now for 15 minutes, Dripping a lot he is standing over trash can right now Nurse Assessment Nurse: Vallery Sa, RN, [place] Date/Time (Eastern Time): 10/20/2014 9:47:22 AM Confirm and document reason for call. If symptomatic, describe symptoms. ---Caller states her husband developed another nosebleed this morning. No injury in the morning. Has the patient traveled out of the country within the last 30 days? ---No Does the patient require triage? ---Yes Related visit to physician within the last 2 weeks? ---No Does the PT have any chronic conditions? (i.e. diabetes, asthma, etc.) ---Yes List chronic conditions. ---Hiatal Hernia, Thyroid problems, Tachycardia Guidelines Guideline Title Affirmed Question Affirmed Notes Nosebleed [1] Bleeding recurs 3 or more times in 24 hours AND [2] direct pressure applied correctly Final Disposition User See Physician within [place], RN, T[place] Comments Appointment scheduled for 9:30am tomorrow with Dr. Elease Hashimoto. Encouraged to call back as needed.

## 2014-10-21 ENCOUNTER — Encounter: Payer: Self-pay | Admitting: Family Medicine

## 2014-10-21 ENCOUNTER — Ambulatory Visit (INDEPENDENT_AMBULATORY_CARE_PROVIDER_SITE_OTHER): Payer: Medicare Other | Admitting: Family Medicine

## 2014-10-21 VITALS — BP 120/70 | HR 70 | Temp 98.4°F | Wt 168.0 lb

## 2014-10-21 DIAGNOSIS — R040 Epistaxis: Secondary | ICD-10-CM | POA: Diagnosis not present

## 2014-10-21 NOTE — Progress Notes (Signed)
   Subjective:    Patient ID: [name], male    DOB: 19-May-1935, 80 y.o.   MRN: [id number]  HPI  Patient seen with nosebleed. About 4 days ago developed right-sided nosebleed. Had about 4 episodes altogether. Consistently from the right side. He does use CPAP with pillow mask. He generally takes aspirin but stopped this several days ago because of anticipated cataract surgery. He has not had any bleeding from gums or any other sites of bleeding. No bruising. He was able controlled nosebleed with ice and pressure. No history of recurrent nosebleeds in past.  Past Medical History  Diagnosis Date  . Headache(784.0)   . GERD (gastroesophageal reflux disease)   . Hx of colonic polyps     Dr. Earlean Shawl removed 2 in 2009  . History of cystoscopy 1965  . History of hernia repair     left side:1991and 1978 right side: 2009  . Thyroid disease     hypothyroidism  . Hyperlipidemia   . Anemia   . Hx of benign prostatic hypertrophy   . Shortness of breath     with exertion   . Sleep apnea     2008 no CPAP machine   . Hypothyroidism   . H/O hiatal hernia   . SVT (supraventricular tachycardia)     2002    Past Surgical History  Procedure Laterality Date  . Thyroidectomy  1967  . Bilateral hernia repair  [id number]  . Cystocopy  1965  . Pneumonia  80 years old  . Borkne bones foot  80 yrs old  . Broken finger  80 yrs old  . Skin cancer face last ck on 08-22-07    . Hernia repair  1991, 1998, 2009  . Laparoscopic nissen fundoplication  12/06/5636    Procedure: LAPAROSCOPIC NISSEN FUNDOPLICATION;  Surgeon: Edward Jolly, MD;  Location: [facility];  Service: General;  Laterality: N/A;  Laparoscopic Repair of Large Hiatal Hernia with Nissen Fundoplication with mesh     reports that he has quit smoking. He has never used smokeless tobacco. He reports that he drinks about 0.6 oz of alcohol per week. He reports that he does not use illicit drugs. family history includes Cancer in his maternal  grandmother and paternal grandmother; Cancer (age of onset: 67) in his mother; Dementia in his father; Dementia (age of onset: 65) in his mother; Heart disease in his father and mother; Heart failure in his father; Hypertension in his father; Macular degeneration in his mother; Thyroid disease in his mother. No Known Allergies   Review of Systems  Constitutional: Negative for fever and chills.  Neurological: Negative for headaches.  Hematological: Does not bruise/bleed easily.       Objective:   Physical Exam  Constitutional: He appears well-developed and well-nourished.  HENT:  Mouth/Throat: Oropharynx is clear and moist.  Minimal diffuse erythema anterior nasal mucosa right and left side. No blood clots and no visible areas of obvious bleeding. No lesions  Cardiovascular: Normal rate and regular rhythm.   Pulmonary/Chest: Effort normal and breath sounds normal. No respiratory distress. He has no wheezes.  Skin:  No active bruising          Assessment & Plan:  Recurrent epistaxis right naris. No bleeding now for over 24 hours. Continue to hold aspirin for 1 week. Saline nasal spray to keep moist. We did not see any specific areas to cauterize today. Follow-up for any recurrence. He will leave off CPAP nasal pillow for the next week

## 2014-10-21 NOTE — Patient Instructions (Signed)
Nosebleed Nosebleeds can be caused by many conditions, including trauma, infections, polyps, foreign bodies, dry mucous membranes or climate, medicines, and air conditioning. Most nosebleeds occur in the front of the nose. Because of this location, most nosebleeds can be controlled by pinching the nostrils gently and continuously for at least 10 to 20 minutes. The long, continuous pressure allows enough time for the blood to clot. If pressure is released during that 10 to 20 minute time period, the process may have to be started again. The nosebleed may stop by itself or quit with pressure, or it may need concentrated heating (cautery) or pressure from packing. HOME CARE INSTRUCTIONS   If your nose was packed, try to maintain the pack inside until your health care provider removes it. If a gauze pack was used and it starts to fall out, gently replace it or cut the end off. Do not cut if a balloon catheter was used to pack the nose. Otherwise, do not remove unless instructed.  Avoid blowing your nose for 12 hours after treatment. This could dislodge the pack or clot and start the bleeding again.  If the bleeding starts again, sit up and bend forward, gently pinching the front half of your nose continuously for 20 minutes.  If bleeding was caused by dry mucous membranes, use over-the-counter saline nasal spray or gel. This will keep the mucous membranes moist and allow them to heal. If you must use a lubricant, choose the water-soluble variety. Use it only sparingly and not within several hours of lying down.  Do not use petroleum jelly or mineral oil, as these may drip into the lungs and cause serious problems.  Maintain humidity in your home by using less air conditioning or by using a humidifier.  Do not use aspirin or medicines which make bleeding more likely. Your health care provider can give you recommendations on this.  Resume normal activities as you are able, but try to avoid straining,  lifting, or bending at the waist for several days.  If the nosebleeds become recurrent and the cause is unknown, your health care provider may suggest laboratory tests. SEEK MEDICAL CARE IF: You have a fever. SEEK IMMEDIATE MEDICAL CARE IF:   Bleeding recurs and cannot be controlled.  There is unusual bleeding from or bruising on other parts of the body.  Nosebleeds continue.  There is any worsening of the condition which originally brought you in.  You become light-headed, feel faint, become sweaty, or vomit blood. MAKE SURE YOU:   Understand these instructions.  Will watch your condition.  Will get help right away if you are not doing well or get worse. Document Released: 04/26/2005 Document Revised: 12/01/2013 Document Reviewed: 06/17/2009 [facility] Patient Information 2015 [place], [place]. This information is not intended to replace advice given to you by your health care provider. Make sure you discuss any questions you have with your health care provider.  Continue to hold aspirin for another week Use saline nose spray to keep nose moist, especially at night

## 2014-10-21 NOTE — Progress Notes (Signed)
Pre visit review using our clinic review tool, if applicable. No additional management support is needed unless otherwise documented below in the visit note. 

## 2014-10-27 DIAGNOSIS — H25032 Anterior subcapsular polar age-related cataract, left eye: Secondary | ICD-10-CM | POA: Diagnosis not present

## 2014-10-27 DIAGNOSIS — H2512 Age-related nuclear cataract, left eye: Secondary | ICD-10-CM | POA: Diagnosis not present

## 2014-10-27 DIAGNOSIS — H25812 Combined forms of age-related cataract, left eye: Secondary | ICD-10-CM | POA: Diagnosis not present

## 2014-10-27 DIAGNOSIS — H25042 Posterior subcapsular polar age-related cataract, left eye: Secondary | ICD-10-CM | POA: Diagnosis not present

## 2014-12-09 DIAGNOSIS — H3531 Nonexudative age-related macular degeneration: Secondary | ICD-10-CM | POA: Diagnosis not present

## 2015-02-09 ENCOUNTER — Encounter: Payer: Self-pay | Admitting: Genetic Counselor

## 2015-02-16 ENCOUNTER — Other Ambulatory Visit: Payer: Self-pay | Admitting: Internal Medicine

## 2015-02-16 MED ORDER — LEVOTHYROXINE SODIUM 125 MCG PO TABS: 125.0000 ug | ORAL_TABLET | ORAL | Status: DC

## 2015-05-26 ENCOUNTER — Ambulatory Visit (INDEPENDENT_AMBULATORY_CARE_PROVIDER_SITE_OTHER): Payer: Medicare Other | Admitting: *Deleted

## 2015-05-26 DIAGNOSIS — Z23 Encounter for immunization: Secondary | ICD-10-CM | POA: Diagnosis not present

## 2015-05-27 ENCOUNTER — Ambulatory Visit: Payer: Medicare Other

## 2015-06-01 DIAGNOSIS — H353132 Nonexudative age-related macular degeneration, bilateral, intermediate dry stage: Secondary | ICD-10-CM | POA: Diagnosis not present

## 2015-06-01 DIAGNOSIS — H43813 Vitreous degeneration, bilateral: Secondary | ICD-10-CM | POA: Diagnosis not present

## 2015-07-22 IMAGING — CR DG RIBS W/ CHEST 3+V*L*
3 series · 3 of 3 positions shown · non-contrast
Comparison: PA and lateral chest 10/23/2011.

CLINICAL DATA: Left-sided chest wall pain beginning this morning
after an injury. Initial encounter.

EXAM:
LEFT RIBS AND CHEST - 3+ VIEW

[PA]
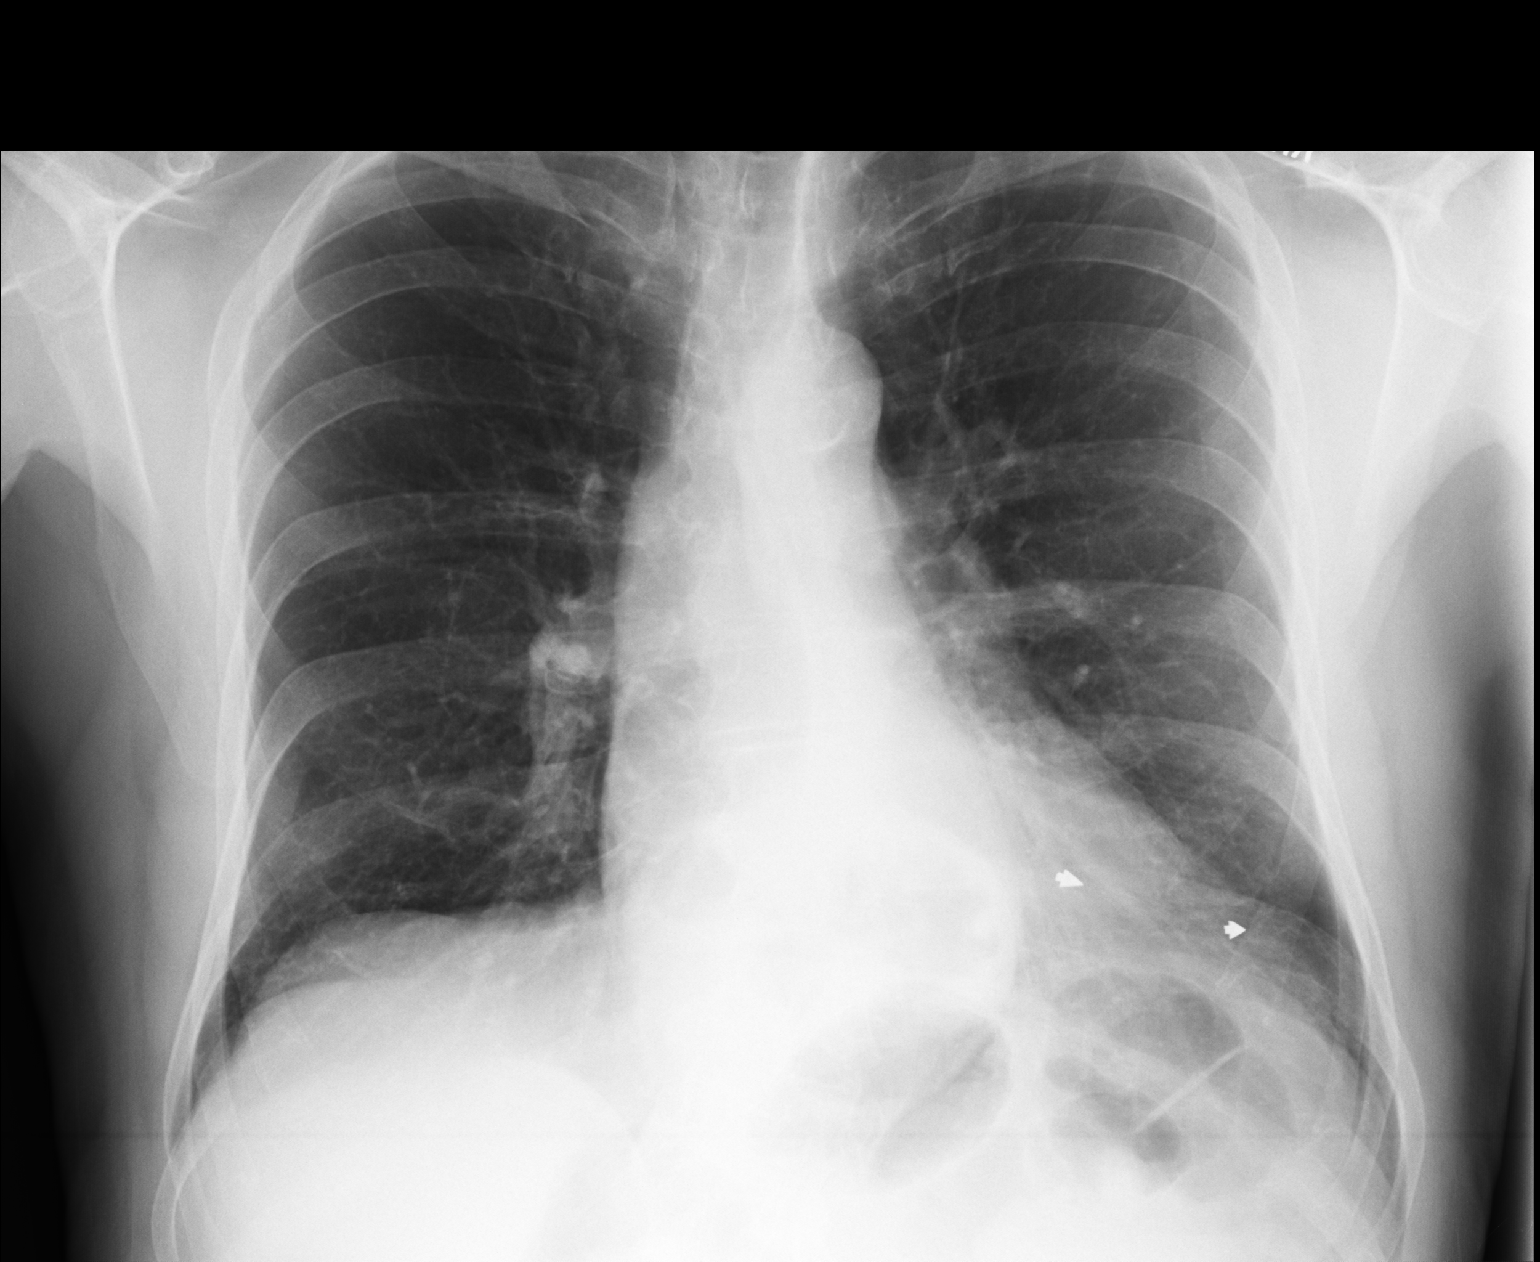

[rao]
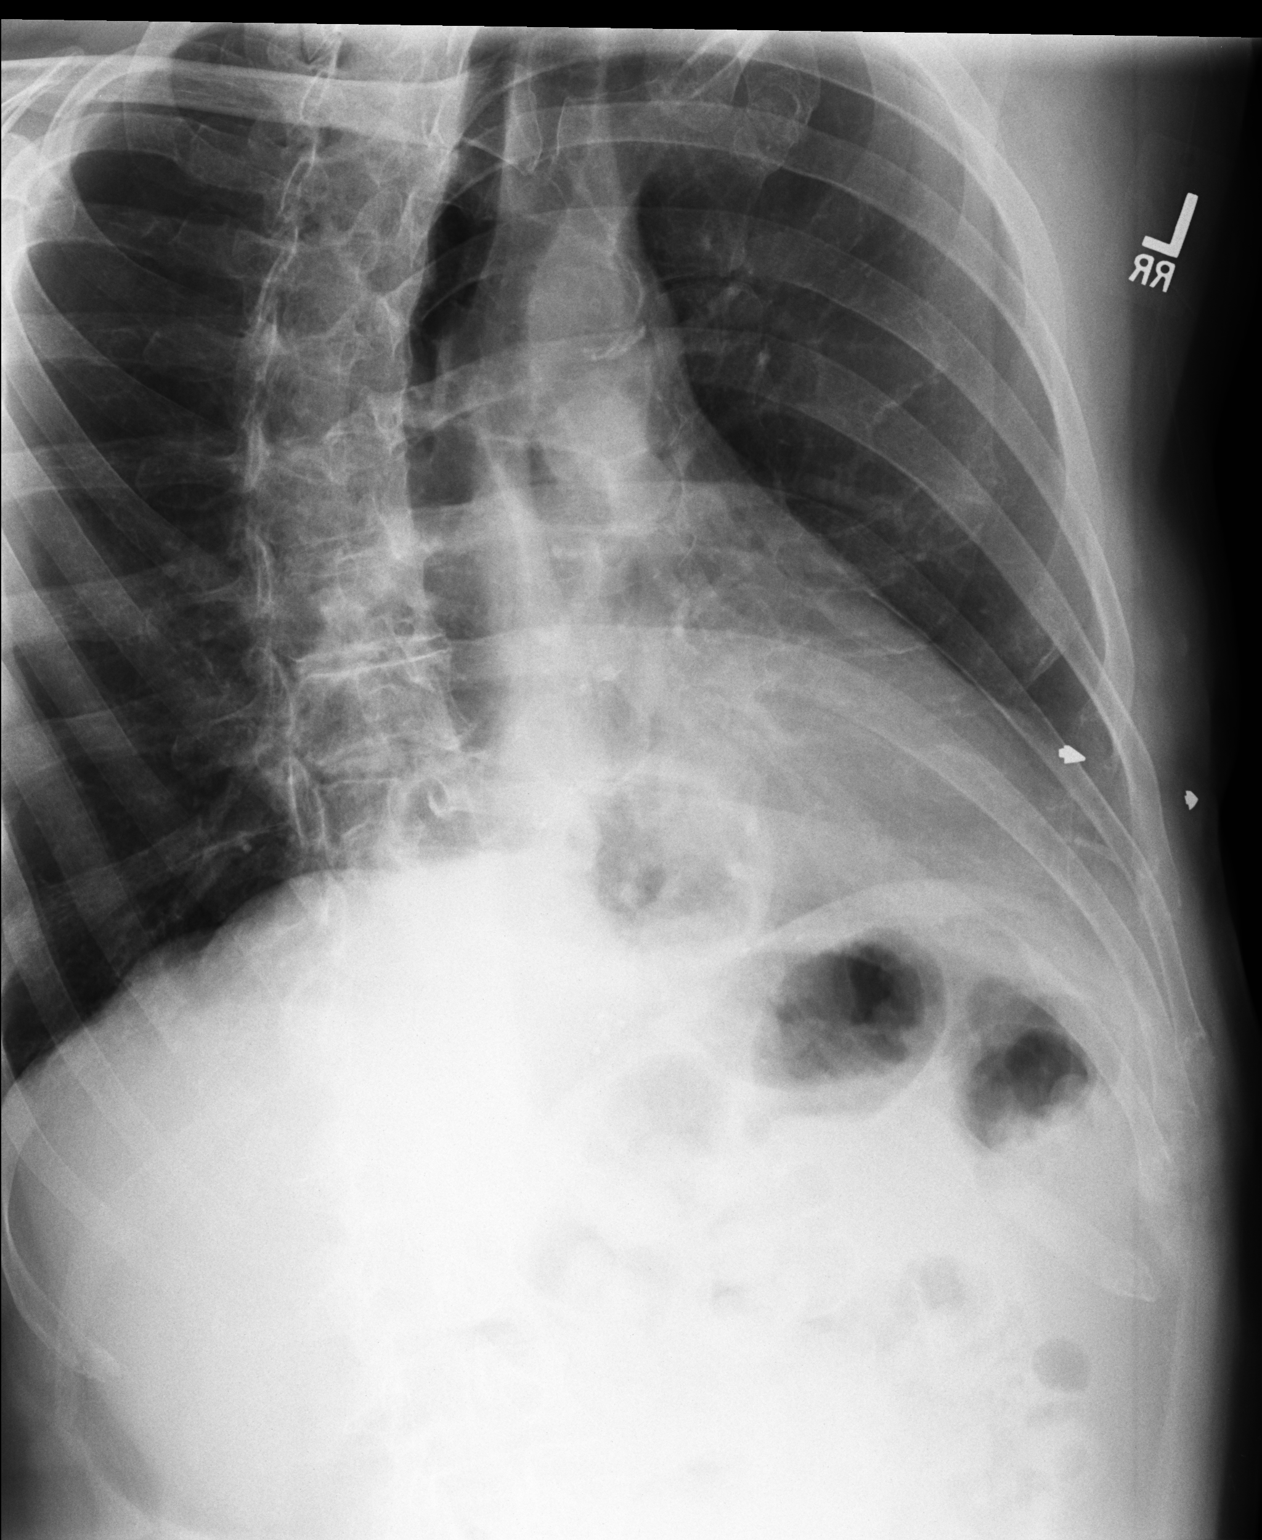

[lao]
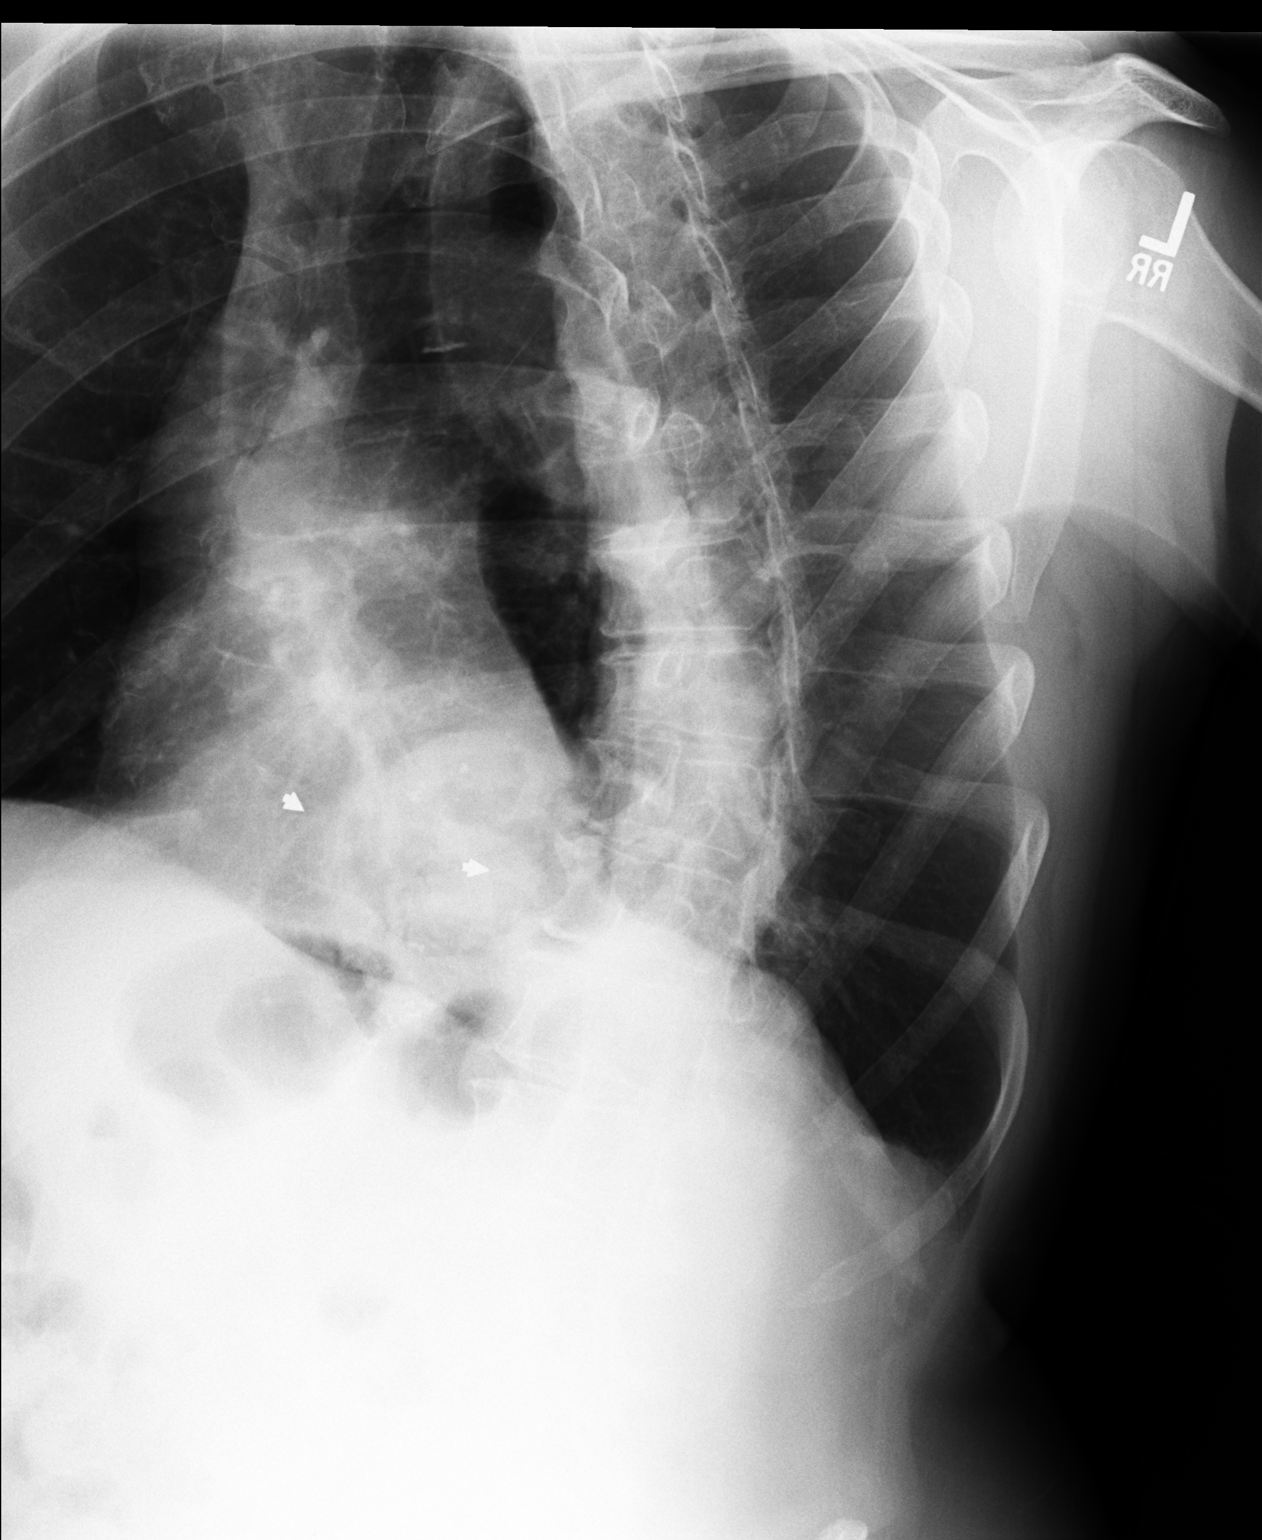

[3 of 3 positions shown; findings below may reference images not displayed]

FINDINGS: Single view of the chest demonstrates clear lungs and normal heart
size. Hiatal hernia is noted. No rib fracture is identified.
IMPRESSION: Negative for fracture.  No acute finding.

Hiatal hernia.

## 2015-07-28 ENCOUNTER — Encounter: Payer: Self-pay | Admitting: *Deleted

## 2015-08-17 ENCOUNTER — Telehealth: Payer: Self-pay | Admitting: Internal Medicine

## 2015-08-17 ENCOUNTER — Other Ambulatory Visit (INDEPENDENT_AMBULATORY_CARE_PROVIDER_SITE_OTHER): Payer: Medicare Other

## 2015-08-17 DIAGNOSIS — R972 Elevated prostate specific antigen [PSA]: Secondary | ICD-10-CM | POA: Diagnosis not present

## 2015-08-17 DIAGNOSIS — E039 Hypothyroidism, unspecified: Secondary | ICD-10-CM

## 2015-08-17 DIAGNOSIS — E785 Hyperlipidemia, unspecified: Secondary | ICD-10-CM | POA: Diagnosis not present

## 2015-08-17 DIAGNOSIS — D649 Anemia, unspecified: Secondary | ICD-10-CM | POA: Diagnosis not present

## 2015-08-17 DIAGNOSIS — Z0000 Encounter for general adult medical examination without abnormal findings: Secondary | ICD-10-CM

## 2015-08-17 DIAGNOSIS — I519 Heart disease, unspecified: Secondary | ICD-10-CM

## 2015-08-17 LAB — CBC WITH DIFFERENTIAL/PLATELET
Basophils Absolute: 0 10*3/uL (ref 0.0–0.1)
Basophils Relative: 0.9 % (ref 0.0–3.0)
Eosinophils Absolute: 0.1 10*3/uL (ref 0.0–0.7)
Eosinophils Relative: 1.6 % (ref 0.0–5.0)
HCT: 43.7 % (ref 39.0–52.0)
Hemoglobin: 14.1 g/dL (ref 13.0–17.0)
Lymphocytes Relative: 27.7 % (ref 12.0–46.0)
Lymphs Abs: 1.5 10*3/uL (ref 0.7–4.0)
MCHC: 32.3 g/dL (ref 30.0–36.0)
MCV: 86.8 fl (ref 78.0–100.0)
Monocytes Absolute: 0.7 10*3/uL (ref 0.1–1.0)
Monocytes Relative: 12.3 % — ABNORMAL HIGH (ref 3.0–12.0)
Neutro Abs: 3.1 10*3/uL (ref 1.4–7.7)
Neutrophils Relative %: 57.5 % (ref 43.0–77.0)
Platelets: 232 10*3/uL (ref 150.0–400.0)
RBC: 5.04 Mil/uL (ref 4.22–5.81)
RDW: 14.5 % (ref 11.5–15.5)
WBC: 5.3 10*3/uL (ref 4.0–10.5)

## 2015-08-17 LAB — POCT URINALYSIS DIPSTICK
Bilirubin, UA: NEGATIVE
Blood, UA: NEGATIVE
Glucose, UA: NEGATIVE
Ketones, UA: NEGATIVE
Leukocytes, UA: NEGATIVE
Nitrite, UA: NEGATIVE
Protein, UA: NEGATIVE
Spec Grav, UA: 1.015
Urobilinogen, UA: 0.2
pH, UA: 6.5

## 2015-08-17 LAB — TSH: TSH: 1.57 u[IU]/mL (ref 0.35–4.50)

## 2015-08-17 LAB — LIPID PANEL
Cholesterol: 187 mg/dL (ref 0–200)
HDL: 42.4 mg/dL (ref >39.00)
LDL Cholesterol: 118 mg/dL — ABNORMAL HIGH (ref 0–99)
NonHDL: 144.9
Total CHOL/HDL Ratio: 4
Triglycerides: 134 mg/dL (ref 0.0–149.0)
VLDL: 26.8 mg/dL (ref 0.0–40.0)

## 2015-08-17 LAB — BASIC METABOLIC PANEL
BUN: 15 mg/dL (ref 6–23)
CO2: 27 mEq/L (ref 19–32)
Calcium: 8.9 mg/dL (ref 8.4–10.5)
Chloride: 102 mEq/L (ref 96–112)
Creatinine, Ser: 0.96 mg/dL (ref 0.40–1.50)
GFR: 80.39 mL/min (ref >60.00)
Glucose, Bld: 113 mg/dL — ABNORMAL HIGH (ref 70–99)
Potassium: 4.2 mEq/L (ref 3.5–5.1)
Sodium: 137 mEq/L (ref 135–145)

## 2015-08-17 LAB — HEPATIC FUNCTION PANEL
ALT: 15 U/L (ref 0–53)
AST: 17 U/L (ref 0–37)
Albumin: 4 g/dL (ref 3.5–5.2)
Alkaline Phosphatase: 86 U/L (ref 39–117)
Bilirubin, Direct: 0.1 mg/dL (ref 0.0–0.3)
Total Bilirubin: 0.5 mg/dL (ref 0.2–1.2)
Total Protein: 7 g/dL (ref 6.0–8.3)

## 2015-08-17 LAB — PSA: PSA: 0.9 ng/mL (ref 0.10–4.00)

## 2015-08-17 MED ORDER — LEVOTHYROXINE SODIUM 125 MCG PO TABS: 125.0000 ug | ORAL_TABLET | ORAL | Status: DC

## 2015-08-17 MED ORDER — METOPROLOL SUCCINATE ER 25 MG PO TB24: 25.0000 mg | ORAL_TABLET | Freq: Every day | ORAL | Status: DC

## 2015-08-17 NOTE — Telephone Encounter (Signed)
Patient has a physical schedule for 08/24/2015.

## 2015-08-17 NOTE — Telephone Encounter (Signed)
Patient is requesting a RX refill on levothyroxine (SYNTHROID, LEVOTHROID) 125 MCG tablet and metoprolol succinate (TOPROL-XL) 25 MG 24 hr tablet  Pharmacy: [facility]

## 2015-08-17 NOTE — Telephone Encounter (Signed)
Noted  

## 2015-08-17 NOTE — Telephone Encounter (Signed)
Please call pt and schedule physical. He is overdue was due in June. Tell pt I sent Rx to [facility] as requested but only a 90 day supply till seen.

## 2015-08-24 ENCOUNTER — Ambulatory Visit (INDEPENDENT_AMBULATORY_CARE_PROVIDER_SITE_OTHER): Payer: Medicare Other | Admitting: Internal Medicine

## 2015-08-24 ENCOUNTER — Encounter: Payer: Self-pay | Admitting: Internal Medicine

## 2015-08-24 VITALS — BP 110/70 | HR 63 | Temp 97.8°F | Resp 20 | Ht 67.75 in | Wt 176.0 lb

## 2015-08-24 DIAGNOSIS — E785 Hyperlipidemia, unspecified: Secondary | ICD-10-CM | POA: Diagnosis not present

## 2015-08-24 DIAGNOSIS — E039 Hypothyroidism, unspecified: Secondary | ICD-10-CM | POA: Diagnosis not present

## 2015-08-24 DIAGNOSIS — Z86010 Personal history of colonic polyps: Secondary | ICD-10-CM

## 2015-08-24 DIAGNOSIS — I471 Supraventricular tachycardia: Secondary | ICD-10-CM

## 2015-08-24 DIAGNOSIS — K219 Gastro-esophageal reflux disease without esophagitis: Secondary | ICD-10-CM | POA: Diagnosis not present

## 2015-08-24 DIAGNOSIS — Z0000 Encounter for general adult medical examination without abnormal findings: Secondary | ICD-10-CM

## 2015-08-24 NOTE — Progress Notes (Signed)
Subjective:    Patient ID: Yengkong Bibb, male    DOB: 05/27/1935, 81 y.o.   MRN: [id number]  HPI   Patient ID: Stalin Houlton, male   DOB: 29-Oct-1934, 81 y.o.   MRN: [id number]  Subjective:    Patient ID: Lowella Fairy, male    DOB: [birth year], 81 y.o.   MRN: [id number]  HPI  81 year-old patient who is seen today for a preventive health examination.  He is approximately 3 years status post laparoscopic Nissan fundoplication for a large hiatal  hernia with refractory reflux symptoms. He had done well postoperatively but apparently the hernia has reoccurred.  He continues to have some mild swallowing difficulties but stable and manageable. He has a history of OSA and underwent a split night sleep study 8 years ago that revealed moderate OSA. He has a history of hypothyroidism. He has a history of PSVT and has been treated with beta blocker therapy.  He has rare nonsustained palpitations.  Has a prior history of syncope related to the PSVT but none recently.  Wt Readings from Last 3 Encounters:  08/24/15 176 lb (79.833 kg)  10/21/14 168 lb (76.204 kg)  09/29/14 175 lb (79.379 kg)    Past Medical History  Diagnosis Date  . Headache(784.0)   . GERD (gastroesophageal reflux disease)   . Hx of colonic polyps     Dr. Earlean Shawl removed 2 in 2009  . History of cystoscopy 1965  . History of hernia repair     left side:1991and 1978 right side: 2009  . Thyroid disease     hypothyroidism  . Hyperlipidemia   . Anemia   . Hx of benign prostatic hypertrophy   . Shortness of breath     with exertion   . Sleep apnea     2008 no CPAP machine   . Hypothyroidism   . H/O hiatal hernia   . SVT (supraventricular tachycardia) ([place])     2002     Social History   Social History  . Marital Status: Married    Spouse Name: N/A  . Number of Children: N/A  . Years of Education: N/A   Occupational History  . Not on file.   Social History Main Topics  . Smoking status: Former Research scientist (life sciences)  . Smokeless  tobacco: Never Used  . Alcohol Use: 0.6 oz/week    1 Glasses of wine per week     Comment: 2 nites per week   . Drug Use: No  . Sexual Activity: No   Other Topics Concern  . Not on file   Social History Narrative    Past Surgical History  Procedure Laterality Date  . Thyroidectomy  1967  . Bilateral hernia repair  [id number]  . Cystocopy  1965  . Pneumonia  81 years old  . Borkne bones foot  81 yrs old  . Broken finger  81 yrs old  . Skin cancer face last ck on 08-22-07    . Hernia repair  1991, 1998, 2009  . Laparoscopic nissen fundoplication  [id number]    Procedure: LAPAROSCOPIC NISSEN FUNDOPLICATION;  Surgeon: Edward Jolly, MD;  Location: [facility];  Service: General;  Laterality: N/A;  Laparoscopic Repair of Large Hiatal Hernia with Nissen Fundoplication with mesh     Family History  Problem Relation Age of Onset  . Cancer Mother 52    breast  . Heart disease Mother   . Dementia Mother 29  . Thyroid disease Mother   . Macular  degeneration Mother   . Cancer Maternal Grandmother   . Cancer Paternal Grandmother   . Heart disease Father   . Hypertension Father   . Heart failure Father   . Dementia Father     No Known Allergies  Current Outpatient Prescriptions on File Prior to Visit  Medication Sig Dispense Refill  . acetaminophen (TYLENOL) 500 MG tablet Take 1,000 mg by mouth every 4 (four) hours as needed. For pain    . aspirin 81 MG tablet Take 81 mg by mouth every morning.     . diphenhydrAMINE (BENADRYL) 25 MG tablet Take 25-50 mg by mouth at bedtime.    Marland Kitchen levothyroxine (SYNTHROID, LEVOTHROID) 125 MCG tablet Take 1 tablet (125 mcg total) by mouth every morning. 90 tablet 0  . metoprolol succinate (TOPROL-XL) 25 MG 24 hr tablet Take 1 tablet (25 mg total) by mouth daily. 90 tablet 0  . Multiple Vitamins-Minerals (ICAPS AREDS FORMULA PO) Take 1 tablet by mouth 2 (two) times daily.    Marland Kitchen omeprazole (PRILOSEC) 20 MG capsule Take 20 mg by mouth daily.    .  pseudoephedrine (SUDAFED) 30 MG tablet Take 60 mg by mouth every 4 (four) hours as needed. For sinus congestion    . vitamin B-12 (CYANOCOBALAMIN) 1000 MCG tablet Take 500 mcg by mouth daily. Patient states that he takes half tablet once a day     No current facility-administered medications on file prior to visit.    BP 110/70 mmHg  Pulse 63  Temp(Src) 97.8 F (36.6 C) (Oral)  Resp 20  Ht 5' 7.75" (1.721 m)  Wt 176 lb (79.833 kg)  BMI 26.95 kg/m2  SpO2 97%   1. Risk factors, based on past  M,S,F history- cardiovascular risk factors unremarkable except for age and male sex  2.  Physical activities: Remains active physically no exercise intolerance  3.  Depression/mood: No history depression or mood disorder  4.  Hearing: No deficits  5.  ADL's: Independent in all aspects of daily living 6.  Fall risk:  7.  Home safety: Low  8.  Height weight, and visual acuity; height and weight stable no change in visual acuity does have a history of vitamin D deficiency in the past  9.  Counseling: Active lifestyle more regular exercise all encouraged  10. Lab orders based on risk factors: Laboratory profile including TSH and lipid panel will be reviewed   11. Referral : Not appropriate at this time. Has had recent GI followup  12. Care plan: Continue a regular exercise program and heart healthy diet  13. Cognitive assessment: Alert and with normal affect. No cognitive dysfunction  14.  Preventive services will include annual clinical exams with screening lab.  Will consider a final screening colonoscopy due to his history of colonic polyps.  15.  Provider list includes primary care ophthalmology and GI       Review of Systems  Constitutional: Negative for fever, chills, appetite change and fatigue.  HENT: Negative for hearing loss, ear pain, congestion, sore throat, trouble swallowing, neck stiffness, dental problem, voice change and tinnitus.   Eyes: Negative for pain,  discharge and visual disturbance.  Respiratory: Negative for cough, chest tightness, wheezing and stridor.   Cardiovascular: Negative for chest pain, palpitations and leg swelling.  Gastrointestinal: Negative for nausea, vomiting, abdominal pain, diarrhea, constipation, blood in stool and abdominal distention.  Genitourinary: Negative for urgency, hematuria, flank pain, discharge, difficulty urinating and genital sores.  Musculoskeletal: Negative for myalgias, back pain,  joint swelling, arthralgias and gait problem.  Skin: Negative for rash.  Neurological: Negative for dizziness, syncope, speech difficulty, weakness, numbness and headaches.  Hematological: Negative for adenopathy. Does not bruise/bleed easily.  Psychiatric/Behavioral: Negative for behavioral problems and dysphoric mood. The patient is not nervous/anxious.        Objective:   Physical Exam  Constitutional: He is oriented to person, place, and time. He appears well-developed.  HENT:  Head: Normocephalic.  Right Ear: External ear normal.  Left Ear: External ear normal.       Low hanging soft palate with pharyngeal crowding  Eyes: Conjunctivae and EOM are normal.  Neck: Normal range of motion.  Cardiovascular: Normal rate and normal heart sounds.   Pulmonary/Chest: Breath sounds normal.  Abdominal: Bowel sounds are normal.       Nicely healing laparoscopic scars  Musculoskeletal: Normal range of motion. He exhibits no edema and no tenderness.  Neurological: He is alert and oriented to person, place, and time.  Psychiatric: He has a normal mood and affect. His behavior is normal.  Genitourinary. Prostate +2 enlarged         Assessment & Plan:  Preventive health examination Status post Nissen fundoplication Moderate OSA.  History of dyslipidemia. We'll check a lipid profile Hypothyroidism. We'll check a TSH  All medications refilled  Review of Systems  As above    Objective:   Physical Exam    Cardiovascular:  And posterior tibia pulses full.  Dorsalis pedis pulses are faint- left dorsalis pedis pulse nonpalpable  Genitourinary:  Slight testicular atrophy      As above     Assessment & Plan:   Preventive health examination Status post Nissen fundoplication;  S/p EGD 0000000 Moderate OSA.  History of dyslipidemia. We'll check a lipid profile Hypothyroidism. We'll check a TSH History of PSVT and syncope H/o colonic polyps; colonoscopy 2012  All medications refilled

## 2015-08-24 NOTE — Patient Instructions (Signed)
Limit your sodium (Salt) intake    It is important that you exercise regularly, at least 20 minutes 3 to 4 times per week.  If you develop chest pain or shortness of breath seek  medical attention.  Avoids foods high in acid such as tomatoes citrus juices, and spicy foods.  Avoid eating within two hours of lying down or before exercising.  Do not overheat.  Try smaller more frequent meals.  Return in one year for follow-up

## 2015-11-10 ENCOUNTER — Other Ambulatory Visit: Payer: Self-pay | Admitting: Internal Medicine

## 2015-11-10 MED ORDER — LEVOTHYROXINE SODIUM 125 MCG PO TABS: 125.0000 ug | ORAL_TABLET | ORAL | Status: DC

## 2015-11-10 MED ORDER — METOPROLOL SUCCINATE ER 25 MG PO TB24: 25.0000 mg | ORAL_TABLET | Freq: Every day | ORAL | Status: DC

## 2015-11-15 ENCOUNTER — Other Ambulatory Visit: Payer: Self-pay | Admitting: Internal Medicine

## 2015-11-15 MED ORDER — LEVOTHYROXINE SODIUM 125 MCG PO TABS: 125.0000 ug | ORAL_TABLET | ORAL | Status: DC

## 2015-11-15 MED ORDER — METOPROLOL SUCCINATE ER 25 MG PO TB24: 25.0000 mg | ORAL_TABLET | Freq: Every day | ORAL | Status: DC

## 2015-12-13 DIAGNOSIS — H43813 Vitreous degeneration, bilateral: Secondary | ICD-10-CM | POA: Diagnosis not present

## 2015-12-13 DIAGNOSIS — H5212 Myopia, left eye: Secondary | ICD-10-CM | POA: Diagnosis not present

## 2015-12-13 DIAGNOSIS — H04123 Dry eye syndrome of bilateral lacrimal glands: Secondary | ICD-10-CM | POA: Diagnosis not present

## 2015-12-13 DIAGNOSIS — H353132 Nonexudative age-related macular degeneration, bilateral, intermediate dry stage: Secondary | ICD-10-CM | POA: Diagnosis not present

## 2016-04-04 DIAGNOSIS — D1801 Hemangioma of skin and subcutaneous tissue: Secondary | ICD-10-CM | POA: Diagnosis not present

## 2016-04-04 DIAGNOSIS — L570 Actinic keratosis: Secondary | ICD-10-CM | POA: Diagnosis not present

## 2016-04-04 DIAGNOSIS — D235 Other benign neoplasm of skin of trunk: Secondary | ICD-10-CM | POA: Diagnosis not present

## 2016-04-04 DIAGNOSIS — L814 Other melanin hyperpigmentation: Secondary | ICD-10-CM | POA: Diagnosis not present

## 2016-04-04 DIAGNOSIS — L821 Other seborrheic keratosis: Secondary | ICD-10-CM | POA: Diagnosis not present

## 2016-04-24 ENCOUNTER — Encounter: Payer: Self-pay | Admitting: Internal Medicine

## 2016-04-24 ENCOUNTER — Ambulatory Visit (INDEPENDENT_AMBULATORY_CARE_PROVIDER_SITE_OTHER): Payer: Medicare Other | Admitting: Internal Medicine

## 2016-04-24 VITALS — BP 120/80 | HR 66 | Temp 98.4°F | Resp 20 | Ht 67.75 in | Wt 168.5 lb

## 2016-04-24 DIAGNOSIS — K4090 Unilateral inguinal hernia, without obstruction or gangrene, not specified as recurrent: Secondary | ICD-10-CM | POA: Diagnosis not present

## 2016-04-24 DIAGNOSIS — E039 Hypothyroidism, unspecified: Secondary | ICD-10-CM | POA: Diagnosis not present

## 2016-04-24 NOTE — Patient Instructions (Signed)
Return for your annual clinical exam as scheduled  General surgical consultation as discussed  Report any worsening pain or change in your status

## 2016-04-24 NOTE — Progress Notes (Signed)
Subjective:    Patient ID: [name], male    DOB: Feb 01, 1935, 81 y.o.   MRN: [id number]  HPI  81 year old patient who has had 3 prior surgical procedures for inguinal hernias.  He presents with a 2 to three-day history of a bulge in the left groin area associated with discomfort.  Pain is aggravated by activities and alleviated by rest. He has hypertension and hypothyroidism which has been stable.  Past Medical History:  Diagnosis Date  . Anemia   . GERD (gastroesophageal reflux disease)   . H/O hiatal hernia   . Headache(784.0)   . History of cystoscopy 1965  . History of hernia repair    left side:1991and 1978 right side: 2009  . Hx of benign prostatic hypertrophy   . Hx of colonic polyps    Dr. Earlean Shawl removed 2 in 2009  . Hyperlipidemia   . Hypothyroidism   . Shortness of breath    with exertion   . Sleep apnea    2008 no CPAP machine   . SVT (supraventricular tachycardia) ([place])    2002   . Thyroid disease    hypothyroidism     Social History   Social History  . Marital status: Married    Spouse name: N/A  . Number of children: N/A  . Years of education: N/A   Occupational History  . Not on file.   Social History Main Topics  . Smoking status: Former Research scientist (life sciences)  . Smokeless tobacco: Never Used  . Alcohol use 0.6 oz/week    1 Glasses of wine per week     Comment: 2 nites per week   . Drug use: No  . Sexual activity: No   Other Topics Concern  . Not on file   Social History Narrative  . No narrative on file    Past Surgical History:  Procedure Laterality Date  . bilateral hernia repair  [id number]  . borkne bones foot  81 yrs old  . broken finger  81 yrs old  . cystocopy  1965  . [place], 2009  . LAPAROSCOPIC NISSEN FUNDOPLICATION  [id number]   Procedure: LAPAROSCOPIC NISSEN FUNDOPLICATION;  Surgeon: Edward Jolly, MD;  Location: [facility];  Service: General;  Laterality: N/A;  Laparoscopic Repair of Large Hiatal Hernia with  Nissen Fundoplication with mesh   . pneumonia  81 years old  . skin cancer face last ck on 08-22-07    . THYROIDECTOMY  1967    Family History  Problem Relation Age of Onset  . Cancer Mother 65    breast  . Heart disease Mother   . Dementia Mother 52  . Thyroid disease Mother   . Macular degeneration Mother   . Cancer Maternal Grandmother   . Cancer Paternal Grandmother   . Heart disease Father   . Hypertension Father   . Heart failure Father   . Dementia Father     No Known Allergies  Current Outpatient Prescriptions on File Prior to Visit  Medication Sig Dispense Refill  . acetaminophen (TYLENOL) 500 MG tablet Take 1,000 mg by mouth every 4 (four) hours as needed. For pain    . aspirin 81 MG tablet Take 81 mg by mouth every morning.     . diphenhydrAMINE (BENADRYL) 25 MG tablet Take 25-50 mg by mouth at bedtime.    Marland Kitchen levothyroxine (SYNTHROID, LEVOTHROID) 125 MCG tablet Take 1 tablet (125 mcg total) by mouth every morning. 90 tablet 3  .  metoprolol succinate (TOPROL-XL) 25 MG 24 hr tablet Take 1 tablet (25 mg total) by mouth daily. 90 tablet 3  . Multiple Vitamins-Minerals (ICAPS AREDS FORMULA PO) Take 1 tablet by mouth 2 (two) times daily.    Marland Kitchen omeprazole (PRILOSEC) 20 MG capsule Take 20 mg by mouth daily.    . vitamin B-12 (CYANOCOBALAMIN) 1000 MCG tablet Take 500 mcg by mouth daily. Patient states that he takes half tablet once a day     No current facility-administered medications on file prior to visit.     BP 120/80 (BP Location: Left Arm, Patient Position: Sitting, Cuff Size: Normal)   Pulse 66   Temp 98.4 F (36.9 C) (Oral)   Resp 20   Ht 5' 7.75" (1.721 m)   Wt 168 lb 8 oz (76.4 kg)   SpO2 98%   BMI 25.81 kg/m     Review of Systems  Constitutional: Negative for appetite change, chills, fatigue and fever.  HENT: Negative for congestion, dental problem, ear pain, hearing loss, sore throat, tinnitus, trouble swallowing and voice change.   Eyes: Negative for  pain, discharge and visual disturbance.  Respiratory: Negative for cough, chest tightness, wheezing and stridor.   Cardiovascular: Negative for chest pain, palpitations and leg swelling.  Gastrointestinal: Positive for abdominal pain. Negative for abdominal distention, blood in stool, constipation, diarrhea, nausea and vomiting.  Genitourinary: Negative for difficulty urinating, discharge, flank pain, genital sores, hematuria and urgency.  Musculoskeletal: Negative for arthralgias, back pain, gait problem, joint swelling, myalgias and neck stiffness.  Skin: Negative for rash.  Neurological: Negative for dizziness, syncope, speech difficulty, weakness, numbness and headaches.  Hematological: Negative for adenopathy. Does not bruise/bleed easily.  Psychiatric/Behavioral: Negative for behavioral problems and dysphoric mood. The patient is not nervous/anxious.        Objective:   Physical Exam  Constitutional: He appears well-developed and well-nourished. No distress.  Abdominal:  Nonreducible slightly tender left inguinal hernia          Assessment & Plan:   Symptomatic left inguinal hernia.  Will set up for general surgical consultation  Annual exam as scheduled  Nyoka Cowden

## 2016-04-24 NOTE — Progress Notes (Signed)
Pre visit review using our clinic review tool, if applicable. No additional management support is needed unless otherwise documented below in the visit note. 

## 2016-05-19 ENCOUNTER — Other Ambulatory Visit: Payer: Self-pay | Admitting: General Surgery

## 2016-05-19 DIAGNOSIS — R1909 Other intra-abdominal and pelvic swelling, mass and lump: Secondary | ICD-10-CM | POA: Diagnosis not present

## 2016-05-24 ENCOUNTER — Ambulatory Visit
Admission: RE | Admit: 2016-05-24 | Discharge: 2016-05-24 | Disposition: A | Discharge status: Home / Self Care | Payer: Medicare Other | Source: Ambulatory Visit | Attending: General Surgery | Admitting: General Surgery

## 2016-05-24 DIAGNOSIS — K4090 Unilateral inguinal hernia, without obstruction or gangrene, not specified as recurrent: Secondary | ICD-10-CM | POA: Diagnosis not present

## 2016-05-24 DIAGNOSIS — R1909 Other intra-abdominal and pelvic swelling, mass and lump: Secondary | ICD-10-CM

## 2016-05-24 MED ORDER — IOPAMIDOL (ISOVUE-300) INJECTION 61%
100.0000 mL | Freq: Once | INTRAVENOUS | Status: AC | PRN
  Administered 2016-05-24: 100 mL via INTRAVENOUS

## 2016-05-26 ENCOUNTER — Ambulatory Visit (INDEPENDENT_AMBULATORY_CARE_PROVIDER_SITE_OTHER): Payer: Medicare Other

## 2016-05-26 ENCOUNTER — Ambulatory Visit: Payer: Medicare Other

## 2016-05-26 DIAGNOSIS — Z23 Encounter for immunization: Secondary | ICD-10-CM | POA: Diagnosis not present

## 2016-06-01 DIAGNOSIS — K4091 Unilateral inguinal hernia, without obstruction or gangrene, recurrent: Secondary | ICD-10-CM | POA: Diagnosis not present

## 2016-06-14 DIAGNOSIS — H353112 Nonexudative age-related macular degeneration, right eye, intermediate dry stage: Secondary | ICD-10-CM | POA: Diagnosis not present

## 2016-06-14 DIAGNOSIS — H353122 Nonexudative age-related macular degeneration, left eye, intermediate dry stage: Secondary | ICD-10-CM | POA: Diagnosis not present

## 2016-08-04 ENCOUNTER — Other Ambulatory Visit: Payer: Self-pay | Admitting: General Surgery

## 2016-08-04 DIAGNOSIS — K4091 Unilateral inguinal hernia, without obstruction or gangrene, recurrent: Secondary | ICD-10-CM | POA: Diagnosis not present

## 2016-08-04 DIAGNOSIS — K4131 Unilateral femoral hernia, with obstruction, without gangrene, recurrent: Secondary | ICD-10-CM | POA: Diagnosis not present

## 2016-08-04 DIAGNOSIS — K4130 Unilateral femoral hernia, with obstruction, without gangrene, not specified as recurrent: Secondary | ICD-10-CM | POA: Diagnosis not present

## 2016-08-18 ENCOUNTER — Other Ambulatory Visit (INDEPENDENT_AMBULATORY_CARE_PROVIDER_SITE_OTHER): Payer: Medicare Other

## 2016-08-18 DIAGNOSIS — I10 Essential (primary) hypertension: Secondary | ICD-10-CM

## 2016-08-18 DIAGNOSIS — E039 Hypothyroidism, unspecified: Secondary | ICD-10-CM | POA: Diagnosis not present

## 2016-08-18 DIAGNOSIS — D649 Anemia, unspecified: Secondary | ICD-10-CM

## 2016-08-18 DIAGNOSIS — E785 Hyperlipidemia, unspecified: Secondary | ICD-10-CM | POA: Diagnosis not present

## 2016-08-18 LAB — POC URINALSYSI DIPSTICK (AUTOMATED)
Bilirubin, UA: NEGATIVE
Blood, UA: NEGATIVE
Glucose, UA: NEGATIVE
Ketones, UA: NEGATIVE
Leukocytes, UA: NEGATIVE
Nitrite, UA: NEGATIVE
Protein, UA: NEGATIVE
Spec Grav, UA: 1.02
Urobilinogen, UA: 0.2
pH, UA: 7

## 2016-08-18 LAB — CBC WITH DIFFERENTIAL/PLATELET
Basophils Absolute: 0 10*3/uL (ref 0.0–0.1)
Basophils Relative: 0.8 % (ref 0.0–3.0)
Eosinophils Absolute: 0.2 10*3/uL (ref 0.0–0.7)
Eosinophils Relative: 3.9 % (ref 0.0–5.0)
HCT: 43.1 % (ref 39.0–52.0)
Hemoglobin: 14.3 g/dL (ref 13.0–17.0)
Lymphocytes Relative: 28.1 % (ref 12.0–46.0)
Lymphs Abs: 1.6 10*3/uL (ref 0.7–4.0)
MCHC: 33.2 g/dL (ref 30.0–36.0)
MCV: 84.4 fl (ref 78.0–100.0)
Monocytes Absolute: 0.5 10*3/uL (ref 0.1–1.0)
Monocytes Relative: 8.6 % (ref 3.0–12.0)
Neutro Abs: 3.4 10*3/uL (ref 1.4–7.7)
Neutrophils Relative %: 58.6 % (ref 43.0–77.0)
Platelets: 342 10*3/uL (ref 150.0–400.0)
RBC: 5.11 Mil/uL (ref 4.22–5.81)
RDW: 13.9 % (ref 11.5–15.5)
WBC: 5.8 10*3/uL (ref 4.0–10.5)

## 2016-08-18 LAB — BASIC METABOLIC PANEL
BUN: 14 mg/dL (ref 6–23)
CO2: 30 mEq/L (ref 19–32)
Calcium: 9.5 mg/dL (ref 8.4–10.5)
Chloride: 100 mEq/L (ref 96–112)
Creatinine, Ser: 1.01 mg/dL (ref 0.40–1.50)
GFR: 75.62 mL/min (ref >60.00)
Glucose, Bld: 101 mg/dL — ABNORMAL HIGH (ref 70–99)
Potassium: 4.4 mEq/L (ref 3.5–5.1)
Sodium: 137 mEq/L (ref 135–145)

## 2016-08-18 LAB — HEPATIC FUNCTION PANEL
ALT: 22 U/L (ref 0–53)
AST: 15 U/L (ref 0–37)
Albumin: 4 g/dL (ref 3.5–5.2)
Alkaline Phosphatase: 107 U/L (ref 39–117)
Bilirubin, Direct: 0.1 mg/dL (ref 0.0–0.3)
Total Bilirubin: 0.5 mg/dL (ref 0.2–1.2)
Total Protein: 7.4 g/dL (ref 6.0–8.3)

## 2016-08-18 LAB — TSH: TSH: 0.44 u[IU]/mL (ref 0.35–4.50)

## 2016-08-18 LAB — LIPID PANEL
Cholesterol: 184 mg/dL (ref 0–200)
HDL: 43.2 mg/dL (ref >39.00)
LDL Cholesterol: 121 mg/dL — ABNORMAL HIGH (ref 0–99)
NonHDL: 141.23
Total CHOL/HDL Ratio: 4
Triglycerides: 103 mg/dL (ref 0.0–149.0)
VLDL: 20.6 mg/dL (ref 0.0–40.0)

## 2016-08-25 ENCOUNTER — Ambulatory Visit (INDEPENDENT_AMBULATORY_CARE_PROVIDER_SITE_OTHER): Payer: Medicare Other | Admitting: Internal Medicine

## 2016-08-25 ENCOUNTER — Encounter: Payer: Self-pay | Admitting: Internal Medicine

## 2016-08-25 VITALS — BP 130/70 | HR 79 | Temp 97.8°F | Ht 69.0 in | Wt 170.0 lb

## 2016-08-25 DIAGNOSIS — E039 Hypothyroidism, unspecified: Secondary | ICD-10-CM | POA: Diagnosis not present

## 2016-08-25 DIAGNOSIS — Z86010 Personal history of colonic polyps: Secondary | ICD-10-CM

## 2016-08-25 DIAGNOSIS — D512 Transcobalamin II deficiency: Secondary | ICD-10-CM

## 2016-08-25 DIAGNOSIS — Z8679 Personal history of other diseases of the circulatory system: Secondary | ICD-10-CM

## 2016-08-25 DIAGNOSIS — E559 Vitamin D deficiency, unspecified: Secondary | ICD-10-CM

## 2016-08-25 DIAGNOSIS — R195 Other fecal abnormalities: Secondary | ICD-10-CM

## 2016-08-25 NOTE — Patient Instructions (Signed)
Limit your sodium (Salt) intake  Please check your blood pressure on a regular basis.  If it is consistently greater than 150/90, please make an office appointment.  GI follow-up as discussed    It is important that you exercise regularly, at least 20 minutes 3 to 4 times per week.  If you develop chest pain or shortness of breath seek  medical attention.  Return in 6 months for follow-up

## 2016-08-25 NOTE — Progress Notes (Signed)
Pre visit review using our clinic review tool, if applicable. No additional management support is needed unless otherwise documented below in the visit note. 

## 2016-08-25 NOTE — Progress Notes (Signed)
Subjective:    Patient ID: [name], male    DOB: 05/30/1935, 82 y.o.   MRN: [id number]  HPI 82 year old patient who is seen today for follow-up and for a Medicare wellness visit. He has had surgery recently for a recurrent left inguinal hernia.  He has a history of hypothyroidism, dyslipidemia and essential hypertension.  He has a history of colonic polyps.  His last colonoscopy was about 5 years ago.  He also has a history of GERD and a hiatal hernia. Doing quite well today without concerns or complaints  Past Medical History:  Diagnosis Date  . Anemia   . GERD (gastroesophageal reflux disease)   . H/O hiatal hernia   . Headache(784.0)   . History of cystoscopy 1965  . History of hernia repair    left side:1991and 1978 right side: 2009  . Hx of benign prostatic hypertrophy   . Hx of colonic polyps    Dr. Earlean Shawl removed 2 in 2009  . Hyperlipidemia   . Hypothyroidism   . Shortness of breath    with exertion   . Sleep apnea    2008 no CPAP machine   . SVT (supraventricular tachycardia) ([place])    2002   . Thyroid disease    hypothyroidism     Social History   Social History  . Marital status: Married    Spouse name: N/A  . Number of children: N/A  . Years of education: N/A   Occupational History  . Not on file.   Social History Main Topics  . Smoking status: Former Research scientist (life sciences)  . Smokeless tobacco: Never Used  . Alcohol use 0.6 oz/week    1 Glasses of wine per week     Comment: 2 nites per week   . Drug use: No  . Sexual activity: No   Other Topics Concern  . Not on file   Social History Narrative  . No narrative on file    Past Surgical History:  Procedure Laterality Date  . bilateral hernia repair  [id number]  . borkne bones foot  82 yrs old  . broken finger  82 yrs old  . cystocopy  1965  . [place], 2009  . LAPAROSCOPIC NISSEN FUNDOPLICATION  [id number]   Procedure: LAPAROSCOPIC NISSEN FUNDOPLICATION;  Surgeon: Edward Jolly, MD;   Location: [facility];  Service: General;  Laterality: N/A;  Laparoscopic Repair of Large Hiatal Hernia with Nissen Fundoplication with mesh   . pneumonia  82 years old  . skin cancer face last ck on 08-22-07    . THYROIDECTOMY  1967    Family History  Problem Relation Age of Onset  . Cancer Mother 33    breast  . Heart disease Mother   . Dementia Mother 72  . Thyroid disease Mother   . Macular degeneration Mother   . Cancer Maternal Grandmother   . Cancer Paternal Grandmother   . Heart disease Father   . Hypertension Father   . Heart failure Father   . Dementia Father     No Known Allergies  Current Outpatient Prescriptions on File Prior to Visit  Medication Sig Dispense Refill  . acetaminophen (TYLENOL) 500 MG tablet Take 1,000 mg by mouth every 4 (four) hours as needed. For pain    . aspirin 81 MG tablet Take 81 mg by mouth every morning.     . diphenhydrAMINE (BENADRYL) 25 MG tablet Take 25-50 mg by mouth at bedtime.    Marland Kitchen  levothyroxine (SYNTHROID, LEVOTHROID) 125 MCG tablet Take 1 tablet (125 mcg total) by mouth every morning. 90 tablet 3  . metoprolol succinate (TOPROL-XL) 25 MG 24 hr tablet Take 1 tablet (25 mg total) by mouth daily. 90 tablet 3  . Multiple Vitamins-Minerals (ICAPS AREDS FORMULA PO) Take 1 tablet by mouth 2 (two) times daily.    Marland Kitchen omeprazole (PRILOSEC) 20 MG capsule Take 20 mg by mouth daily.    . vitamin B-12 (CYANOCOBALAMIN) 1000 MCG tablet Take 500 mcg by mouth daily. Patient states that he takes half tablet once a day     No current facility-administered medications on file prior to visit.     BP 130/70 (BP Location: Left Arm, Patient Position: Sitting, Cuff Size: Normal)   Pulse 79   Temp 97.8 F (36.6 C) (Oral)   Ht 5\' 9"  (1.753 m)   Wt 170 lb (77.1 kg)   SpO2 91%   BMI 25.10 kg/m   Medicare wellness visit  1. Risk factors, based on past  M,S,F history.  Cardiovascular risk factors include essential hypertension and dyslipidemia  2.   Physical activities:remains quite active, although recovering from left inguinal hernia repair does participate in activities at his health club  3.  Depression/mood:no history of major depression or mood disorder  4.  Hearing:no deficits  5.  ADL's:independent  6.  Fall risk:low  7.  Home safety:no problems identified  8.  Height weight, and visual acuity;height and weight stable no change in visual acuity is followed by ophthalmology at least annually for early macular degeneration  9.  Counseling:continue heart healthy diet  10. Lab orders based on risk factors:laboratory studies reviewed  11. Referral :GI referral for follow-up colonoscopy due to hematemesis.  Positive stool and history of colonic polyps  12. Care plan:continue efforts at aggressive risk factor modification  13. Cognitive assessment: alert and oriented with normal affect no cognitive dysfunction  14. Screening: Patient provided with a written and personalized 5-10 year screening schedule in the AVS.    15. Provider List Update: primary care ophthalmology and GI     Review of Systems  Constitutional: Negative for appetite change, chills, fatigue and fever.  HENT: Negative for congestion, dental problem, ear pain, hearing loss, sore throat, tinnitus, trouble swallowing and voice change.   Eyes: Negative for pain, discharge and visual disturbance.  Respiratory: Negative for cough, chest tightness, wheezing and stridor.   Cardiovascular: Negative for chest pain, palpitations and leg swelling.  Gastrointestinal: Negative for abdominal distention, abdominal pain, blood in stool, constipation, diarrhea, nausea and vomiting.  Genitourinary: Negative for difficulty urinating, discharge, flank pain, genital sores, hematuria and urgency.  Musculoskeletal: Negative for arthralgias, back pain, gait problem, joint swelling, myalgias and neck stiffness.  Skin: Negative for rash.  Neurological: Negative for dizziness,  syncope, speech difficulty, weakness, numbness and headaches.  Hematological: Negative for adenopathy. Does not bruise/bleed easily.  Psychiatric/Behavioral: Negative for behavioral problems and dysphoric mood. The patient is not nervous/anxious.        Objective:   Physical Exam  Constitutional: He appears well-developed and well-nourished.  HENT:  Head: Normocephalic and atraumatic.  Right Ear: External ear normal.  Left Ear: External ear normal.  Nose: Nose normal.  Mouth/Throat: Oropharynx is clear and moist.  Eyes: Conjunctivae and EOM are normal. Pupils are equal, round, and reactive to light. No scleral icterus.  Neck: Normal range of motion. Neck supple. No JVD present. No thyromegaly present.  Cardiovascular: Regular rhythm, normal heart sounds and intact  distal pulses.  Exam reveals no gallop and no friction rub.   No murmur heard. Dorsalis pedis pulses faint.  Posterior tibial pulses full  Pulmonary/Chest: Effort normal and breath sounds normal. He exhibits no tenderness.  Abdominal: Soft. Bowel sounds are normal. He exhibits no distension and no mass. There is no tenderness.  Healing surgical scar left inguinal area  Genitourinary: Penis normal. Rectal exam shows guaiac positive stool.  Genitourinary Comments: Prostate minimally enlarged, symmetrical Stool hematest positive  Musculoskeletal: Normal range of motion. He exhibits no edema or tenderness.  Lymphadenopathy:    He has no cervical adenopathy.  Neurological: He is alert. He has normal reflexes. No cranial nerve deficit. Coordination normal.  Skin: Skin is warm and dry. No rash noted.  Psychiatric: He has a normal mood and affect. His behavior is normal.          Assessment & Plan:   Medicare wellness visit- Subsequent essential hypertension, well-controlled.  Repeat blood pressure 110/64 Hypothyroidism.  Continue supplement History colonic polyps/hematest positive stool.  Will follow-up with GI.   Consider diagnostic colonoscopy;  history of GERD and hiatal hernia  Follow-up here 6 months  KWIATKOWSKI,PETER Pilar Plate

## 2016-09-29 ENCOUNTER — Other Ambulatory Visit: Payer: Self-pay | Admitting: Internal Medicine

## 2016-09-29 MED ORDER — METOPROLOL SUCCINATE ER 25 MG PO TB24: 25.0000 mg | ORAL_TABLET | Freq: Every day | ORAL | 3 refills | Status: DC

## 2016-09-29 MED ORDER — LEVOTHYROXINE SODIUM 125 MCG PO TABS: 125.0000 ug | ORAL_TABLET | ORAL | 3 refills | Status: DC

## 2016-10-17 DIAGNOSIS — M9903 Segmental and somatic dysfunction of lumbar region: Secondary | ICD-10-CM | POA: Diagnosis not present

## 2016-10-17 DIAGNOSIS — M5136 Other intervertebral disc degeneration, lumbar region: Secondary | ICD-10-CM | POA: Diagnosis not present

## 2016-10-23 DIAGNOSIS — M9903 Segmental and somatic dysfunction of lumbar region: Secondary | ICD-10-CM | POA: Diagnosis not present

## 2016-10-23 DIAGNOSIS — M5136 Other intervertebral disc degeneration, lumbar region: Secondary | ICD-10-CM | POA: Diagnosis not present

## 2016-10-26 DIAGNOSIS — M9903 Segmental and somatic dysfunction of lumbar region: Secondary | ICD-10-CM | POA: Diagnosis not present

## 2016-10-26 DIAGNOSIS — M5136 Other intervertebral disc degeneration, lumbar region: Secondary | ICD-10-CM | POA: Diagnosis not present

## 2016-10-30 DIAGNOSIS — M9903 Segmental and somatic dysfunction of lumbar region: Secondary | ICD-10-CM | POA: Diagnosis not present

## 2016-10-30 DIAGNOSIS — M5136 Other intervertebral disc degeneration, lumbar region: Secondary | ICD-10-CM | POA: Diagnosis not present

## 2016-11-02 DIAGNOSIS — M5136 Other intervertebral disc degeneration, lumbar region: Secondary | ICD-10-CM | POA: Diagnosis not present

## 2016-11-02 DIAGNOSIS — M9903 Segmental and somatic dysfunction of lumbar region: Secondary | ICD-10-CM | POA: Diagnosis not present

## 2016-11-06 DIAGNOSIS — M9903 Segmental and somatic dysfunction of lumbar region: Secondary | ICD-10-CM | POA: Diagnosis not present

## 2016-11-06 DIAGNOSIS — M5136 Other intervertebral disc degeneration, lumbar region: Secondary | ICD-10-CM | POA: Diagnosis not present

## 2016-11-08 DIAGNOSIS — M9903 Segmental and somatic dysfunction of lumbar region: Secondary | ICD-10-CM | POA: Diagnosis not present

## 2016-11-08 DIAGNOSIS — M5136 Other intervertebral disc degeneration, lumbar region: Secondary | ICD-10-CM | POA: Diagnosis not present

## 2016-11-13 DIAGNOSIS — M9903 Segmental and somatic dysfunction of lumbar region: Secondary | ICD-10-CM | POA: Diagnosis not present

## 2016-11-13 DIAGNOSIS — M5136 Other intervertebral disc degeneration, lumbar region: Secondary | ICD-10-CM | POA: Diagnosis not present

## 2016-11-16 DIAGNOSIS — M9903 Segmental and somatic dysfunction of lumbar region: Secondary | ICD-10-CM | POA: Diagnosis not present

## 2016-11-16 DIAGNOSIS — M5136 Other intervertebral disc degeneration, lumbar region: Secondary | ICD-10-CM | POA: Diagnosis not present

## 2016-11-20 DIAGNOSIS — M9903 Segmental and somatic dysfunction of lumbar region: Secondary | ICD-10-CM | POA: Diagnosis not present

## 2016-11-20 DIAGNOSIS — M5136 Other intervertebral disc degeneration, lumbar region: Secondary | ICD-10-CM | POA: Diagnosis not present

## 2016-11-23 DIAGNOSIS — M9903 Segmental and somatic dysfunction of lumbar region: Secondary | ICD-10-CM | POA: Diagnosis not present

## 2016-11-23 DIAGNOSIS — M5136 Other intervertebral disc degeneration, lumbar region: Secondary | ICD-10-CM | POA: Diagnosis not present

## 2016-11-28 DIAGNOSIS — M9903 Segmental and somatic dysfunction of lumbar region: Secondary | ICD-10-CM | POA: Diagnosis not present

## 2016-11-28 DIAGNOSIS — M5136 Other intervertebral disc degeneration, lumbar region: Secondary | ICD-10-CM | POA: Diagnosis not present

## 2016-12-05 DIAGNOSIS — M5136 Other intervertebral disc degeneration, lumbar region: Secondary | ICD-10-CM | POA: Diagnosis not present

## 2016-12-05 DIAGNOSIS — M9903 Segmental and somatic dysfunction of lumbar region: Secondary | ICD-10-CM | POA: Diagnosis not present

## 2016-12-12 DIAGNOSIS — M9903 Segmental and somatic dysfunction of lumbar region: Secondary | ICD-10-CM | POA: Diagnosis not present

## 2016-12-12 DIAGNOSIS — M5136 Other intervertebral disc degeneration, lumbar region: Secondary | ICD-10-CM | POA: Diagnosis not present

## 2016-12-14 DIAGNOSIS — H353132 Nonexudative age-related macular degeneration, bilateral, intermediate dry stage: Secondary | ICD-10-CM | POA: Diagnosis not present

## 2016-12-14 DIAGNOSIS — H52203 Unspecified astigmatism, bilateral: Secondary | ICD-10-CM | POA: Diagnosis not present

## 2016-12-14 DIAGNOSIS — H43813 Vitreous degeneration, bilateral: Secondary | ICD-10-CM | POA: Diagnosis not present

## 2016-12-14 DIAGNOSIS — H26491 Other secondary cataract, right eye: Secondary | ICD-10-CM | POA: Diagnosis not present

## 2016-12-26 DIAGNOSIS — M9903 Segmental and somatic dysfunction of lumbar region: Secondary | ICD-10-CM | POA: Diagnosis not present

## 2016-12-26 DIAGNOSIS — M5136 Other intervertebral disc degeneration, lumbar region: Secondary | ICD-10-CM | POA: Diagnosis not present

## 2017-01-04 DIAGNOSIS — H26491 Other secondary cataract, right eye: Secondary | ICD-10-CM | POA: Diagnosis not present

## 2017-01-09 DIAGNOSIS — M5136 Other intervertebral disc degeneration, lumbar region: Secondary | ICD-10-CM | POA: Diagnosis not present

## 2017-01-09 DIAGNOSIS — M9903 Segmental and somatic dysfunction of lumbar region: Secondary | ICD-10-CM | POA: Diagnosis not present

## 2017-01-24 DIAGNOSIS — M5136 Other intervertebral disc degeneration, lumbar region: Secondary | ICD-10-CM | POA: Diagnosis not present

## 2017-01-24 DIAGNOSIS — M9903 Segmental and somatic dysfunction of lumbar region: Secondary | ICD-10-CM | POA: Diagnosis not present

## 2017-02-14 DIAGNOSIS — M9903 Segmental and somatic dysfunction of lumbar region: Secondary | ICD-10-CM | POA: Diagnosis not present

## 2017-02-14 DIAGNOSIS — M5136 Other intervertebral disc degeneration, lumbar region: Secondary | ICD-10-CM | POA: Diagnosis not present

## 2017-02-23 ENCOUNTER — Ambulatory Visit (INDEPENDENT_AMBULATORY_CARE_PROVIDER_SITE_OTHER): Payer: Medicare Other | Admitting: Internal Medicine

## 2017-02-23 ENCOUNTER — Encounter: Payer: Self-pay | Admitting: Internal Medicine

## 2017-02-23 VITALS — BP 126/66 | HR 60 | Temp 98.7°F | Ht 69.0 in | Wt 173.4 lb

## 2017-02-23 DIAGNOSIS — Z86010 Personal history of colonic polyps: Secondary | ICD-10-CM | POA: Diagnosis not present

## 2017-02-23 DIAGNOSIS — E785 Hyperlipidemia, unspecified: Secondary | ICD-10-CM

## 2017-02-23 DIAGNOSIS — E039 Hypothyroidism, unspecified: Secondary | ICD-10-CM | POA: Diagnosis not present

## 2017-02-23 DIAGNOSIS — I471 Supraventricular tachycardia: Secondary | ICD-10-CM

## 2017-02-23 LAB — CBC WITH DIFFERENTIAL/PLATELET
Basophils Absolute: 0.1 10*3/uL (ref 0.0–0.1)
Basophils Relative: 1.7 % (ref 0.0–3.0)
Eosinophils Absolute: 0.1 10*3/uL (ref 0.0–0.7)
Eosinophils Relative: 2.7 % (ref 0.0–5.0)
HCT: 41.6 % (ref 39.0–52.0)
Hemoglobin: 13.4 g/dL (ref 13.0–17.0)
Lymphocytes Relative: 29.3 % (ref 12.0–46.0)
Lymphs Abs: 1.4 10*3/uL (ref 0.7–4.0)
MCHC: 32.2 g/dL (ref 30.0–36.0)
MCV: 81.7 fl (ref 78.0–100.0)
Monocytes Absolute: 0.5 10*3/uL (ref 0.1–1.0)
Monocytes Relative: 10.7 % (ref 3.0–12.0)
Neutro Abs: 2.7 10*3/uL (ref 1.4–7.7)
Neutrophils Relative %: 55.6 % (ref 43.0–77.0)
Platelets: 253 10*3/uL (ref 150.0–400.0)
RBC: 5.09 Mil/uL (ref 4.22–5.81)
RDW: 15 % (ref 11.5–15.5)
WBC: 4.9 10*3/uL (ref 4.0–10.5)

## 2017-02-23 NOTE — Progress Notes (Signed)
Subjective:    Patient ID: [name], male    DOB: [birth year], 82 y.o.   MRN: [id number]  HPI  82 year old patient who is seen today for his six-month follow-up. He was seen 6 months ago for his annual exam.  Rectal exam revealed hematest positive stool.  There was some miscommunication and he was asked to follow-up with his gastroenterologist since it was time to be considered for follow-up colonoscopy.  He has a history colonic polyps and has been getting colonoscopies at five-year intervals. The patient thought that we would be sending in a referral .  No change in bowel habits and in general feels fairly well except for some back painAnkle cramps involving the left leg.  Past Medical History:  Diagnosis Date  . Anemia   . GERD (gastroesophageal reflux disease)   . H/O hiatal hernia   . Headache(784.0)   . History of cystoscopy 1965  . History of hernia repair    left side:1991and 1978 right side: 2009  . Hx of benign prostatic hypertrophy   . Hx of colonic polyps    Dr. Earlean Shawl removed 2 in 2009  . Hyperlipidemia   . Hypothyroidism   . Shortness of breath    with exertion   . Sleep apnea    2008 no CPAP machine   . SVT (supraventricular tachycardia) ([place])    2002   . Thyroid disease    hypothyroidism     Social History   Social History  . Marital status: Married    Spouse name: N/A  . Number of children: N/A  . Years of education: N/A   Occupational History  . Not on file.   Social History Main Topics  . Smoking status: Former Research scientist (life sciences)  . Smokeless tobacco: Never Used  . Alcohol use 0.6 oz/week    1 Glasses of wine per week     Comment: 2 nites per week   . Drug use: No  . Sexual activity: No   Other Topics Concern  . Not on file   Social History Narrative  . No narrative on file    Past Surgical History:  Procedure Laterality Date  . bilateral hernia repair  [id number]  . borkne bones foot  82 yrs old  . broken finger  82 yrs old  . cystocopy   1965  . [place], 2009  . LAPAROSCOPIC NISSEN FUNDOPLICATION  11/02/8183   Procedure: LAPAROSCOPIC NISSEN FUNDOPLICATION;  Surgeon: Edward Jolly, MD;  Location: [facility];  Service: General;  Laterality: N/A;  Laparoscopic Repair of Large Hiatal Hernia with Nissen Fundoplication with mesh   . pneumonia  82 years old  . skin cancer face last ck on 08-22-07    . THYROIDECTOMY  1967    Family History  Problem Relation Age of Onset  . Cancer Mother 78       breast  . Heart disease Mother   . Dementia Mother 55  . Thyroid disease Mother   . Macular degeneration Mother   . Cancer Maternal Grandmother   . Cancer Paternal Grandmother   . Heart disease Father   . Hypertension Father   . Heart failure Father   . Dementia Father     No Known Allergies  Current Outpatient Prescriptions on File Prior to Visit  Medication Sig Dispense Refill  . acetaminophen (TYLENOL) 500 MG tablet Take 1,000 mg by mouth every 4 (four) hours as needed. For pain    . aspirin  81 MG tablet Take 81 mg by mouth every morning.     . diphenhydrAMINE (BENADRYL) 25 MG tablet Take 25-50 mg by mouth at bedtime.    Marland Kitchen levothyroxine (SYNTHROID, LEVOTHROID) 125 MCG tablet Take 1 tablet (125 mcg total) by mouth every morning. 90 tablet 3  . metoprolol succinate (TOPROL-XL) 25 MG 24 hr tablet Take 1 tablet (25 mg total) by mouth daily. 90 tablet 3  . Multiple Vitamins-Minerals (ICAPS AREDS FORMULA PO) Take 1 tablet by mouth 2 (two) times daily.    Marland Kitchen omeprazole (PRILOSEC) 20 MG capsule Take 20 mg by mouth daily.    . vitamin B-12 (CYANOCOBALAMIN) 1000 MCG tablet Take 500 mcg by mouth daily. Patient states that he takes half tablet once a day     No current facility-administered medications on file prior to visit.     BP 126/66 (BP Location: Left Arm, Patient Position: Sitting, Cuff Size: Normal)   Pulse 60   Temp 98.7 F (37.1 C) (Oral)   Ht 5\' 9"  (1.753 m)   Wt 173 lb 6.4 oz (78.7 kg)   SpO2 98%    BMI 25.61 kg/m     Review of Systems  Constitutional: Negative for appetite change, chills, fatigue and fever.  HENT: Negative for congestion, dental problem, ear pain, hearing loss, sore throat, tinnitus, trouble swallowing and voice change.   Eyes: Negative for pain, discharge and visual disturbance.  Respiratory: Negative for cough, chest tightness, wheezing and stridor.   Cardiovascular: Negative for chest pain, palpitations and leg swelling.  Gastrointestinal: Negative for abdominal distention, abdominal pain, blood in stool, constipation, diarrhea, nausea and vomiting.  Genitourinary: Negative for difficulty urinating, discharge, flank pain, genital sores, hematuria and urgency.  Musculoskeletal: Positive for arthralgias and back pain. Negative for gait problem, joint swelling, myalgias and neck stiffness.  Skin: Negative for rash.  Neurological: Negative for dizziness, syncope, speech difficulty, weakness, numbness and headaches.  Hematological: Negative for adenopathy. Does not bruise/bleed easily.  Psychiatric/Behavioral: Negative for behavioral problems and dysphoric mood. The patient is not nervous/anxious.        Objective:   Physical Exam  Constitutional: He is oriented to person, place, and time. He appears well-developed.  HENT:  Head: Normocephalic.  Right Ear: External ear normal.  Left Ear: External ear normal.  Eyes: Conjunctivae and EOM are normal.  Neck: Normal range of motion.  Cardiovascular: Normal rate and normal heart sounds.   Pulmonary/Chest: Breath sounds normal.  Abdominal: Bowel sounds are normal.  Musculoskeletal: Normal range of motion. He exhibits no edema or tenderness.  Neurological: He is alert and oriented to person, place, and time.  Psychiatric: He has a normal mood and affect. His behavior is normal.          Assessment & Plan:   History colonic polyps History of heme positive stool.  We will check another CBC.  Refer  GI Hypothyroidism Low back pain History of PSVT.  Stable.  Continue metoprolol   GI referral Schedule CPX 6 months  KWIATKOWSKI,PETER Pilar Plate

## 2017-02-23 NOTE — Patient Instructions (Signed)
GI follow-up with Dr. Thana Farr as discussed  Limit your sodium (Salt) intake    It is important that you exercise regularly, at least 20 minutes 3 to 4 times per week.  If you develop chest pain or shortness of breath seek  medical attention.  Return in 6 months for follow-up

## 2017-03-14 DIAGNOSIS — R195 Other fecal abnormalities: Secondary | ICD-10-CM | POA: Diagnosis not present

## 2017-03-14 DIAGNOSIS — M5136 Other intervertebral disc degeneration, lumbar region: Secondary | ICD-10-CM | POA: Diagnosis not present

## 2017-03-14 DIAGNOSIS — M9903 Segmental and somatic dysfunction of lumbar region: Secondary | ICD-10-CM | POA: Diagnosis not present

## 2017-04-12 DIAGNOSIS — K5730 Diverticulosis of large intestine without perforation or abscess without bleeding: Secondary | ICD-10-CM | POA: Diagnosis not present

## 2017-04-12 DIAGNOSIS — Z86010 Personal history of colonic polyps: Secondary | ICD-10-CM | POA: Diagnosis not present

## 2017-04-12 DIAGNOSIS — K219 Gastro-esophageal reflux disease without esophagitis: Secondary | ICD-10-CM | POA: Diagnosis not present

## 2017-04-12 DIAGNOSIS — K921 Melena: Secondary | ICD-10-CM | POA: Diagnosis not present

## 2017-04-12 DIAGNOSIS — R195 Other fecal abnormalities: Secondary | ICD-10-CM | POA: Diagnosis not present

## 2017-04-12 DIAGNOSIS — K648 Other hemorrhoids: Secondary | ICD-10-CM | POA: Diagnosis not present

## 2017-04-12 DIAGNOSIS — Z1211 Encounter for screening for malignant neoplasm of colon: Secondary | ICD-10-CM | POA: Diagnosis not present

## 2017-04-16 DIAGNOSIS — L814 Other melanin hyperpigmentation: Secondary | ICD-10-CM | POA: Diagnosis not present

## 2017-04-16 DIAGNOSIS — L821 Other seborrheic keratosis: Secondary | ICD-10-CM | POA: Diagnosis not present

## 2017-04-16 DIAGNOSIS — L570 Actinic keratosis: Secondary | ICD-10-CM | POA: Diagnosis not present

## 2017-04-16 DIAGNOSIS — D225 Melanocytic nevi of trunk: Secondary | ICD-10-CM | POA: Diagnosis not present

## 2017-04-19 ENCOUNTER — Encounter: Payer: Self-pay | Admitting: Internal Medicine

## 2017-05-15 ENCOUNTER — Ambulatory Visit (INDEPENDENT_AMBULATORY_CARE_PROVIDER_SITE_OTHER): Payer: Medicare Other | Admitting: *Deleted

## 2017-05-15 DIAGNOSIS — Z23 Encounter for immunization: Secondary | ICD-10-CM

## 2017-05-23 DIAGNOSIS — K648 Other hemorrhoids: Secondary | ICD-10-CM | POA: Diagnosis not present

## 2017-06-06 DIAGNOSIS — K648 Other hemorrhoids: Secondary | ICD-10-CM | POA: Diagnosis not present

## 2017-06-19 DIAGNOSIS — H43813 Vitreous degeneration, bilateral: Secondary | ICD-10-CM | POA: Diagnosis not present

## 2017-06-19 DIAGNOSIS — H353132 Nonexudative age-related macular degeneration, bilateral, intermediate dry stage: Secondary | ICD-10-CM | POA: Diagnosis not present

## 2017-06-20 DIAGNOSIS — K648 Other hemorrhoids: Secondary | ICD-10-CM | POA: Diagnosis not present

## 2017-08-10 ENCOUNTER — Encounter: Payer: Self-pay | Admitting: Family Medicine

## 2017-08-10 ENCOUNTER — Ambulatory Visit (INDEPENDENT_AMBULATORY_CARE_PROVIDER_SITE_OTHER): Payer: Medicare Other | Admitting: Family Medicine

## 2017-08-10 VITALS — BP 102/60 | HR 66 | Temp 97.9°F | Wt 172.4 lb

## 2017-08-10 DIAGNOSIS — H6123 Impacted cerumen, bilateral: Secondary | ICD-10-CM

## 2017-08-10 DIAGNOSIS — H6122 Impacted cerumen, left ear: Secondary | ICD-10-CM

## 2017-08-10 DIAGNOSIS — H6121 Impacted cerumen, right ear: Secondary | ICD-10-CM | POA: Diagnosis not present

## 2017-08-10 NOTE — Patient Instructions (Addendum)
Earwax Buildup, Adult The ears produce a substance called earwax that helps keep bacteria out of the ear and protects the skin in the ear canal. Occasionally, earwax can build up in the ear and cause discomfort or hearing loss. What increases the risk? This condition is more likely to develop in people who:  Are male.  Are elderly.  Naturally produce more earwax.  Clean their ears often with cotton swabs.  Use earplugs often.  Use in-ear headphones often.  Wear hearing aids.  Have narrow ear canals.  Have earwax that is overly thick or sticky.  Have eczema.  Are dehydrated.  Have excess hair in the ear canal.  What are the signs or symptoms? Symptoms of this condition include:  Reduced or muffled hearing.  A feeling of fullness in the ear or feeling that the ear is plugged.  Fluid coming from the ear.  Ear pain.  Ear itch.  Ringing in the ear.  Coughing.  An obvious piece of earwax that can be seen inside the ear canal.  How is this diagnosed? This condition may be diagnosed based on:  Your symptoms.  Your medical history.  An ear exam. During the exam, your health care provider will look into your ear with an instrument called an otoscope.  You may have tests, including a hearing test. How is this treated? This condition may be treated by:  Using ear drops to soften the earwax.  Having the earwax removed by a health care provider. The health care provider may: ? Flush the ear with water. ? Use an instrument that has a loop on the end (curette). ? Use a suction device.  Surgery to remove the wax buildup. This may be done in severe cases.  Follow these instructions at home:  Take over-the-counter and prescription medicines only as told by your health care provider.  Do not put any objects, including cotton swabs, into your ear. You can clean the opening of your ear canal with a washcloth or facial tissue.  Follow instructions from your health  care provider about cleaning your ears. Do not over-clean your ears.  Drink enough fluid to keep your urine clear or pale yellow. This will help to thin the earwax.  Keep all follow-up visits as told by your health care provider. If earwax builds up in your ears often or if you use hearing aids, consider seeing your health care provider for routine, preventive ear cleanings. Ask your health care provider how often you should schedule your cleanings.  If you have hearing aids, clean them according to instructions from the manufacturer and your health care provider. Contact a health care provider if:  You have ear pain.  You develop a fever.  You have blood, pus, or other fluid coming from your ear.  You have hearing loss.  You have ringing in your ears that does not go away.  Your symptoms do not improve with treatment.  You feel like the room is spinning (vertigo). Summary  Earwax can build up in the ear and cause discomfort or hearing loss.  The most common symptoms of this condition include reduced or muffled hearing and a feeling of fullness in the ear or feeling that the ear is plugged.  This condition may be diagnosed based on your symptoms, your medical history, and an ear exam.  This condition may be treated by using ear drops to soften the earwax or by having the earwax removed by a health care provider.  Do   not put any objects, including cotton swabs, into your ear. You can clean the opening of your ear canal with a washcloth or facial tissue. This information is not intended to replace advice given to you by your health care provider. Make sure you discuss any questions you have with your health care provider. Document Released: 08/24/2004 Document Revised: 09/27/2016 Document Reviewed: 09/27/2016 Elsevier Interactive Patient Education  2018 [place], Adult Your doctor has found that you have a condition that requires you to use ear drops. Ear drops  are a medicine that is placed in the ear. This sheet gives you information about how to use this medicine. Your doctor may also give you more specific instructions. Supplies needed:  Cotton ball.  Medicine. How to put ear drops into your ear 1. Wash your hands with soap and water. 2. Make sure your ears are clean and dry. 3. Warm the medicine by holding it in your hand for a few minutes. 4. Shake the medicine to mix the ear drops. 5. Use the tube to get the medicine. You will need to squeeze the round part of the tube to do this. 6. Put the drops in your ear as told. Hold the tube above your ear. Do not let the tube touch your ear. The medicine may go in easier if you pull the flap of your ear up and back while you put the drops in. 7. To make sure your ear soaks up the medicine, do one of these things: ? Lie down for 10 minutes. The ear with the medicine should face up. ? Put a cotton ball in your ear. Do not push it deeper into your ear. Take out the cotton ball when the drops have been soaked up. 8. If you need to put drops in your other ear, repeat the same steps. Your doctor will tell you if you should put drops in both ears. Follow these instructions at home:  Use the ear drops for as long as your doctor tells you to. Do not stop even if your symptoms get better.  Keep the ear drops at room temperature.  Keep all follow-up visits as told by your doctor. This is important. Contact a doctor if:  Your condition gets worse.  Your pain gets worse.  Unusual fluid (drainage) is coming from your ear, especially if the fluid stinks.  You have trouble hearing. Get help right away if:  You feel like the room is spinning and you feel sick to your stomach. This condition is called vertigo.  The outside of your ear becomes red or swollen.  You have a very bad headache. Summary  Ear drops are a medicine that is put in the ear.  Put the drops in your ear as told by your  doctor.  Use the ear drops for as long as your doctor tells you to. Do not stop even if your symptoms get better. This information is not intended to replace advice given to you by your health care provider. Make sure you discuss any questions you have with your health care provider. Document Released: 01/04/2010 Document Revised: 07/21/2016 Document Reviewed: 07/21/2016 Elsevier Interactive Patient Education  2017 [employer].

## 2017-08-10 NOTE — Progress Notes (Signed)
Subjective:    Patient ID: [name], male    DOB: 09-07-1934, 83 y.o.   MRN: [id number]  No chief complaint on file.   HPI Patient was seen today for acute concern.  Patient endorses bilateral hearing loss times several days.  Patient states he has issues with earwax buildup so he purchased OTC earwax drops.  The drops did not seem to help.  Patient states he was previously told to let hot water from the shower run into his ears to help clear up the wax but then he has the feeling of water in his ears so he does not do this.  Patient denies using Q-tips or attempting to take the wax out.  Past Medical History:  Diagnosis Date  . Anemia   . GERD (gastroesophageal reflux disease)   . H/O hiatal hernia   . Headache(784.0)   . History of cystoscopy 1965  . History of hernia repair    left side:1991and 1978 right side: 2009  . Hx of benign prostatic hypertrophy   . Hx of colonic polyps    Dr. Earlean Shawl removed 2 in 2009  . Hyperlipidemia   . Hypothyroidism   . Shortness of breath    with exertion   . Sleep apnea    2008 no CPAP machine   . SVT (supraventricular tachycardia) ([place])    2002   . Thyroid disease    hypothyroidism    No Known Allergies  ROS General: Denies fever, chills, night sweats, changes in weight, changes in appetite HEENT: Denies headaches, ear pain, changes in vision, rhinorrhea, sore throat  +hearing loss, concern for ear wax build up. CV: Denies CP, palpitations, SOB, orthopnea Pulm: Denies SOB, cough, wheezing GI: Denies abdominal pain, nausea, vomiting, diarrhea, constipation GU: Denies dysuria, hematuria, frequency, vaginal discharge Msk: Denies muscle cramps, joint pains Neuro: Denies weakness, numbness, tingling Skin: Denies rashes, bruising Psych: Denies depression, anxiety, hallucinations     Objective:    There were no vitals taken for this visit.   Gen. Pleasant, well-nourished, in no distress, normal affect   HEENT: [place]/AT, face symmetric,  conjunctiva clear, no scleral icterus, PERRLA, EOMI, nares patent without drainage.  Bilateral canals occluded with cerumen, unable to visualize TMs.  Bilateral ear irrigation.  L TMs visualized and normal.  R TM had cerumen remaining in the canal, curette used to remove remaining piece. R TM normal.  Hearing improved. Lungs: no accessory muscle use, CTAB, no wheezes or rales Cardiovascular: RRR, no m/r/g, no peripheral edema Neuro:  A&Ox3, CN II-XII intact, normal gait   Wt Readings from Last 3 Encounters:  02/23/17 173 lb 6.4 oz (78.7 kg)  08/25/16 170 lb (77.1 kg)  04/24/16 168 lb 8 oz (76.4 kg)    Lab Results  Component Value Date   WBC 4.9 02/23/2017   HGB 13.4 02/23/2017   HCT 41.6 02/23/2017   PLT 253.0 02/23/2017   GLUCOSE 101 (H) 08/18/2016   CHOL 184 08/18/2016   TRIG 103.0 08/18/2016   HDL 43.20 08/18/2016   LDLCALC 121 (H) 08/18/2016   ALT 22 08/18/2016   AST 15 08/18/2016   NA 137 08/18/2016   K 4.4 08/18/2016   CL 100 08/18/2016   CREATININE 1.01 08/18/2016   BUN 14 08/18/2016   CO2 30 08/18/2016   TSH 0.44 08/18/2016   PSA 0.90 08/17/2015    Assessment/Plan:  Bilateral hearing loss due to cerumen impaction  -hearing improved s/p b/l ear irrigation -curette also used in R ear  for further removal of cerumen by this provider. -Discussed increasing po intake of water daily. -Discussed how to use ear drops. -Pt given handout -f/u prn   Grier Mitts, MD

## 2017-08-13 ENCOUNTER — Telehealth: Payer: Self-pay | Admitting: Family Medicine

## 2017-08-13 NOTE — Telephone Encounter (Signed)
Spoke with Dr. Volanda Napoleon and she suggest that patient follow up with PCP. Tried to get patient in this week to see his  PCP but he is booked until next Tuesday. Patient states that he does not want to wait a week and would like to know what Dr. Raliegh Ip suggest.

## 2017-08-13 NOTE — Telephone Encounter (Signed)
Copied from [place] [id number]. Topic: General - Other >> Aug 13, 2017 10:58 AM Carolyn Stare wrote:  Pt saw Dr Volanda Napoleon on Friday 08/10/17 to have ears cleaned out. Right ear it was to deep for them to get it all out. Pt would like to know if he need to see a ENT or if he need to come back and have it cleaned again   [phone]  or [phone]

## 2017-08-15 ENCOUNTER — Other Ambulatory Visit: Payer: Self-pay | Admitting: Internal Medicine

## 2017-08-15 DIAGNOSIS — J302 Other seasonal allergic rhinitis: Secondary | ICD-10-CM | POA: Diagnosis not present

## 2017-08-15 DIAGNOSIS — Z7289 Other problems related to lifestyle: Secondary | ICD-10-CM | POA: Diagnosis not present

## 2017-08-15 DIAGNOSIS — H93291 Other abnormal auditory perceptions, right ear: Secondary | ICD-10-CM | POA: Diagnosis not present

## 2017-08-15 DIAGNOSIS — H6120 Impacted cerumen, unspecified ear: Secondary | ICD-10-CM

## 2017-08-15 DIAGNOSIS — H6091 Unspecified otitis externa, right ear: Secondary | ICD-10-CM | POA: Diagnosis not present

## 2017-08-15 DIAGNOSIS — Z87891 Personal history of nicotine dependence: Secondary | ICD-10-CM | POA: Diagnosis not present

## 2017-08-15 DIAGNOSIS — H6121 Impacted cerumen, right ear: Secondary | ICD-10-CM | POA: Diagnosis not present

## 2017-08-15 NOTE — Telephone Encounter (Signed)
Sir please see below message, please advise.

## 2017-08-15 NOTE — Telephone Encounter (Signed)
Called pt and made him aware.

## 2017-08-15 NOTE — Telephone Encounter (Signed)
Please schedule ENT to take care of earwax removal.  And make patient aware

## 2017-08-27 ENCOUNTER — Ambulatory Visit: Payer: Medicare Other | Admitting: Internal Medicine

## 2017-09-12 ENCOUNTER — Encounter: Payer: Self-pay | Admitting: Internal Medicine

## 2017-09-12 ENCOUNTER — Ambulatory Visit (INDEPENDENT_AMBULATORY_CARE_PROVIDER_SITE_OTHER): Payer: Medicare Other | Admitting: Internal Medicine

## 2017-09-12 VITALS — BP 90/60 | HR 48 | Temp 97.8°F | Ht 69.0 in | Wt 170.0 lb

## 2017-09-12 DIAGNOSIS — I471 Supraventricular tachycardia: Secondary | ICD-10-CM

## 2017-09-12 DIAGNOSIS — E039 Hypothyroidism, unspecified: Secondary | ICD-10-CM | POA: Diagnosis not present

## 2017-09-12 DIAGNOSIS — E785 Hyperlipidemia, unspecified: Secondary | ICD-10-CM

## 2017-09-12 DIAGNOSIS — Z86010 Personal history of colonic polyps: Secondary | ICD-10-CM | POA: Diagnosis not present

## 2017-09-12 DIAGNOSIS — K449 Diaphragmatic hernia without obstruction or gangrene: Secondary | ICD-10-CM

## 2017-09-12 LAB — COMPREHENSIVE METABOLIC PANEL
ALT: 17 U/L (ref 0–53)
AST: 18 U/L (ref 0–37)
Albumin: 4 g/dL (ref 3.5–5.2)
Alkaline Phosphatase: 93 U/L (ref 39–117)
BUN: 15 mg/dL (ref 6–23)
CO2: 30 mEq/L (ref 19–32)
Calcium: 9.4 mg/dL (ref 8.4–10.5)
Chloride: 100 mEq/L (ref 96–112)
Creatinine, Ser: 0.96 mg/dL (ref 0.40–1.50)
GFR: 79.96 mL/min (ref >60.00)
Glucose, Bld: 110 mg/dL — ABNORMAL HIGH (ref 70–99)
Potassium: 4.5 mEq/L (ref 3.5–5.1)
Sodium: 136 mEq/L (ref 135–145)
Total Bilirubin: 0.6 mg/dL (ref 0.2–1.2)
Total Protein: 7.2 g/dL (ref 6.0–8.3)

## 2017-09-12 LAB — LIPID PANEL
Cholesterol: 180 mg/dL (ref 0–200)
HDL: 46 mg/dL (ref >39.00)
LDL Cholesterol: 108 mg/dL — ABNORMAL HIGH (ref 0–99)
NonHDL: 134.16
Total CHOL/HDL Ratio: 4
Triglycerides: 130 mg/dL (ref 0.0–149.0)
VLDL: 26 mg/dL (ref 0.0–40.0)

## 2017-09-12 LAB — CBC WITH DIFFERENTIAL/PLATELET
Basophils Absolute: 0 10*3/uL (ref 0.0–0.1)
Basophils Relative: 1.1 % (ref 0.0–3.0)
Eosinophils Absolute: 0.1 10*3/uL (ref 0.0–0.7)
Eosinophils Relative: 1.6 % (ref 0.0–5.0)
HCT: 44 % (ref 39.0–52.0)
Hemoglobin: 14.2 g/dL (ref 13.0–17.0)
Lymphocytes Relative: 26 % (ref 12.0–46.0)
Lymphs Abs: 1.1 10*3/uL (ref 0.7–4.0)
MCHC: 32.3 g/dL (ref 30.0–36.0)
MCV: 85.8 fl (ref 78.0–100.0)
Monocytes Absolute: 0.6 10*3/uL (ref 0.1–1.0)
Monocytes Relative: 13.5 % — ABNORMAL HIGH (ref 3.0–12.0)
Neutro Abs: 2.5 10*3/uL (ref 1.4–7.7)
Neutrophils Relative %: 57.8 % (ref 43.0–77.0)
Platelets: 240 10*3/uL (ref 150.0–400.0)
RBC: 5.13 Mil/uL (ref 4.22–5.81)
RDW: 15.3 % (ref 11.5–15.5)
WBC: 4.4 10*3/uL (ref 4.0–10.5)

## 2017-09-12 LAB — TSH: TSH: 0.53 u[IU]/mL (ref 0.35–4.50)

## 2017-09-12 NOTE — Progress Notes (Signed)
Subjective:    Patient ID: [name], male    DOB: 1935/02/22, 83 y.o.   MRN: [id number]  HPI 83 year old patient who is seen today for his 93-month follow-up.  He was seen by GI last fall for heme positive stool and did have full evaluation including EGD and colonoscopy.  He has a large paraesophageal hiatal hernia which persists after a failed Nissen fundoplication.  He states that he manages fairly well as long as he watches his diet. He has hypothyroidism and remains on supplementation.  He has dyslipidemia.  He remains on PPI therapy he has a history of PSVT and remains on metoprolol.  No cardiopulmonary complaints  Past Medical History:  Diagnosis Date  . Anemia   . GERD (gastroesophageal reflux disease)   . H/O hiatal hernia   . Headache(784.0)   . History of cystoscopy 1965  . History of hernia repair    left side:1991and 1978 right side: 2009  . Hx of benign prostatic hypertrophy   . Hx of colonic polyps    Dr. Earlean Shawl removed 2 in 2009  . Hyperlipidemia   . Hypothyroidism   . Shortness of breath    with exertion   . Sleep apnea    2008 no CPAP machine   . SVT (supraventricular tachycardia) ([place])    2002   . Thyroid disease    hypothyroidism     Social History   Socioeconomic History  . Marital status: Married    Spouse name: Not on file  . Number of children: Not on file  . Years of education: Not on file  . Highest education level: Not on file  Social Needs  . Financial resource strain: Not on file  . Food insecurity - worry: Not on file  . Food insecurity - inability: Not on file  . Transportation needs - medical: Not on file  . Transportation needs - non-medical: Not on file  Occupational History  . Not on file  Tobacco Use  . Smoking status: Former Research scientist (life sciences)  . Smokeless tobacco: Never Used  Substance and Sexual Activity  . Alcohol use: Yes    Alcohol/week: 0.6 oz    Types: 1 Glasses of wine per week    Comment: 2 nites per week   . Drug use: No    . Sexual activity: No  Other Topics Concern  . Not on file  Social History Narrative  . Not on file    Past Surgical History:  Procedure Laterality Date  . bilateral hernia repair  [id number]  . borkne bones foot  83 yrs old  . broken finger  83 yrs old  . cystocopy  1965  . [place], 2009  . LAPAROSCOPIC NISSEN FUNDOPLICATION  02/02/1024   Procedure: LAPAROSCOPIC NISSEN FUNDOPLICATION;  Surgeon: Edward Jolly, MD;  Location: [facility];  Service: General;  Laterality: N/A;  Laparoscopic Repair of Large Hiatal Hernia with Nissen Fundoplication with mesh   . pneumonia  83 years old  . skin cancer face last ck on 08-22-07    . THYROIDECTOMY  1967    Family History  Problem Relation Age of Onset  . Cancer Mother 7       breast  . Heart disease Mother   . Dementia Mother 56  . Thyroid disease Mother   . Macular degeneration Mother   . Cancer Maternal Grandmother   . Cancer Paternal Grandmother   . Heart disease Father   . Hypertension Father   .  Heart failure Father   . Dementia Father     No Known Allergies  Current Outpatient Medications on File Prior to Visit  Medication Sig Dispense Refill  . acetaminophen (TYLENOL) 500 MG tablet Take 1,000 mg by mouth every 4 (four) hours as needed. For pain    . aspirin 81 MG tablet Take 81 mg by mouth every morning.     . diphenhydrAMINE (BENADRYL) 25 MG tablet Take 25-50 mg by mouth at bedtime.    . fluticasone (FLONASE) 50 MCG/ACT nasal spray Place into the nose.    . levothyroxine (SYNTHROID, LEVOTHROID) 125 MCG tablet Take 1 tablet (125 mcg total) by mouth every morning. 90 tablet 3  . metoprolol succinate (TOPROL-XL) 25 MG 24 hr tablet Take 1 tablet (25 mg total) by mouth daily. 90 tablet 3  . omeprazole (PRILOSEC) 20 MG capsule Take 20 mg by mouth daily.    . vitamin B-12 (CYANOCOBALAMIN) 1000 MCG tablet Take 500 mcg by mouth daily. Patient states that he takes half tablet once a day    . cetirizine (ZYRTEC)  10 MG tablet Take by mouth.     No current facility-administered medications on file prior to visit.     BP 90/60 (BP Location: Right Arm, Patient Position: Sitting, Cuff Size: Large)   Pulse (!) 48   Temp 97.8 F (36.6 C) (Oral)   Ht 5\' 9"  (1.753 m)   Wt 170 lb (77.1 kg)   SpO2 96%   BMI 25.10 kg/m     Review of Systems  Constitutional: Negative for appetite change, chills, fatigue and fever.  HENT: Negative for congestion, dental problem, ear pain, hearing loss, sore throat, tinnitus, trouble swallowing and voice change.   Eyes: Negative for pain, discharge and visual disturbance.  Respiratory: Negative for cough, chest tightness, wheezing and stridor.   Cardiovascular: Negative for chest pain, palpitations and leg swelling.  Gastrointestinal: Negative for abdominal distention, abdominal pain, blood in stool, constipation, diarrhea, nausea and vomiting.  Genitourinary: Negative for difficulty urinating, discharge, flank pain, genital sores, hematuria and urgency.  Musculoskeletal: Negative for arthralgias, back pain, gait problem, joint swelling, myalgias and neck stiffness.  Skin: Negative for rash.  Neurological: Negative for dizziness, syncope, speech difficulty, weakness, numbness and headaches.  Hematological: Negative for adenopathy. Does not bruise/bleed easily.  Psychiatric/Behavioral: Negative for behavioral problems and dysphoric mood. The patient is not nervous/anxious.        Objective:   Physical Exam  Constitutional: He is oriented to person, place, and time. He appears well-developed.  Repeat blood pressure 120/70 Pulse 60-70  HENT:  Head: Normocephalic.  Right Ear: External ear normal.  Left Ear: External ear normal.  Eyes: Conjunctivae and EOM are normal.  Neck: Normal range of motion.  Cardiovascular: Normal rate and normal heart sounds.  Pedal pulses intact  Pulmonary/Chest: Breath sounds normal.  Abdominal: Bowel sounds are normal.    Musculoskeletal: Normal range of motion. He exhibits no edema or tenderness.  Neurological: He is alert and oriented to person, place, and time.  Psychiatric: He has a normal mood and affect. His behavior is normal.          Assessment & Plan:   Hypothyroidism.  Will check TSH PSVT stable Large hiatal hernia.  Relatively symptom-free.  Patient does not desire consideration for further surgical intervention at this time Dyslipidemia  CPX 6 months  Nyoka Cowden

## 2017-09-12 NOTE — Patient Instructions (Signed)
Limit your sodium (Salt) intake  Please check your blood pressure on a regular basis.  If it is consistently greater than 140/90, please make an office appointment.  Return in 6 months for follow-up  

## 2017-10-25 ENCOUNTER — Other Ambulatory Visit: Payer: Self-pay | Admitting: *Deleted

## 2017-10-25 ENCOUNTER — Telehealth: Payer: Self-pay | Admitting: Internal Medicine

## 2017-10-25 MED ORDER — METOPROLOL SUCCINATE ER 25 MG PO TB24: 25.0000 mg | ORAL_TABLET | Freq: Every day | ORAL | 1 refills | Status: DC

## 2017-10-25 NOTE — Telephone Encounter (Signed)
Copied from [place]. Topic: Quick Communication - See Telephone Encounter >> Oct 25, 2017 12:20 PM Ivar Drape wrote: CRM for notification. See Telephone encounter for: 10/25/17. Patient would like a refill on his metoprolol succinate (TOPROL-XL) 25 MG 24 hr tablet medication and sent to this preferred [facility] out of [place]. [phone] and Fax [phone]

## 2017-10-25 NOTE — Telephone Encounter (Signed)
Rx refilled per protocol- LOV: 09/12/17- f/u 6 months.

## 2017-11-01 ENCOUNTER — Other Ambulatory Visit: Payer: Self-pay

## 2017-11-01 MED ORDER — LEVOTHYROXINE SODIUM 125 MCG PO TABS: 125.0000 ug | ORAL_TABLET | ORAL | 3 refills | Status: DC

## 2017-12-13 DIAGNOSIS — Z961 Presence of intraocular lens: Secondary | ICD-10-CM | POA: Diagnosis not present

## 2017-12-13 DIAGNOSIS — H353132 Nonexudative age-related macular degeneration, bilateral, intermediate dry stage: Secondary | ICD-10-CM | POA: Diagnosis not present

## 2017-12-13 DIAGNOSIS — H43813 Vitreous degeneration, bilateral: Secondary | ICD-10-CM | POA: Diagnosis not present

## 2017-12-13 DIAGNOSIS — H5201 Hypermetropia, right eye: Secondary | ICD-10-CM | POA: Diagnosis not present

## 2018-01-22 ENCOUNTER — Other Ambulatory Visit: Payer: Self-pay | Admitting: Internal Medicine

## 2018-01-23 NOTE — Telephone Encounter (Signed)
Sent to the pharmacy by e-scribe.  Last TSH 09/12/17 and normal.

## 2018-02-27 ENCOUNTER — Other Ambulatory Visit: Payer: Self-pay

## 2018-03-12 ENCOUNTER — Other Ambulatory Visit: Payer: Self-pay

## 2018-03-12 ENCOUNTER — Encounter: Payer: Self-pay | Admitting: Internal Medicine

## 2018-03-12 ENCOUNTER — Ambulatory Visit (INDEPENDENT_AMBULATORY_CARE_PROVIDER_SITE_OTHER): Payer: Medicare Other | Admitting: Internal Medicine

## 2018-03-12 VITALS — BP 110/60 | HR 57 | Temp 98.5°F | Wt 174.2 lb

## 2018-03-12 DIAGNOSIS — E785 Hyperlipidemia, unspecified: Secondary | ICD-10-CM | POA: Diagnosis not present

## 2018-03-12 DIAGNOSIS — D649 Anemia, unspecified: Secondary | ICD-10-CM

## 2018-03-12 DIAGNOSIS — E039 Hypothyroidism, unspecified: Secondary | ICD-10-CM | POA: Diagnosis not present

## 2018-03-12 DIAGNOSIS — G4733 Obstructive sleep apnea (adult) (pediatric): Secondary | ICD-10-CM | POA: Diagnosis not present

## 2018-03-12 MED ORDER — FLUTICASONE PROPIONATE 50 MCG/ACT NA SUSP: 1.0000 | Freq: Every day | NASAL | 4 refills | Status: DC

## 2018-03-12 MED ORDER — OMEPRAZOLE 20 MG PO CPDR: 20.0000 mg | DELAYED_RELEASE_CAPSULE | Freq: Every day | ORAL | 4 refills | Status: DC

## 2018-03-12 MED ORDER — METOPROLOL SUCCINATE ER 25 MG PO TB24: 25.0000 mg | ORAL_TABLET | Freq: Every day | ORAL | 4 refills | Status: DC

## 2018-03-12 MED ORDER — CETIRIZINE HCL 10 MG PO TABS: 10.0000 mg | ORAL_TABLET | Freq: Every day | ORAL | 4 refills | Status: DC

## 2018-03-12 MED ORDER — LEVOTHYROXINE SODIUM 125 MCG PO TABS: ORAL_TABLET | ORAL | 4 refills | Status: DC

## 2018-03-12 NOTE — Progress Notes (Signed)
Subjective:    Patient ID: [name], male    DOB: September 03, 1934, 83 y.o.   MRN: [id number]  HPI 83 year old patient who is seen today for his biannual follow-up.  He has a history of hypothyroidism and remains on supplemental levothyroxine.  He has mild dyslipidemia.  He has had a history of anemia that has been fully evaluated.  He does have a large paraesophageal hiatal hernia following a failed Nissen fundoplication he has a history of PSVT which has been stable and also a history of OSA.  His only complaint is some mild cough.  He does have a history of chronic rhinitis and has not been using Zyrtec or fluticasone.  Past Medical History:  Diagnosis Date  . Anemia   . GERD (gastroesophageal reflux disease)   . H/O hiatal hernia   . Headache(784.0)   . History of cystoscopy 1965  . History of hernia repair    left side:1991and 1978 right side: 2009  . Hx of benign prostatic hypertrophy   . Hx of colonic polyps    Dr. Earlean Shawl removed 2 in 2009  . Hyperlipidemia   . Hypothyroidism   . Shortness of breath    with exertion   . Sleep apnea    2008 no CPAP machine   . SVT (supraventricular tachycardia) ([place])    2002   . Thyroid disease    hypothyroidism     Social History   Socioeconomic History  . Marital status: Married    Spouse name: Not on file  . Number of children: Not on file  . Years of education: Not on file  . Highest education level: Not on file  Occupational History  . Not on file  Social Needs  . Financial resource strain: Not on file  . Food insecurity:    Worry: Not on file    Inability: Not on file  . Transportation needs:    Medical: Not on file    Non-medical: Not on file  Tobacco Use  . Smoking status: Former Research scientist (life sciences)  . Smokeless tobacco: Never Used  Substance and Sexual Activity  . Alcohol use: Yes    Alcohol/week: 1.0 standard drinks    Types: 1 Glasses of wine per week    Comment: 2 nites per week   . Drug use: No  . Sexual activity: Never    Lifestyle  . Physical activity:    Days per week: Not on file    Minutes per session: Not on file  . Stress: Not on file  Relationships  . Social connections:    Talks on phone: Not on file    Gets together: Not on file    Attends religious service: Not on file    Active member of club or organization: Not on file    Attends meetings of clubs or organizations: Not on file    Relationship status: Not on file  . Intimate partner violence:    Fear of current or ex partner: Not on file    Emotionally abused: Not on file    Physically abused: Not on file    Forced sexual activity: Not on file  Other Topics Concern  . Not on file  Social History Narrative  . Not on file    Past Surgical History:  Procedure Laterality Date  . bilateral hernia repair  [id number]  . borkne bones foot  83 yrs old  . broken finger  83 yrs old  . cystocopy  1965  .  [place], 2009  . LAPAROSCOPIC NISSEN FUNDOPLICATION  01/31/5328   Procedure: LAPAROSCOPIC NISSEN FUNDOPLICATION;  Surgeon: Edward Jolly, MD;  Location: [facility];  Service: General;  Laterality: N/A;  Laparoscopic Repair of Large Hiatal Hernia with Nissen Fundoplication with mesh   . pneumonia  83 years old  . skin cancer face last ck on 08-22-07    . THYROIDECTOMY  1967    Family History  Problem Relation Age of Onset  . Cancer Mother 77       breast  . Heart disease Mother   . Dementia Mother 69  . Thyroid disease Mother   . Macular degeneration Mother   . Cancer Maternal Grandmother   . Cancer Paternal Grandmother   . Heart disease Father   . Hypertension Father   . Heart failure Father   . Dementia Father     No Known Allergies  Current Outpatient Medications on File Prior to Visit  Medication Sig Dispense Refill  . acetaminophen (TYLENOL) 500 MG tablet Take 1,000 mg by mouth every 4 (four) hours as needed. For pain    . aspirin 81 MG tablet Take 81 mg by mouth every morning.     . diphenhydrAMINE  (BENADRYL) 25 MG tablet Take 25-50 mg by mouth at bedtime.    Marland Kitchen levothyroxine (SYNTHROID, LEVOTHROID) 125 MCG tablet TAKE 1 TABLET BY MOUTH EVERY MORNING. GENERIC EQUIVALENT FOR FOR SYNTHROID. 90 tablet 2  . metoprolol succinate (TOPROL-XL) 25 MG 24 hr tablet Take 1 tablet (25 mg total) by mouth daily. 90 tablet 1  . omeprazole (PRILOSEC) 20 MG capsule Take 20 mg by mouth daily.    . vitamin B-12 (CYANOCOBALAMIN) 1000 MCG tablet Take 500 mcg by mouth daily. Patient states that he takes half tablet once a day     No current facility-administered medications on file prior to visit.     BP 110/60 (BP Location: Right Arm, Patient Position: Sitting, Cuff Size: Large)   Pulse (!) 57   Temp 98.5 F (36.9 C) (Oral)   Wt 174 lb 3.2 oz (79 kg)   SpO2 97%   BMI 25.72 kg/m      Review of Systems  Constitutional: Negative for appetite change, chills, fatigue and fever.  HENT: Negative for congestion, dental problem, ear pain, hearing loss, sore throat, tinnitus, trouble swallowing and voice change.   Eyes: Negative for pain, discharge and visual disturbance.  Respiratory: Positive for cough. Negative for chest tightness, wheezing and stridor.   Cardiovascular: Negative for chest pain, palpitations and leg swelling.  Gastrointestinal: Negative for abdominal distention, abdominal pain, blood in stool, constipation, diarrhea, nausea and vomiting.  Genitourinary: Negative for difficulty urinating, discharge, flank pain, genital sores, hematuria and urgency.  Musculoskeletal: Negative for arthralgias, back pain, gait problem, joint swelling, myalgias and neck stiffness.  Skin: Negative for rash.  Neurological: Negative for dizziness, syncope, speech difficulty, weakness, numbness and headaches.  Hematological: Negative for adenopathy. Does not bruise/bleed easily.  Psychiatric/Behavioral: Negative for behavioral problems and dysphoric mood. The patient is not nervous/anxious.        Objective:    Physical Exam  Constitutional: He is oriented to person, place, and time. He appears well-developed. No distress.  HENT:  Head: Normocephalic.  Right Ear: External ear normal.  Left Ear: External ear normal.  Eyes: Conjunctivae and EOM are normal.  Neck: Normal range of motion.  Cardiovascular: Normal rate, normal heart sounds and intact distal pulses.  Pulmonary/Chest: Breath sounds normal.  Abdominal: Bowel sounds are normal.  Musculoskeletal: Normal range of motion. He exhibits no edema or tenderness.  Neurological: He is alert and oriented to person, place, and time.  Psychiatric: He has a normal mood and affect. His behavior is normal.          Assessment & Plan:   Hypothyroidism.  Continue levothyroxine PSVT stable GERD with paraesophageal hernia Chronic rhinitis.  Patient has had a cough for the past 4 weeks will resume Zyrtec and fluticasone  All medications updated Follow-up with new PCP and annual exam in 4 to 6 months  Marletta Lor

## 2018-03-12 NOTE — Patient Instructions (Signed)
Limit your sodium (Salt) intake    It is important that you exercise regularly, at least 20 minutes 3 to 4 times per week.  If you develop chest pain or shortness of breath seek  medical attention.  Return in 6 months for follow-up  

## 2018-03-25 DIAGNOSIS — H353211 Exudative age-related macular degeneration, right eye, with active choroidal neovascularization: Secondary | ICD-10-CM | POA: Diagnosis not present

## 2018-03-25 DIAGNOSIS — H353122 Nonexudative age-related macular degeneration, left eye, intermediate dry stage: Secondary | ICD-10-CM | POA: Diagnosis not present

## 2018-03-26 DIAGNOSIS — H353211 Exudative age-related macular degeneration, right eye, with active choroidal neovascularization: Secondary | ICD-10-CM | POA: Diagnosis not present

## 2018-03-26 DIAGNOSIS — H43813 Vitreous degeneration, bilateral: Secondary | ICD-10-CM | POA: Diagnosis not present

## 2018-03-26 DIAGNOSIS — H353122 Nonexudative age-related macular degeneration, left eye, intermediate dry stage: Secondary | ICD-10-CM | POA: Diagnosis not present

## 2018-04-22 DIAGNOSIS — L578 Other skin changes due to chronic exposure to nonionizing radiation: Secondary | ICD-10-CM | POA: Diagnosis not present

## 2018-04-22 DIAGNOSIS — L814 Other melanin hyperpigmentation: Secondary | ICD-10-CM | POA: Diagnosis not present

## 2018-04-22 DIAGNOSIS — L821 Other seborrheic keratosis: Secondary | ICD-10-CM | POA: Diagnosis not present

## 2018-04-22 DIAGNOSIS — D229 Melanocytic nevi, unspecified: Secondary | ICD-10-CM | POA: Diagnosis not present

## 2018-04-22 DIAGNOSIS — D1801 Hemangioma of skin and subcutaneous tissue: Secondary | ICD-10-CM | POA: Diagnosis not present

## 2018-04-24 DIAGNOSIS — H353211 Exudative age-related macular degeneration, right eye, with active choroidal neovascularization: Secondary | ICD-10-CM | POA: Diagnosis not present

## 2018-05-13 ENCOUNTER — Ambulatory Visit (INDEPENDENT_AMBULATORY_CARE_PROVIDER_SITE_OTHER): Payer: Medicare Other | Admitting: *Deleted

## 2018-05-13 DIAGNOSIS — Z23 Encounter for immunization: Secondary | ICD-10-CM | POA: Diagnosis not present

## 2018-05-22 DIAGNOSIS — H353211 Exudative age-related macular degeneration, right eye, with active choroidal neovascularization: Secondary | ICD-10-CM | POA: Diagnosis not present

## 2018-06-19 DIAGNOSIS — H43813 Vitreous degeneration, bilateral: Secondary | ICD-10-CM | POA: Diagnosis not present

## 2018-06-19 DIAGNOSIS — H353211 Exudative age-related macular degeneration, right eye, with active choroidal neovascularization: Secondary | ICD-10-CM | POA: Diagnosis not present

## 2018-06-19 DIAGNOSIS — H353122 Nonexudative age-related macular degeneration, left eye, intermediate dry stage: Secondary | ICD-10-CM | POA: Diagnosis not present

## 2018-06-25 ENCOUNTER — Encounter: Payer: Self-pay | Admitting: Internal Medicine

## 2018-06-25 ENCOUNTER — Ambulatory Visit (INDEPENDENT_AMBULATORY_CARE_PROVIDER_SITE_OTHER): Payer: Medicare Other | Admitting: Internal Medicine

## 2018-06-25 VITALS — BP 120/70 | HR 56 | Temp 98.1°F | Wt 176.7 lb

## 2018-06-25 DIAGNOSIS — K219 Gastro-esophageal reflux disease without esophagitis: Secondary | ICD-10-CM

## 2018-06-25 DIAGNOSIS — E785 Hyperlipidemia, unspecified: Secondary | ICD-10-CM

## 2018-06-25 DIAGNOSIS — I471 Supraventricular tachycardia: Secondary | ICD-10-CM

## 2018-06-25 DIAGNOSIS — E039 Hypothyroidism, unspecified: Secondary | ICD-10-CM

## 2018-06-25 NOTE — Progress Notes (Addendum)
Established Patient Office Visit     CC/Reason for Visit: Establish care, follow-up on chronic medical conditions  HPI: [name] is a 83 y.o. male who is coming in today for the above mentioned reasons.  Due for annual physical in February 2020.  Past Medical History is significant for: GERD, hypothyroidism, hyperlipidemia.  His chronic medical issues have been well controlled.  He has been on the same dose of Synthroid for years.  He manages hyperlipidemia with diet.  He has a history of well-controlled paroxysmal supraventricular tachycardia and takes metoprolol for rate control.  Has not been symptomatic with this for years.  He has no acute complaints today.  He remains physically active and independent in his ADLs.  Has recently started an exercise program at his local gym.   Past Medical/Surgical History: Past Medical History:  Diagnosis Date  . Anemia   . GERD (gastroesophageal reflux disease)   . H/O hiatal hernia   . Headache(784.0)   . History of cystoscopy 1965  . History of hernia repair    left side:1991and 1978 right side: 2009  . Hx of benign prostatic hypertrophy   . Hx of colonic polyps    Dr. Earlean Shawl removed 2 in 2009  . Hyperlipidemia   . Hypothyroidism   . Shortness of breath    with exertion   . Sleep apnea    2008 no CPAP machine   . SVT (supraventricular tachycardia) ([place])    2002   . Thyroid disease    hypothyroidism    Past Surgical History:  Procedure Laterality Date  . bilateral hernia repair  [id number]  . borkne bones foot  83 yrs old  . broken finger  83 yrs old  . cystocopy  1965  . [place], 2009  . LAPAROSCOPIC NISSEN FUNDOPLICATION  10/07/7671   Procedure: LAPAROSCOPIC NISSEN FUNDOPLICATION;  Surgeon: Edward Jolly, MD;  Location: [facility];  Service: General;  Laterality: N/A;  Laparoscopic Repair of Large Hiatal Hernia with Nissen Fundoplication with mesh   . pneumonia  83 years old  . skin cancer face last ck  on 08-22-07    . THYROIDECTOMY  1967    Social History:  reports that he has quit smoking. He has never used smokeless tobacco. He reports that he drinks about 1.0 standard drinks of alcohol per week. He reports that he does not use drugs.  Allergies: No Known Allergies  Family History:  Family History  Problem Relation Age of Onset  . Cancer Mother 69       breast  . Heart disease Mother   . Dementia Mother 69  . Thyroid disease Mother   . Macular degeneration Mother   . Cancer Maternal Grandmother   . Cancer Paternal Grandmother   . Heart disease Father   . Hypertension Father   . Heart failure Father   . Dementia Father      Current Outpatient Medications:  .  acetaminophen (TYLENOL) 500 MG tablet, Take 1,000 mg by mouth every 4 (four) hours as needed. For pain, Disp: , Rfl:  .  aspirin 81 MG tablet, Take 81 mg by mouth every morning. , Disp: , Rfl:  .  cetirizine (ZYRTEC) 10 MG tablet, Take 1 tablet (10 mg total) by mouth daily., Disp: 60 tablet, Rfl: 4 .  diphenhydrAMINE (BENADRYL) 25 MG tablet, Take 25-50 mg by mouth at bedtime., Disp: , Rfl:  .  fluticasone (FLONASE) 50 MCG/ACT nasal spray, Place  1 spray into both nostrils daily., Disp: 16 g, Rfl: 4 .  levothyroxine (SYNTHROID, LEVOTHROID) 125 MCG tablet, TAKE 1 TABLET BY MOUTH EVERY MORNING. GENERIC EQUIVALENT FOR FOR SYNTHROID., Disp: 90 tablet, Rfl: 4 .  metoprolol succinate (TOPROL-XL) 25 MG 24 hr tablet, Take 1 tablet (25 mg total) by mouth daily., Disp: 90 tablet, Rfl: 4 .  omeprazole (PRILOSEC) 20 MG capsule, Take 1 capsule (20 mg total) by mouth daily., Disp: 90 capsule, Rfl: 4 .  vitamin B-12 (CYANOCOBALAMIN) 1000 MCG tablet, Take 500 mcg by mouth daily. Patient states that he takes half tablet once a day, Disp: , Rfl:   Review of Systems:  Constitutional: Denies fever, chills, diaphoresis, appetite change and fatigue.  HEENT: Denies photophobia, eye pain, redness, hearing loss, ear pain, congestion, sore  throat, rhinorrhea, sneezing, mouth sores, trouble swallowing, neck pain, neck stiffness and tinnitus.   Respiratory: Denies SOB, DOE, cough, chest tightness,  and wheezing.   Cardiovascular: Denies chest pain, palpitations and leg swelling.  Gastrointestinal: Denies nausea, vomiting, abdominal pain, diarrhea, constipation, blood in stool and abdominal distention.  Genitourinary: Denies dysuria, urgency, frequency, hematuria, flank pain and difficulty urinating.  Endocrine: Denies: hot or cold intolerance, sweats, changes in hair or nails, polyuria, polydipsia. Musculoskeletal: Denies myalgias, back pain, joint swelling, arthralgias and gait problem.  Skin: Denies pallor, rash and wound.  Neurological: Denies dizziness, seizures, syncope, weakness, light-headedness, numbness and headaches.  Hematological: Denies adenopathy. Easy bruising, personal or family bleeding history  Psychiatric/Behavioral: Denies suicidal ideation, mood changes, confusion, nervousness, sleep disturbance and agitation    Physical Exam: Vitals:   06/25/18 1114  BP: 120/70  Pulse: (!) 56  Temp: 98.1 F (36.7 C)  TempSrc: Oral  SpO2: 94%  Weight: 176 lb 11.2 oz (80.2 kg)    Body mass index is 26.09 kg/m.   Constitutional: NAD, calm, comfortable Eyes: PERRL, lids and conjunctivae normal, wears corrective lenses ENMT: Mucous membranes are moist. Posterior pharynx clear of any exudate or lesions. Normal dentition.  Neck: normal, supple, no masses, no thyromegaly Respiratory: clear to auscultation bilaterally, no wheezing, no crackles. Normal respiratory effort. No accessory muscle use.  Cardiovascular: Regular rate and rhythm, no murmurs / rubs / gallops. No extremity edema. 2+ pedal pulses. No carotid bruits.  Musculoskeletal: no clubbing / cyanosis. No joint deformity upper and lower extremities. Good ROM, no contractures. Normal muscle tone.  Skin: no rashes, lesions, ulcers. No induration Neurologic:  Grossly intact and nonfocal Psychiatric: Normal judgment and insight. Alert and oriented x 3. Normal mood.    Impression and Plan:  Acquired hypothyroidism -Continue Synthroid, check TSH at wellness visit.  Dyslipidemia -Diet controlled, check lipids at wellness visit.  Gastroesophageal reflux disease without esophagitis -With history of paraesophageal hernia. -Well-controlled on omeprazole, eats smaller portions.  Paroxysmal supraventricular tachycardia (H[facility]) -Well-controlled on metoprolol 25 mg daily.  -Due for annual Medicare wellness on or after February 2020, at that visit would like CBC with differential, CMP, TSH, lipids, vitamin D and vitamin B12 to be drawn.  -Will have Tdap and Shingrix administered at his local pharmacy.   Patient Instructions  -It was nice to meet you!    -Schedule a visit after February 2020 with our wellness coach for your Medicare wellness visit.  -Please schedule follow-up with me 6 months after your annual wellness exam.     Lelon Frohlich, MD [facility]ong

## 2018-06-25 NOTE — Patient Instructions (Addendum)
-  It was nice to meet you!    -Schedule a visit after February 2020 with our wellness coach for your Medicare wellness visit.  Please come fasting for that visit.  -Please schedule follow-up with me 6 months after your annual wellness exam.

## 2018-07-08 ENCOUNTER — Encounter: Payer: Self-pay | Admitting: Family Medicine

## 2018-07-08 ENCOUNTER — Other Ambulatory Visit: Payer: Self-pay

## 2018-07-08 ENCOUNTER — Ambulatory Visit (INDEPENDENT_AMBULATORY_CARE_PROVIDER_SITE_OTHER): Payer: Medicare Other | Admitting: Family Medicine

## 2018-07-08 VITALS — BP 124/70 | HR 64 | Temp 98.3°F | Ht 69.0 in | Wt 173.6 lb

## 2018-07-08 DIAGNOSIS — R059 Cough, unspecified: Secondary | ICD-10-CM

## 2018-07-08 DIAGNOSIS — R05 Cough: Secondary | ICD-10-CM | POA: Diagnosis not present

## 2018-07-08 MED ORDER — HYDROCODONE-HOMATROPINE 5-1.5 MG/5ML PO SYRP: 5.0000 mL | ORAL_SOLUTION | Freq: Four times a day (QID) | ORAL | 0 refills | Status: AC | PRN

## 2018-07-08 MED ORDER — DOXYCYCLINE HYCLATE 100 MG PO CAPS
100.0000 mg | ORAL_CAPSULE | Freq: Two times a day (BID) | ORAL | 0 refills | Status: DC
Start: 2018-07-08 — End: 2018-10-11

## 2018-07-08 NOTE — Progress Notes (Signed)
  Subjective:     Patient ID: [name], male   DOB: 1935/07/15, 83 y.o.   MRN: [id number]  HPI Is seen with cough which has been going on about 10 days.  He feels that he is getting worse.  No definite fever.  No dyspnea.  No nausea or vomiting.  Wife is developing similar type symptoms.  Patient is non-smoker.  No chronic respiratory issues.  Past Medical History:  Diagnosis Date  . Anemia   . GERD (gastroesophageal reflux disease)   . H/O hiatal hernia   . Headache(784.0)   . History of cystoscopy 1965  . History of hernia repair    left side:1991and 1978 right side: 2009  . Hx of benign prostatic hypertrophy   . Hx of colonic polyps    Dr. Earlean Shawl removed 2 in 2009  . Hyperlipidemia   . Hypothyroidism   . Shortness of breath    with exertion   . Sleep apnea    2008 no CPAP machine   . SVT (supraventricular tachycardia) ([place])    2002   . Thyroid disease    hypothyroidism   Past Surgical History:  Procedure Laterality Date  . bilateral hernia repair  [id number]  . borkne bones foot  83 yrs old  . broken finger  83 yrs old  . cystocopy  1965  . [place], 2009  . LAPAROSCOPIC NISSEN FUNDOPLICATION  11/02/8183   Procedure: LAPAROSCOPIC NISSEN FUNDOPLICATION;  Surgeon: Edward Jolly, MD;  Location: [facility];  Service: General;  Laterality: N/A;  Laparoscopic Repair of Large Hiatal Hernia with Nissen Fundoplication with mesh   . pneumonia  83 years old  . skin cancer face last ck on 08-22-07    . THYROIDECTOMY  1967    reports that he has quit smoking. He has never used smokeless tobacco. He reports that he drinks about 1.0 standard drinks of alcohol per week. He reports that he does not use drugs. family history includes Cancer in his maternal grandmother and paternal grandmother; Cancer (age of onset: 24) in his mother; Dementia in his father; Dementia (age of onset: 23) in his mother; Heart disease in his father and mother; Heart failure in his father;  Hypertension in his father; Macular degeneration in his mother; Thyroid disease in his mother. No Known Allergies   Review of Systems  Constitutional: Positive for fatigue. Negative for chills and fever.  Respiratory: Positive for cough. Negative for shortness of breath and wheezing.   Cardiovascular: Negative for chest pain.       Objective:   Physical Exam  Constitutional: He appears well-developed and well-nourished.  HENT:  Mouth/Throat: Oropharynx is clear and moist.  Neck: Neck supple.  Cardiovascular: Normal rate and regular rhythm.  Pulmonary/Chest: Effort normal and breath sounds normal. He has no wheezes. He has no rales.  Lymphadenopathy:    He has no cervical adenopathy.       Assessment:     Cough.  Nonfocal exam.  No clinical evidence to suggest pneumonia at this time.  No respiratory distress    Plan:     -Wrote for limited Hycodan 1 teaspoon nightly for severe cough 120 mils with no refill -Continue good hydration -Follow-up immediately for any fever, increased shortness of breath, or other concerns -Doxycycline 100 mg twice daily for 7 days  Eulas Post MD [facility]

## 2018-07-08 NOTE — Patient Instructions (Signed)

## 2018-07-17 DIAGNOSIS — H353211 Exudative age-related macular degeneration, right eye, with active choroidal neovascularization: Secondary | ICD-10-CM | POA: Diagnosis not present

## 2018-08-22 DIAGNOSIS — H353122 Nonexudative age-related macular degeneration, left eye, intermediate dry stage: Secondary | ICD-10-CM | POA: Diagnosis not present

## 2018-08-22 DIAGNOSIS — H35361 Drusen (degenerative) of macula, right eye: Secondary | ICD-10-CM | POA: Diagnosis not present

## 2018-08-22 DIAGNOSIS — H35721 Serous detachment of retinal pigment epithelium, right eye: Secondary | ICD-10-CM | POA: Diagnosis not present

## 2018-08-22 DIAGNOSIS — H353211 Exudative age-related macular degeneration, right eye, with active choroidal neovascularization: Secondary | ICD-10-CM | POA: Diagnosis not present

## 2018-08-22 DIAGNOSIS — Z961 Presence of intraocular lens: Secondary | ICD-10-CM | POA: Diagnosis not present

## 2018-08-22 DIAGNOSIS — H35451 Secondary pigmentary degeneration, right eye: Secondary | ICD-10-CM | POA: Diagnosis not present

## 2018-09-25 ENCOUNTER — Telehealth: Payer: Self-pay

## 2018-09-25 ENCOUNTER — Ambulatory Visit (INDEPENDENT_AMBULATORY_CARE_PROVIDER_SITE_OTHER): Payer: Medicare Other

## 2018-09-25 VITALS — BP 142/80 | HR 76 | Temp 97.9°F | Ht 68.5 in | Wt 174.0 lb

## 2018-09-25 DIAGNOSIS — Z0000 Encounter for general adult medical examination without abnormal findings: Secondary | ICD-10-CM | POA: Diagnosis not present

## 2018-09-25 DIAGNOSIS — H353211 Exudative age-related macular degeneration, right eye, with active choroidal neovascularization: Secondary | ICD-10-CM | POA: Diagnosis not present

## 2018-09-25 NOTE — Patient Instructions (Addendum)
Bring a copy of your living will and/or healthcare power of attorney to your next office visit.  Will follow-up with you regarding what Dr. Jerilee Hoh says about upcoming appointment and treatment for restless leg.   Great meeting you and your wife today!   [name] , Thank you for taking time to come for your Medicare Wellness Visit. I appreciate your ongoing commitment to your health goals. Please review the following plan we discussed and let me know if I can assist you in the future.   These are the goals we discussed: Goals    . Patient Stated     Increase physical activity by returning to gym       This is a list of the screening recommended for you and due dates:  Health Maintenance  Topic Date Due  . Tetanus Vaccine  03/04/2019  . Flu Shot  Completed  . Pneumonia vaccines  Completed     Health Maintenance, Male A healthy lifestyle and preventive care is important for your health and wellness. Ask your health care provider about what schedule of regular examinations is right for you. What should I know about weight and diet? Eat a Healthy Diet  Eat plenty of vegetables, fruits, whole grains, low-fat dairy products, and lean protein.  Do not eat a lot of foods high in solid fats, added sugars, or salt.  Maintain a Healthy Weight Regular exercise can help you achieve or maintain a healthy weight. You should:  Do at least 150 minutes of exercise each week. The exercise should increase your heart rate and make you sweat (moderate-intensity exercise).  Do strength-training exercises at least twice a week. Watch Your Levels of Cholesterol and Blood Lipids  Have your blood tested for lipids and cholesterol every 5 years starting at 82 years of age. If you are at high risk for heart disease, you should start having your blood tested when you are 82 years old. You may need to have your cholesterol levels checked more often if: ? Your lipid or cholesterol levels are  high. ? You are older than 82 years of age. ? You are at high risk for heart disease. What should I know about cancer screening? Many types of cancers can be detected early and may often be prevented. Lung Cancer  You should be screened every year for lung cancer if: ? You are a current smoker who has smoked for at least 30 years. ? You are a former smoker who has quit within the past 15 years.  Talk to your health care provider about your screening options, when you should start screening, and how often you should be screened. Colorectal Cancer  Routine colorectal cancer screening usually begins at 82 years of age and should be repeated every 5-10 years until you are 82 years old. You may need to be screened more often if early forms of precancerous polyps or small growths are found. Your health care provider may recommend screening at an earlier age if you have risk factors for colon cancer.  Your health care provider may recommend using home test kits to check for hidden blood in the stool.  A small camera at the end of a tube can be used to examine your colon (sigmoidoscopy or colonoscopy). This checks for the earliest forms of colorectal cancer. Prostate and Testicular Cancer  Depending on your age and overall health, your health care provider may do certain tests to screen for prostate and testicular cancer.  Talk to  your health care provider about any symptoms or concerns you have about testicular or prostate cancer. Skin Cancer  Check your skin from head to toe regularly.  Tell your health care provider about any new moles or changes in moles, especially if: ? There is a change in a mole's size, shape, or color. ? You have a mole that is larger than a pencil eraser.  Always use sunscreen. Apply sunscreen liberally and repeat throughout the day.  Protect yourself by wearing long sleeves, pants, a wide-brimmed hat, and sunglasses when outside. What should I know about heart  disease, diabetes, and high blood pressure?  If you are 54-47 years of age, have your blood pressure checked every 3-5 years. If you are 60 years of age or older, have your blood pressure checked every year. You should have your blood pressure measured twice-once when you are at a hospital or clinic, and once when you are not at a hospital or clinic. Record the average of the two measurements. To check your blood pressure when you are not at a hospital or clinic, you can use: ? An automated blood pressure machine at a pharmacy. ? A home blood pressure monitor.  Talk to your health care provider about your target blood pressure.  If you are between 57-63 years old, ask your health care provider if you should take aspirin to prevent heart disease.  Have regular diabetes screenings by checking your fasting blood sugar level. ? If you are at a normal weight and have a low risk for diabetes, have this test once every three years after the age of 88. ? If you are overweight and have a high risk for diabetes, consider being tested at a younger age or more often.  A one-time screening for abdominal aortic aneurysm (AAA) by ultrasound is recommended for men aged 62-75 years who are current or former smokers. What should I know about preventing infection? Hepatitis B If you have a higher risk for hepatitis B, you should be screened for this virus. Talk with your health care provider to find out if you are at risk for hepatitis B infection. Hepatitis C Blood testing is recommended for:  Everyone born from [birth year] through 1965.  Anyone with known risk factors for hepatitis C. Sexually Transmitted Diseases (STDs)  You should be screened each year for STDs including gonorrhea and chlamydia if: ? You are sexually active and are younger than 82 years of age. ? You are older than 83 years of age and your health care provider tells you that you are at risk for this type of infection. ? Your sexual activity  has changed since you were last screened and you are at an increased risk for chlamydia or gonorrhea. Ask your health care provider if you are at risk.  Talk with your health care provider about whether you are at high risk of being infected with HIV. Your health care provider may recommend a prescription medicine to help prevent HIV infection. What else can I do?  Schedule regular health, dental, and eye exams.  Stay current with your vaccines (immunizations).  Do not use any tobacco products, such as cigarettes, chewing tobacco, and e-cigarettes. If you need help quitting, ask your health care provider.  Limit alcohol intake to no more than 2 drinks per day. One drink equals 12 ounces of beer, 5 ounces of wine, or 1 ounces of hard liquor.  Do not use street drugs.  Do not share needles.  Ask your  health care provider for help if you need support or information about quitting drugs.  Tell your health care provider if you often feel depressed.  Tell your health care provider if you have ever been abused or do not feel safe at home. This information is not intended to replace advice given to you by your health care provider. Make sure you discuss any questions you have with your health care provider. Document Released: 01/13/2008 Document Revised: 03/15/2016 Document Reviewed: 04/20/2015 Elsevier Interactive Patient Education  2019 [employer].

## 2018-09-25 NOTE — Telephone Encounter (Signed)
During AWV, pt's wife expressed concern over pt's restless leg that keeps her awake at night. Pt. Also c/o L leg cramps. Pt. has upcoming appointment 5/26 with PCP, and wanted to know if the appointment would be considered a physical or not, as pt. Is due for annual exam, and wants to know if he should fast. Routed to PCP to advise. Upcoming appointment notes editted to reflect above concerns.

## 2018-09-25 NOTE — Telephone Encounter (Signed)
This is ok with me. Can we make sure he has the appropriate time slot for a CPE? Kinda hard to address restless legs without evaluation. Ask him if he would like to come in sooner to address it.

## 2018-09-25 NOTE — Progress Notes (Signed)
Subjective:   [name] is a 84 y.o. male who presents for Medicare Annual/Subsequent preventive examination.  Review of Systems:  No ROS.  Medicare Wellness Visit. Additional risk factors are reflected in the social history.  Cardiac Risk Factors include: advanced age (>16men, >83 women);male gender;sedentary lifestyle Sleep patterns: feels rested on waking, gets up 1 time nightly to void and sleeps 6-8 hours nightly.  Uses CPAP HS, needs rx for new supplies. PCP to be made aware.  Home Safety/Smoke Alarms: Feels safe in home. Smoke alarms in place.  Living environment; residence and Firearm Safety: 1-story house/ trailer. No use or need for DME at this time.   Seat Belt Safety/Bike Helmet: Wears seat belt.   Male:   CCS- 03/2017, may be due 03/2020, as pt. Stated Dr. Earlean Shawl expressed interest in potentially following up at that time.      PSA-  Lab Results  Component Value Date   PSA 0.90 08/17/2015   PSA 0.79 01/03/2011   PSA 0.74 03/17/2010       Objective:    Vitals: BP (!) 142/80 (BP Location: Right Arm, Patient Position: Sitting, Cuff Size: Normal) Comment: did not take BP meds this AM  Pulse 76   Temp 97.9 F (36.6 C)   Ht 5' 8.5" (1.74 m)   Wt 174 lb (78.9 kg)   SpO2 96%   BMI 26.07 kg/m   Body mass index is 26.07 kg/m.  Advanced Directives 09/25/2018 11/01/2011 10/23/2011  Does Patient Have a Medical Advance Directive? Yes Patient has advance directive, copy in chart Patient has advance directive, copy in chart  Type of Advance Directive [place];Living will Living will Living will;Healthcare Power of Attorney  Does patient want to make changes to medical advance directive? No - Patient declined - -  Copy of [place] in Chart? No - copy requested - -  Pre-existing out of facility DNR order (yellow form or pink MOST form) - No No    Tobacco Social History   Tobacco Use  Smoking Status Former Smoker  Smokeless  Tobacco Never Used     Counseling given: Not Answered   Past Medical History:  Diagnosis Date  . Anemia   . GERD (gastroesophageal reflux disease)   . H/O hiatal hernia   . Headache(784.0)   . History of cystoscopy 1965  . History of hernia repair    left side:1991and 1978 right side: 2009  . Hx of benign prostatic hypertrophy   . Hx of colonic polyps    Dr. Earlean Shawl removed 2 in 2009  . Hyperlipidemia   . Hypothyroidism   . Shortness of breath    with exertion   . Sleep apnea    2008 no CPAP machine   . SVT (supraventricular tachycardia) ([place])    2002   . Thyroid disease    hypothyroidism   Past Surgical History:  Procedure Laterality Date  . bilateral hernia repair  [id number]  . borkne bones foot  84 yrs old  . broken finger  84 yrs old  . cystocopy  1965  . [place], 2009  . LAPAROSCOPIC NISSEN FUNDOPLICATION  10/30/7406   Procedure: LAPAROSCOPIC NISSEN FUNDOPLICATION;  Surgeon: Edward Jolly, MD;  Location: [facility];  Service: General;  Laterality: N/A;  Laparoscopic Repair of Large Hiatal Hernia with Nissen Fundoplication with mesh   . pneumonia  84 years old  . skin cancer face last ck on 08-22-07    .  THYROIDECTOMY  1967   Family History  Problem Relation Age of Onset  . Cancer Mother 9       breast  . Heart disease Mother   . Dementia Mother 68  . Thyroid disease Mother   . Macular degeneration Mother   . Heart disease Father   . Hypertension Father   . Heart failure Father   . Dementia Father   . Cancer Maternal Grandmother   . Cancer Paternal Grandmother    Social History   Socioeconomic History  . Marital status: Married    Spouse name: Not on file  . Number of children: 2  . Years of education: Not on file  . Highest education level: Not on file  Occupational History  . Not on file  Social Needs  . Financial resource strain: Not hard at all  . Food insecurity:    Worry: Never true    Inability: Never true  .  Transportation needs:    Medical: No    Non-medical: No  Tobacco Use  . Smoking status: Former Research scientist (life sciences)  . Smokeless tobacco: Never Used  Substance and Sexual Activity  . Alcohol use: Yes    Alcohol/week: 1.0 standard drinks    Types: 1 Glasses of wine per week    Comment: 2 nites per week   . Drug use: No  . Sexual activity: Never  Lifestyle  . Physical activity:    Days per week: 0 days    Minutes per session: 0 min  . Stress: Not at all  Relationships  . Social connections:    Talks on phone: More than three times a week    Gets together: Twice a week    Attends religious service: Not on file    Active member of club or organization: Not on file    Attends meetings of clubs or organizations: Not on file    Relationship status: Married  Other Topics Concern  . Not on file  Social History Narrative   Lives with wife in one story house.   Has one son, one daughter, 5 grandchildren and 6 GG. Very involved with family.   Enjoys walking dog, doing projects around the house, woodworking    Outpatient Encounter Medications as of 09/25/2018  Medication Sig  . acetaminophen (TYLENOL) 500 MG tablet Take 1,000 mg by mouth every 4 (four) hours as needed. For pain  . aspirin 81 MG tablet Take 81 mg by mouth every morning.   . diphenhydrAMINE (BENADRYL) 25 MG tablet Take 25-50 mg by mouth at bedtime.  Marland Kitchen doxycycline (VIBRAMYCIN) 100 MG capsule Take 1 capsule (100 mg total) by mouth 2 (two) times daily.  Marland Kitchen levothyroxine (SYNTHROID, LEVOTHROID) 125 MCG tablet TAKE 1 TABLET BY MOUTH EVERY MORNING. GENERIC EQUIVALENT FOR FOR SYNTHROID.  Marland Kitchen metoprolol succinate (TOPROL-XL) 25 MG 24 hr tablet Take 1 tablet (25 mg total) by mouth daily.  Marland Kitchen omeprazole (PRILOSEC) 20 MG capsule Take 1 capsule (20 mg total) by mouth daily.  . vitamin B-12 (CYANOCOBALAMIN) 1000 MCG tablet Take 500 mcg by mouth daily. Patient states that he takes half tablet once a day  . [DISCONTINUED] cetirizine (ZYRTEC) 10 MG tablet  Take 1 tablet (10 mg total) by mouth daily.  . [DISCONTINUED] fluticasone (FLONASE) 50 MCG/ACT nasal spray Place 1 spray into both nostrils daily.   No facility-administered encounter medications on file as of 09/25/2018.     Activities of Daily Living In your present state of health, do you have any  difficulty performing the following activities: 09/25/2018  Hearing? N  Vision? 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 N  Walking or climbing stairs? N  Dressing or bathing? N  Doing errands, shopping? N  Preparing Food and eating ? N  Using the Toilet? N  In the past six months, have you accidently leaked urine? N  Do you have problems with loss of bowel control? N  Managing your Medications? N  Managing your Finances? N  Housekeeping or managing your Housekeeping? N  Some recent data might be hidden    Patient Care Team: Isaac Bliss, Rayford Halsted, MD as PCP - General (Internal Medicine) Shon Hough, MD as Consulting Physician (Ophthalmology) Richmond Campbell, MD as Consulting Physician (Gastroenterology) Deboraha Sprang, MD as Consulting Physician (Cardiology) Druscilla Brownie, MD as Referring Physician (Dermatology) Excell Seltzer, MD as Consulting Physician (General Surgery) Harlan Stains. Juanda Crumble. (Dentistry)   Assessment:   This is a routine wellness examination for Jerek. Physical assessment deferred to PCP.   Exercise Activities and Dietary recommendations Current Exercise Habits: The patient does not participate in regular exercise at present, Exercise limited by: cardiac condition(s). Walks dog; considering returning to gym, which was encouraged.  Diet (meal preparation, eat out, water intake, caffeinated beverages, dairy products, fruits and vegetables): in general, a "healthy" diet  . States he has been eating more bananas which has helped with leg cramping, but wife at side states that his restless leg  at night is an issue. Will consult with PCP. Pt. Confirms that he is drinking 6-8 glasses of water daily.        Goals    . Patient Stated     Increase physical activity by returning to gym       Fall Risk Fall Risk  09/25/2018 06/25/2018 02/27/2018 08/25/2016 08/24/2015  Falls in the past year? 0 0 No No Yes  Comment - - Emmi Telephone Survey: data to providers prior to load - -  Number falls in past yr: - 0 - - 1  Injury with Fall? - 0 - - No  Risk for fall due to : - - - - Mental status change     Depression Screen PHQ 2/9 Scores 09/25/2018 06/25/2018 08/25/2016 08/24/2015  PHQ - 2 Score 0 0 0 0  PHQ- 9 Score 0 - - -    Cognitive Function       Ad8 score reviewed for issues:  Issues making decisions: no  Less interest in hobbies / activities: no  Repeats questions, stories (family complaining): no  Trouble using ordinary gadgets (microwave, computer, phone):no  Forgets the month or year: no  Mismanaging finances: no  Remembering appts: no  Daily problems with thinking and/or memory: no Ad8 score is= 0  No memory concerns expressed by pt. Or wife at side.   Immunization History  Administered Date(s) Administered  . Influenza Split 07/07/2011, 04/19/2012  . Influenza Whole 05/13/2009, 04/20/2010  . Influenza, High Dose Seasonal PF 05/26/2015, 05/26/2016, 05/15/2017, 05/13/2018  . Influenza,inj,Quad PF,6+ Mos 05/02/2013, 05/12/2014  . Pneumococcal Conjugate-13 01/07/2014  . Pneumococcal Polysaccharide-23 07/07/2011  . Td 03/03/2009  . Zoster 01/30/2006    Qualifies for Shingles Vaccine? Yes, completed series.  Screening Tests Health Maintenance  Topic Date Due  . TETANUS/TDAP  03/04/2019  . INFLUENZA VACCINE  Completed  . PNA vac Low Risk Adult  Completed         Plan:    Bring  a copy of your living will and/or healthcare power of attorney to your next office visit.  Will follow-up with you regarding what Dr. Jerilee Hoh says about upcoming  appointment and treatment for restless leg.   Great meeting you and your wife today I have personally reviewed and noted the following in the patient's chart:   . Medical and social history . Use of alcohol, tobacco or illicit drugs  . Current medications and supplements . Functional ability and status . Nutritional status . Physical activity . Advanced directives . List of other physicians . Vitals . Screenings to include cognitive, depression, and falls . Referrals and appointments  In addition, I have reviewed and discussed with patient certain preventive protocols, quality metrics, and best practice recommendations. A written personalized care plan for preventive services as well as general preventive health recommendations were provided to patient.     Alphia Moh, RN  09/25/2018

## 2018-09-26 NOTE — Telephone Encounter (Signed)
Spoke with patient and appointment rescheduled.

## 2018-10-11 ENCOUNTER — Other Ambulatory Visit: Payer: Self-pay | Admitting: *Deleted

## 2018-10-11 ENCOUNTER — Other Ambulatory Visit: Payer: Self-pay

## 2018-10-11 ENCOUNTER — Ambulatory Visit (INDEPENDENT_AMBULATORY_CARE_PROVIDER_SITE_OTHER): Payer: Medicare Other | Admitting: Internal Medicine

## 2018-10-11 ENCOUNTER — Encounter: Payer: Self-pay | Admitting: Internal Medicine

## 2018-10-11 ENCOUNTER — Other Ambulatory Visit: Payer: Self-pay | Admitting: Internal Medicine

## 2018-10-11 VITALS — BP 120/70 | HR 55 | Temp 98.2°F | Ht 69.0 in | Wt 177.8 lb

## 2018-10-11 DIAGNOSIS — E559 Vitamin D deficiency, unspecified: Secondary | ICD-10-CM | POA: Diagnosis not present

## 2018-10-11 DIAGNOSIS — G4733 Obstructive sleep apnea (adult) (pediatric): Secondary | ICD-10-CM | POA: Diagnosis not present

## 2018-10-11 DIAGNOSIS — E039 Hypothyroidism, unspecified: Secondary | ICD-10-CM

## 2018-10-11 DIAGNOSIS — C44310 Basal cell carcinoma of skin of unspecified parts of face: Secondary | ICD-10-CM | POA: Diagnosis not present

## 2018-10-11 DIAGNOSIS — E785 Hyperlipidemia, unspecified: Secondary | ICD-10-CM | POA: Diagnosis not present

## 2018-10-11 DIAGNOSIS — I471 Supraventricular tachycardia: Secondary | ICD-10-CM

## 2018-10-11 DIAGNOSIS — Z0000 Encounter for general adult medical examination without abnormal findings: Secondary | ICD-10-CM

## 2018-10-11 LAB — COMPREHENSIVE METABOLIC PANEL
ALT: 15 U/L (ref 0–53)
AST: 18 U/L (ref 0–37)
Albumin: 4.3 g/dL (ref 3.5–5.2)
Alkaline Phosphatase: 91 U/L (ref 39–117)
BUN: 15 mg/dL (ref 6–23)
CO2: 28 mEq/L (ref 19–32)
Calcium: 9.2 mg/dL (ref 8.4–10.5)
Chloride: 100 mEq/L (ref 96–112)
Creatinine, Ser: 0.95 mg/dL (ref 0.40–1.50)
GFR: 75.94 mL/min (ref >60.00)
Glucose, Bld: 103 mg/dL — ABNORMAL HIGH (ref 70–99)
Potassium: 4.7 mEq/L (ref 3.5–5.1)
Sodium: 136 mEq/L (ref 135–145)
Total Bilirubin: 0.6 mg/dL (ref 0.2–1.2)
Total Protein: 7.1 g/dL (ref 6.0–8.3)

## 2018-10-11 LAB — CBC WITH DIFFERENTIAL/PLATELET
Basophils Absolute: 0.1 10*3/uL (ref 0.0–0.1)
Basophils Relative: 1.1 % (ref 0.0–3.0)
Eosinophils Absolute: 0.1 10*3/uL (ref 0.0–0.7)
Eosinophils Relative: 2.9 % (ref 0.0–5.0)
HCT: 42.9 % (ref 39.0–52.0)
Hemoglobin: 14.3 g/dL (ref 13.0–17.0)
Lymphocytes Relative: 30.5 % (ref 12.0–46.0)
Lymphs Abs: 1.5 10*3/uL (ref 0.7–4.0)
MCHC: 33.3 g/dL (ref 30.0–36.0)
MCV: 87 fl (ref 78.0–100.0)
Monocytes Absolute: 0.7 10*3/uL (ref 0.1–1.0)
Monocytes Relative: 14.4 % — ABNORMAL HIGH (ref 3.0–12.0)
Neutro Abs: 2.5 10*3/uL (ref 1.4–7.7)
Neutrophils Relative %: 51.1 % (ref 43.0–77.0)
Platelets: 221 10*3/uL (ref 150.0–400.0)
RBC: 4.93 Mil/uL (ref 4.22–5.81)
RDW: 14.5 % (ref 11.5–15.5)
WBC: 4.9 10*3/uL (ref 4.0–10.5)

## 2018-10-11 LAB — TSH: TSH: 0.58 u[IU]/mL (ref 0.35–4.50)

## 2018-10-11 LAB — LIPID PANEL
Cholesterol: 185 mg/dL (ref 0–200)
HDL: 49 mg/dL (ref >39.00)
LDL Cholesterol: 114 mg/dL — ABNORMAL HIGH (ref 0–99)
NonHDL: 135.95
Total CHOL/HDL Ratio: 4
Triglycerides: 109 mg/dL (ref 0.0–149.0)
VLDL: 21.8 mg/dL (ref 0.0–40.0)

## 2018-10-11 LAB — VITAMIN D 25 HYDROXY (VIT D DEFICIENCY, FRACTURES): VITD: 26.34 ng/mL — ABNORMAL LOW (ref 30.00–100.00)

## 2018-10-11 MED ORDER — VITAMIN D (ERGOCALCIFEROL) 1.25 MG (50000 UNIT) PO CAPS: 50000.0000 [IU] | ORAL_CAPSULE | ORAL | 0 refills | Status: AC

## 2018-10-11 MED ORDER — VITAMIN D (ERGOCALCIFEROL) 1.25 MG (50000 UNIT) PO CAPS: 50000.0000 [IU] | ORAL_CAPSULE | ORAL | 0 refills | Status: DC

## 2018-10-11 NOTE — Progress Notes (Signed)
Established Patient Office Visit     CC/Reason for Visit: Yearly follow-up of chronic conditions  HPI: [name] is a 84 y.o. male who is coming in today for the above mentioned reasons. Past Medical History is significant for: Obstructive sleep apnea on nightly CPAP, hypothyroidism that has been well controlled on levothyroxine, GERD without symptoms on chronic PPI therapy.  He has a few acute concerns today:  1.  He hit his left middle finger in a door and has a significant nail bruise and his finger is now pointing upwards, it is not painful.  2.  He has a very small, pearlescent nodule on his left face next to his nose that has been growing in size.  3.  He wonders  whether his CPAP machine needs to be exchanged as he has had it for over 7 years.  He is up-to-date on all vaccinations including flu, Tdap, Prevnar, Pneumovax, he recently completed shingles vaccination series.  He is up-to-date on colon cancer screening, had colonoscopy in September 2018, we have discussed current guidelines that go against routine prostate cancer screening.  He has routine eye and dental care.   Past Medical/Surgical History: Past Medical History:  Diagnosis Date  . Anemia   . GERD (gastroesophageal reflux disease)   . H/O hiatal hernia   . Headache(784.0)   . History of cystoscopy 1965  . History of hernia repair    left side:1991and 1978 right side: 2009  . Hx of benign prostatic hypertrophy   . Hx of colonic polyps    Dr. Earlean Shawl removed 2 in 2009  . Hyperlipidemia   . Hypothyroidism   . Shortness of breath    with exertion   . Sleep apnea    2008 no CPAP machine   . SVT (supraventricular tachycardia) ([place])    2002   . Thyroid disease    hypothyroidism    Past Surgical History:  Procedure Laterality Date  . bilateral hernia repair  [id number]  . borkne bones foot  84 yrs old  . broken finger  84 yrs old  . cystocopy  1965  . [place], 2009  .  LAPAROSCOPIC NISSEN FUNDOPLICATION  12/02/7320   Procedure: LAPAROSCOPIC NISSEN FUNDOPLICATION;  Surgeon: Edward Jolly, MD;  Location: [facility];  Service: General;  Laterality: N/A;  Laparoscopic Repair of Large Hiatal Hernia with Nissen Fundoplication with mesh   . pneumonia  84 years old  . skin cancer face last ck on 08-22-07    . THYROIDECTOMY  1967    Social History:  reports that he has quit smoking. He has never used smokeless tobacco. He reports current alcohol use of about 1.0 standard drinks of alcohol per week. He reports that he does not use drugs.  Allergies: No Known Allergies  Family History:  Family History  Problem Relation Age of Onset  . Cancer Mother 10       breast  . Heart disease Mother   . Dementia Mother 19  . Thyroid disease Mother   . Macular degeneration Mother   . Heart disease Father   . Hypertension Father   . Heart failure Father   . Dementia Father   . Cancer Maternal Grandmother   . Cancer Paternal Grandmother      Current Outpatient Medications:  .  acetaminophen (TYLENOL) 500 MG tablet, Take 1,000 mg by mouth every 4 (four) hours as needed. For pain, Disp: , Rfl:  .  aspirin  81 MG tablet, Take 81 mg by mouth every morning. , Disp: , Rfl:  .  diphenhydrAMINE (BENADRYL) 25 MG tablet, Take 25-50 mg by mouth at bedtime., Disp: , Rfl:  .  levothyroxine (SYNTHROID, LEVOTHROID) 125 MCG tablet, TAKE 1 TABLET BY MOUTH EVERY MORNING. GENERIC EQUIVALENT FOR FOR SYNTHROID., Disp: 90 tablet, Rfl: 4 .  metoprolol succinate (TOPROL-XL) 25 MG 24 hr tablet, Take 1 tablet (25 mg total) by mouth daily., Disp: 90 tablet, Rfl: 4 .  omeprazole (PRILOSEC) 20 MG capsule, Take 1 capsule (20 mg total) by mouth daily., Disp: 90 capsule, Rfl: 4 .  vitamin B-12 (CYANOCOBALAMIN) 1000 MCG tablet, Take 500 mcg by mouth daily. Patient states that he takes half tablet once a day, Disp: , Rfl:   Review of Systems:  Constitutional: Denies fever, chills, diaphoresis,  appetite change and fatigue.  HEENT: Denies photophobia, eye pain, redness, hearing loss, ear pain, congestion, sore throat, rhinorrhea, sneezing, mouth sores, trouble swallowing, neck pain, neck stiffness and tinnitus.   Respiratory: Denies SOB, DOE, cough, chest tightness,  and wheezing.   Cardiovascular: Denies chest pain, palpitations and leg swelling.  Gastrointestinal: Denies nausea, vomiting, abdominal pain, diarrhea, constipation, blood in stool and abdominal distention.  Genitourinary: Denies dysuria, urgency, frequency, hematuria, flank pain and difficulty urinating.  Endocrine: Denies: hot or cold intolerance, sweats, changes in hair or nails, polyuria, polydipsia. Musculoskeletal: Denies myalgias, back pain, joint swelling, arthralgias and gait problem.  Skin: Denies pallor, rash and wound.  Neurological: Denies dizziness, seizures, syncope, weakness, light-headedness, numbness and headaches.  Hematological: Denies adenopathy. Easy bruising, personal or family bleeding history  Psychiatric/Behavioral: Denies suicidal ideation, mood changes, confusion, nervousness, sleep disturbance and agitation    Physical Exam: Vitals:   10/11/18 0831  BP: 120/70  Pulse: (!) 55  Temp: 98.2 F (36.8 C)  TempSrc: Oral  SpO2: 98%  Weight: 177 lb 12.8 oz (80.6 kg)  Height: '5\' 9"'$  (1.753 m)    Body mass index is 26.26 kg/m.   Constitutional: NAD, calm, comfortable Eyes: PERRL, lids and conjunctivae normal, wears corrective lenses ENMT: Mucous membranes are moist. Posterior pharynx clear of any exudate or lesions. Normal dentition. Tympanic membrane is pearly white, no erythema or bulging.  Minimal amount of left ear cerumen. Neck: normal, supple, no masses, no thyromegaly Respiratory: clear to auscultation bilaterally, no wheezing, no crackles. Normal respiratory effort. No accessory muscle use.  Cardiovascular: Regular rate and rhythm, no murmurs / rubs / gallops. No extremity edema. 2+  pedal pulses. No carotid bruits.  Abdomen: no tenderness, no masses palpated. No hepatosplenomegaly. Bowel sounds positive.  Musculoskeletal: no clubbing / cyanosis. No joint deformity upper and lower extremities. Good ROM, no contractures. Normal muscle tone.  Skin: Small pearlescent node on the left side of his nose Neurologic: CN 2-12 grossly intact. Sensation intact, DTR normal. Strength 5/5 in all 4.  Psychiatric: Normal judgment and insight. Alert and oriented x 3. Normal mood.    Impression and Plan:  Encounter for preventive health examination -Vaccinations and cancer screenings are up-to-date. -We have discussed healthy lifestyle in detail. -Have recommended routine eye and dental care which she already has. -Labs today.  Paroxysmal supraventricular tachycardia ([place]), Chronic -Takes metoprolol, has had no issues.  Acquired hypothyroidism  -Check TSH, continue home dose of Synthroid.  Vitamin D deficiency  -Check vitamin D levels today.  Dyslipidemia  -Check lipids today, last LDL was 108 in February 2018.  OSA (obstructive sleep apnea) -Will inquire with his home health company whether  CPAP needs to be exchanged or whether he needs new sleep study.  Basal cell carcinoma (BCC) of face - Plan: Ambulatory referral to Dermatology    Patient Instructions  -It was nice seeing you today!!  -Labs today; will notify you when results are available.  -Referral to dermatology has been initiated.  -Schedule follow up in 6 months.   Preventive Care 38 Years and Older, Male Preventive care refers to lifestyle choices and visits with your health care provider that can promote health and wellness. What does preventive care include?   A yearly physical exam. This is also called an annual well check.  Dental exams once or twice a year.  Routine eye exams. Ask your health care provider how often you should have your eyes checked.  Personal lifestyle choices, including: ?  Daily care of your teeth and gums. ? Regular physical activity. ? Eating a healthy diet. ? Avoiding tobacco and drug use. ? Limiting alcohol use. ? Practicing safe sex. ? Taking low doses of aspirin every day. ? Taking vitamin and mineral supplements as recommended by your health care provider. What happens during an annual well check? The services and screenings done by your health care provider during your annual well check will depend on your age, overall health, lifestyle risk factors, and family history of disease. Counseling Your health care provider may ask you questions about your:  Alcohol use.  Tobacco use.  Drug use.  Emotional well-being.  Home and relationship well-being.  Sexual activity.  Eating habits.  History of falls.  Memory and ability to understand (cognition).  Work and work Statistician. Screening You may have the following tests or measurements:  Height, weight, and BMI.  Blood pressure.  Lipid and cholesterol levels. These may be checked every 5 years, or more frequently if you are over 28 years old.  Skin check.  Lung cancer screening. You may have this screening every year starting at age 63 if you have a 30-pack-year history of smoking and currently smoke or have quit within the past 15 years.  Colorectal cancer screening. All adults should have this screening starting at age 71 and continuing until age 49. You will have tests every 1-10 years, depending on your results and the type of screening test. People at increased risk should start screening at an earlier age. Screening tests may include: ? Guaiac-based fecal occult blood testing. ? Fecal immunochemical test (FIT). ? Stool DNA test. ? Virtual colonoscopy. ? Sigmoidoscopy. During this test, a flexible tube with a tiny camera (sigmoidoscope) is used to examine your rectum and lower colon. The sigmoidoscope is inserted through your anus into your rectum and lower colon. ? Colonoscopy.  During this test, a long, thin, flexible tube with a tiny camera (colonoscope) is used to examine your entire colon and rectum.  Prostate cancer screening. Recommendations will vary depending on your family history and other risks.  Hepatitis C blood test.  Hepatitis B blood test.  Sexually transmitted disease (STD) testing.  Diabetes screening. This is done by checking your blood sugar (glucose) after you have not eaten for a while (fasting). You may have this done every 1-3 years.  Abdominal aortic aneurysm (AAA) screening. You may need this if you are a current or former smoker.  Osteoporosis. You may be screened starting at age 44 if you are at high risk. Talk with your health care provider about your test results, treatment options, and if necessary, the need for more tests. Vaccines Your health  care provider may recommend certain vaccines, such as:  Influenza vaccine. This is recommended every year.  Tetanus, diphtheria, and acellular pertussis (Tdap, Td) vaccine. You may need a Td booster every 10 years.  Varicella vaccine. You may need this if you have not been vaccinated.  Zoster vaccine. You may need this after age 53.  Measles, mumps, and rubella (MMR) vaccine. You may need at least one dose of MMR if you were born in 1957 or later. You may also need a second dose.  Pneumococcal 13-valent conjugate (PCV13) vaccine. One dose is recommended after age 79.  Pneumococcal polysaccharide (PPSV23) vaccine. One dose is recommended after age 41.  Meningococcal vaccine. You may need this if you have certain conditions.  Hepatitis A vaccine. You may need this if you have certain conditions or if you travel or work in places where you may be exposed to hepatitis A.  Hepatitis B vaccine. You may need this if you have certain conditions or if you travel or work in places where you may be exposed to hepatitis B.  Haemophilus influenzae type b (Hib) vaccine. You may need this if you  have certain risk factors. Talk to your health care provider about which screenings and vaccines you need and how often you need them. This information is not intended to replace advice given to you by your health care provider. Make sure you discuss any questions you have with your health care provider. Document Released: 08/13/2015 Document Revised: 09/06/2017 Document Reviewed: 05/18/2015 Elsevier Interactive Patient Education  2019 [place], MD [facility]

## 2018-10-11 NOTE — Addendum Note (Signed)
Addended by: Gwynne Edinger on: 10/11/2018 09:11 AM   Modules accepted: Orders

## 2018-10-11 NOTE — Addendum Note (Signed)
Addended by: Westley Hummer B on: 10/11/2018 09:17 AM   Modules accepted: Orders

## 2018-10-11 NOTE — Progress Notes (Signed)
Medication reordered and sent to local pharmacy instead of mail order pharmacy.

## 2018-10-11 NOTE — Patient Instructions (Signed)
-It was nice seeing you today!!  -Labs today; will notify you when results are available.  -Referral to dermatology has been initiated.  -Schedule follow up in 6 months.   Preventive Care 82 Years and Older, Male Preventive care refers to lifestyle choices and visits with your health care provider that can promote health and wellness. What does preventive care include?   A yearly physical exam. This is also called an annual well check.  Dental exams once or twice a year.  Routine eye exams. Ask your health care provider how often you should have your eyes checked.  Personal lifestyle choices, including: ? Daily care of your teeth and gums. ? Regular physical activity. ? Eating a healthy diet. ? Avoiding tobacco and drug use. ? Limiting alcohol use. ? Practicing safe sex. ? Taking low doses of aspirin every day. ? Taking vitamin and mineral supplements as recommended by your health care provider. What happens during an annual well check? The services and screenings done by your health care provider during your annual well check will depend on your age, overall health, lifestyle risk factors, and family history of disease. Counseling Your health care provider may ask you questions about your:  Alcohol use.  Tobacco use.  Drug use.  Emotional well-being.  Home and relationship well-being.  Sexual activity.  Eating habits.  History of falls.  Memory and ability to understand (cognition).  Work and work Statistician. Screening You may have the following tests or measurements:  Height, weight, and BMI.  Blood pressure.  Lipid and cholesterol levels. These may be checked every 5 years, or more frequently if you are over 55 years old.  Skin check.  Lung cancer screening. You may have this screening every year starting at age 82 if you have a 30-pack-year history of smoking and currently smoke or have quit within the past 15 years.  Colorectal cancer screening.  All adults should have this screening starting at age 37 and continuing until age 82. You will have tests every 1-10 years, depending on your results and the type of screening test. People at increased risk should start screening at an earlier age. Screening tests may include: ? Guaiac-based fecal occult blood testing. ? Fecal immunochemical test (FIT). ? Stool DNA test. ? Virtual colonoscopy. ? Sigmoidoscopy. During this test, a flexible tube with a tiny camera (sigmoidoscope) is used to examine your rectum and lower colon. The sigmoidoscope is inserted through your anus into your rectum and lower colon. ? Colonoscopy. During this test, a long, thin, flexible tube with a tiny camera (colonoscope) is used to examine your entire colon and rectum.  Prostate cancer screening. Recommendations will vary depending on your family history and other risks.  Hepatitis C blood test.  Hepatitis B blood test.  Sexually transmitted disease (STD) testing.  Diabetes screening. This is done by checking your blood sugar (glucose) after you have not eaten for a while (fasting). You may have this done every 1-3 years.  Abdominal aortic aneurysm (AAA) screening. You may need this if you are a current or former smoker.  Osteoporosis. You may be screened starting at age 82 if you are at high risk. Talk with your health care provider about your test results, treatment options, and if necessary, the need for more tests. Vaccines Your health care provider may recommend certain vaccines, such as:  Influenza vaccine. This is recommended every year.  Tetanus, diphtheria, and acellular pertussis (Tdap, Td) vaccine. You may need a Td booster every  10 years.  Varicella vaccine. You may need this if you have not been vaccinated.  Zoster vaccine. You may need this after age 82.  Measles, mumps, and rubella (MMR) vaccine. You may need at least one dose of MMR if you were born in 1957 or later. You may also need a  second dose.  Pneumococcal 13-valent conjugate (PCV13) vaccine. One dose is recommended after age 82.  Pneumococcal polysaccharide (PPSV23) vaccine. One dose is recommended after age 18.  Meningococcal vaccine. You may need this if you have certain conditions.  Hepatitis A vaccine. You may need this if you have certain conditions or if you travel or work in places where you may be exposed to hepatitis A.  Hepatitis B vaccine. You may need this if you have certain conditions or if you travel or work in places where you may be exposed to hepatitis B.  Haemophilus influenzae type b (Hib) vaccine. You may need this if you have certain risk factors. Talk to your health care provider about which screenings and vaccines you need and how often you need them. This information is not intended to replace advice given to you by your health care provider. Make sure you discuss any questions you have with your health care provider. Document Released: 08/13/2015 Document Revised: 09/06/2017 Document Reviewed: 05/18/2015 Elsevier Interactive Patient Education  2019 [employer].

## 2018-10-16 ENCOUNTER — Other Ambulatory Visit: Payer: Self-pay | Admitting: Internal Medicine

## 2018-10-16 DIAGNOSIS — E559 Vitamin D deficiency, unspecified: Secondary | ICD-10-CM

## 2018-10-30 ENCOUNTER — Telehealth: Payer: Self-pay | Admitting: Internal Medicine

## 2018-10-30 NOTE — Telephone Encounter (Signed)
Faxed path report that was in the patient chart  [phone]   Copied from [place] [phone]. Topic: General - Inquiry >> Oct 30, 2018  9:51 AM Virl Axe D wrote: Reason for CRM: Lilia Pro with the [facility] stated that the referral they received for pt stated he has basal cell. They need to know if it has been biopsied so that they can schedule surgery. Please advise. [phone]

## 2018-12-05 ENCOUNTER — Other Ambulatory Visit: Payer: Self-pay

## 2018-12-05 ENCOUNTER — Ambulatory Visit (INDEPENDENT_AMBULATORY_CARE_PROVIDER_SITE_OTHER): Payer: Medicare Other | Admitting: Internal Medicine

## 2018-12-05 ENCOUNTER — Encounter: Payer: Self-pay | Admitting: Internal Medicine

## 2018-12-05 VITALS — BP 132/70 | HR 58 | Ht 68.5 in | Wt 175.4 lb

## 2018-12-05 DIAGNOSIS — G4733 Obstructive sleep apnea (adult) (pediatric): Secondary | ICD-10-CM | POA: Diagnosis not present

## 2018-12-05 DIAGNOSIS — E039 Hypothyroidism, unspecified: Secondary | ICD-10-CM | POA: Diagnosis not present

## 2018-12-05 NOTE — Patient Instructions (Signed)
Order- DME [employer]- please replace old CPAP machine, auto 5-15, mask of choice, humidifier, supplies, AirView  Please call as needed

## 2018-12-05 NOTE — Progress Notes (Signed)
5/72020-  84 yoM former smoker for sleep evaluation. Wants to establish for f/u. Medical problem list includes SVT, Syncope, Rhinitis, GERD, Hypothyroid,  -----referred by Dr. Isaac Bliss (PCP), OSA on CPAP for 7 years, last sleep study in 2008, states he uses every night, occasionally takes in off in the middle of the night. Apparently he never really established with a sleep doctor, but has felt well controlled.  NPSG 01/01/07- AHI 30/ hr, desaturation to 83% CPAP 10  / [employer] Download compliance 83%, AHI 0.7/ hr Current machine is 82 years old, used every night, but sometimes takes it off during the night.. Has SoClean. Rarely naps. Snores if w/o CPAP. Nasal pillows.  ENT- tonsils. Weight is stable.   Prior to Admission medications   Medication Sig Start Date End Date Taking? Authorizing Provider  aspirin 81 MG tablet Take 81 mg by mouth every morning.    Yes [provider]  diphenhydrAMINE (BENADRYL) 25 MG tablet Take 25-50 mg by mouth at bedtime.   Yes [provider]  levothyroxine (SYNTHROID, LEVOTHROID) 125 MCG tablet TAKE 1 TABLET BY MOUTH EVERY MORNING. GENERIC EQUIVALENT FOR FOR SYNTHROID. 03/12/18  Yes Marletta Lor, MD  metoprolol succinate (TOPROL-XL) 25 MG 24 hr tablet Take 1 tablet (25 mg total) by mouth daily. 03/12/18  Yes Marletta Lor, MD  omeprazole (PRILOSEC) 20 MG capsule Take 1 capsule (20 mg total) by mouth daily. 03/12/18  Yes Marletta Lor, MD  vitamin B-12 (CYANOCOBALAMIN) 1000 MCG tablet Take 500 mcg by mouth daily. Patient states that he takes half tablet once a day   Yes [provider]  Vitamin D, Ergocalciferol, (DRISDOL) 1.25 MG (50000 UT) CAPS capsule Take 1 capsule (50,000 Units total) by mouth every 7 (seven) days for 12 doses. 10/11/18 12/28/18 Yes Erline Hau, MD  acetaminophen (TYLENOL) 500 MG tablet Take 1,000 mg by mouth every 4 (four) hours as needed. For pain    [provider]   Past  Medical History:  Diagnosis Date  . Anemia   . GERD (gastroesophageal reflux disease)   . H/O hiatal hernia   . Headache(784.0)   . History of cystoscopy 1965  . History of hernia repair    left side:1991and 1978 right side: 2009  . Hx of benign prostatic hypertrophy   . Hx of colonic polyps    Dr. Earlean Shawl removed 2 in 2009  . Hyperlipidemia   . Hypothyroidism   . Shortness of breath    with exertion   . Sleep apnea    2008 no CPAP machine   . SVT (supraventricular tachycardia) ([place])    2002   . Thyroid disease    hypothyroidism   Past Surgical History:  Procedure Laterality Date  . bilateral hernia repair  [id number]  . borkne bones foot  84 yrs old  . broken finger  84 yrs old  . cystocopy  1965  . [place], 2009  . LAPAROSCOPIC NISSEN FUNDOPLICATION  03/31/4781   Procedure: LAPAROSCOPIC NISSEN FUNDOPLICATION;  Surgeon: Edward Jolly, MD;  Location: [facility];  Service: General;  Laterality: N/A;  Laparoscopic Repair of Large Hiatal Hernia with Nissen Fundoplication with mesh   . pneumonia  84 years old  . skin cancer face last ck on 08-22-07    . THYROIDECTOMY  1967   Family History  Problem Relation Age of Onset  . Cancer Mother 47       breast  . Heart disease  Mother   . Dementia Mother 18  . Thyroid disease Mother   . Macular degeneration Mother   . Heart disease Father   . Hypertension Father   . Heart failure Father   . Dementia Father   . Cancer Maternal Grandmother   . Cancer Paternal Grandmother    Social History   Socioeconomic History  . Marital status: Married    Spouse name: Not on file  . Number of children: 2  . Years of education: Not on file  . Highest education level: Not on file  Occupational History  . Not on file  Social Needs  . Financial resource strain: Not hard at all  . Food insecurity:    Worry: Never true    Inability: Never true  . Transportation needs:    Medical: No    Non-medical: No  Tobacco Use  .  Smoking status: Former Smoker    Packs/day: 1.00    Types: Cigarettes    Last attempt to quit: 10/29/1985    Years since quitting: 33.1  . Smokeless tobacco: Never Used  Substance and Sexual Activity  . Alcohol use: Yes    Alcohol/week: 1.0 standard drinks    Types: 1 Glasses of wine per week    Comment: 2 nites per week   . Drug use: No  . Sexual activity: Never  Lifestyle  . Physical activity:    Days per week: 0 days    Minutes per session: 0 min  . Stress: Not at all  Relationships  . Social connections:    Talks on phone: More than three times a week    Gets together: Twice a week    Attends religious service: Not on file    Active member of club or organization: Not on file    Attends meetings of clubs or organizations: Not on file    Relationship status: Married  . Intimate partner violence:    Fear of current or ex partner: No    Emotionally abused: No    Physically abused: No    Forced sexual activity: No  Other Topics Concern  . Not on file  Social History Narrative   Lives with wife in one story house.   Has one son, one daughter, 5 grandchildren and 6 GG. Very involved with family.   Enjoys walking dog, doing projects around the house, woodworking   ROS-see HPI  + = positive Constitutional:    weight loss, night sweats, fevers, chills, fatigue, lassitude. HEENT:    headaches, +difficulty swallowing, tooth/dental problems, sore throat,       +sneezing, itching, ear ache, +nasal congestion, post nasal drip, snoring CV:    chest pain, orthopnea, PND, swelling in lower extremities, anasarca,                                  dizziness, +palpitations Resp:   shortness of breath with exertion or at rest.                productive cough,   +non-productive cough, coughing up of blood.              change in color of mucus.  wheezing.   Skin:    rash or lesions. GI:  + heartburn, indigestion, abdominal pain, nausea, vomiting, diarrhea,                 change in bowel  habits,  loss of appetite GU: dysuria, change in color of urine, no urgency or frequency.   flank pain. MS:   joint pain, stiffness, decreased range of motion, back pain. Neuro-     nothing unusual Psych:  change in mood or affect.  depression or anxiety.   memory loss.  OBJ- Physical Exam General- Alert, Oriented, Affect-appropriate, Distress- none acute Skin- rash-none, lesions- none, excoriation- none Lymphadenopathy- none Head- atraumatic            Eyes- Gross vision intact, PERRLA, conjunctivae and secretions clear            Ears- Hearing, canals-normal            Nose- Clear, no-Septal dev, mucus, polyps, erosion, perforation             Throat- Mallampati III , mucosa clear , drainage- none, tonsils- atrophic Neck- flexible , trachea midline, no stridor , thyroid nl, carotid no bruit Chest - symmetrical excursion , unlabored           Heart/CV- RRR , no murmur , no gallop  , no rub, nl s1 s2                           - JVD- none , edema- none, stasis changes- none, varices- none           Lung- clear to P&A, wheeze- none, cough- none , dullness-none, rub- none           Chest wall-  Abd-  Br/ Gen/ Rectal- Not done, not indicated Extrem- cyanosis- none, clubbing, none, atrophy- none, strength- nl Neuro- grossly intact to observation

## 2018-12-16 NOTE — Assessment & Plan Note (Signed)
He feels well controlled and denies lack of energy or other symptoms that might overlap with sleep complaints.

## 2018-12-16 NOTE — Assessment & Plan Note (Signed)
He has benefited from CPAP, and download confirms good compliance and control. His machine is getting old and I recommended replacement, keeping the old machine as a back up. Plan- Replace old machine, changing to auto 5-15, with mask of choice, humidifier, supplies, AirView or card.

## 2018-12-24 ENCOUNTER — Ambulatory Visit: Payer: Medicare Other | Admitting: Internal Medicine

## 2019-01-06 ENCOUNTER — Other Ambulatory Visit: Payer: Self-pay

## 2019-01-06 ENCOUNTER — Other Ambulatory Visit (INDEPENDENT_AMBULATORY_CARE_PROVIDER_SITE_OTHER): Payer: Medicare Other

## 2019-01-06 DIAGNOSIS — E559 Vitamin D deficiency, unspecified: Secondary | ICD-10-CM

## 2019-01-06 LAB — VITAMIN D 25 HYDROXY (VIT D DEFICIENCY, FRACTURES): VITD: 54.76 ng/mL (ref 30.00–100.00)

## 2019-01-20 DIAGNOSIS — H353211 Exudative age-related macular degeneration, right eye, with active choroidal neovascularization: Secondary | ICD-10-CM | POA: Diagnosis not present

## 2019-02-24 DIAGNOSIS — H353211 Exudative age-related macular degeneration, right eye, with active choroidal neovascularization: Secondary | ICD-10-CM | POA: Diagnosis not present

## 2019-03-27 DIAGNOSIS — Z961 Presence of intraocular lens: Secondary | ICD-10-CM | POA: Diagnosis not present

## 2019-03-27 DIAGNOSIS — H43813 Vitreous degeneration, bilateral: Secondary | ICD-10-CM | POA: Diagnosis not present

## 2019-03-27 DIAGNOSIS — H353211 Exudative age-related macular degeneration, right eye, with active choroidal neovascularization: Secondary | ICD-10-CM | POA: Diagnosis not present

## 2019-03-27 DIAGNOSIS — H353122 Nonexudative age-related macular degeneration, left eye, intermediate dry stage: Secondary | ICD-10-CM | POA: Diagnosis not present

## 2019-04-01 DIAGNOSIS — H353211 Exudative age-related macular degeneration, right eye, with active choroidal neovascularization: Secondary | ICD-10-CM | POA: Diagnosis not present

## 2019-04-15 ENCOUNTER — Ambulatory Visit: Payer: Medicare Other | Admitting: Internal Medicine

## 2019-04-16 ENCOUNTER — Ambulatory Visit (INDEPENDENT_AMBULATORY_CARE_PROVIDER_SITE_OTHER): Payer: Medicare Other | Admitting: Internal Medicine

## 2019-04-16 ENCOUNTER — Other Ambulatory Visit: Payer: Self-pay

## 2019-04-16 ENCOUNTER — Encounter: Payer: Self-pay | Admitting: Internal Medicine

## 2019-04-16 VITALS — BP 110/70 | HR 64 | Temp 97.3°F | Wt 173.4 lb

## 2019-04-16 DIAGNOSIS — G4733 Obstructive sleep apnea (adult) (pediatric): Secondary | ICD-10-CM

## 2019-04-16 DIAGNOSIS — K219 Gastro-esophageal reflux disease without esophagitis: Secondary | ICD-10-CM | POA: Diagnosis not present

## 2019-04-16 DIAGNOSIS — Z23 Encounter for immunization: Secondary | ICD-10-CM

## 2019-04-16 DIAGNOSIS — E559 Vitamin D deficiency, unspecified: Secondary | ICD-10-CM | POA: Diagnosis not present

## 2019-04-16 DIAGNOSIS — E039 Hypothyroidism, unspecified: Secondary | ICD-10-CM

## 2019-04-16 DIAGNOSIS — E785 Hyperlipidemia, unspecified: Secondary | ICD-10-CM

## 2019-04-16 MED ORDER — PANTOPRAZOLE SODIUM 40 MG PO TBEC: 40.0000 mg | DELAYED_RELEASE_TABLET | Freq: Every day | ORAL | 1 refills | Status: DC

## 2019-04-16 NOTE — Progress Notes (Signed)
Established Patient Office Visit     CC/Reason for Visit: 86-month follow-up  HPI: [name] is a 84 y.o. male who is coming in today for the above mentioned reasons. Past Medical History is significant for: Obstructive sleep apnea on nightly CPAP, hypothyroidism that has been well controlled on levothyroxine, GERD on chronic PPI therapy.  He has recently started noticing increased reflux symptoms and constant throat clearing especially after meals.  We had placed a referral to dermatology due to a small lesion on the left side of his nose in March, he has not heard back from them.   Past Medical/Surgical History: Past Medical History:  Diagnosis Date  . Anemia   . GERD (gastroesophageal reflux disease)   . H/O hiatal hernia   . Headache(784.0)   . History of cystoscopy 1965  . History of hernia repair    left side:1991and 1978 right side: 2009  . Hx of benign prostatic hypertrophy   . Hx of colonic polyps    Dr. Earlean Shawl removed 2 in 2009  . Hyperlipidemia   . Hypothyroidism   . Shortness of breath    with exertion   . Sleep apnea    2008 no CPAP machine   . SVT (supraventricular tachycardia) ([place])    2002   . Thyroid disease    hypothyroidism    Past Surgical History:  Procedure Laterality Date  . bilateral hernia repair  [id number]  . borkne bones foot  84 yrs old  . broken finger  84 yrs old  . cystocopy  1965  . [place], 2009  . LAPAROSCOPIC NISSEN FUNDOPLICATION  [id number]   Procedure: LAPAROSCOPIC NISSEN FUNDOPLICATION;  Surgeon: Edward Jolly, MD;  Location: [facility];  Service: General;  Laterality: N/A;  Laparoscopic Repair of Large Hiatal Hernia with Nissen Fundoplication with mesh   . pneumonia  84 years old  . skin cancer face last ck on 08-22-07    . THYROIDECTOMY  1967    Social History:  reports that he quit smoking about 33 years ago. His smoking use included cigarettes. He smoked 1.00 pack per day. He has never used  smokeless tobacco. He reports current alcohol use of about 1.0 standard drinks of alcohol per week. He reports that he does not use drugs.  Allergies: No Known Allergies  Family History:  Family History  Problem Relation Age of Onset  . Cancer Mother 26       breast  . Heart disease Mother   . Dementia Mother 56  . Thyroid disease Mother   . Macular degeneration Mother   . Heart disease Father   . Hypertension Father   . Heart failure Father   . Dementia Father   . Cancer Maternal Grandmother   . Cancer Paternal Grandmother      Current Outpatient Medications:  .  acetaminophen (TYLENOL) 500 MG tablet, Take 1,000 mg by mouth every 4 (four) hours as needed. For pain, Disp: , Rfl:  .  aspirin 81 MG tablet, Take 81 mg by mouth every morning. , Disp: , Rfl:  .  diphenhydrAMINE (BENADRYL) 25 MG tablet, Take 25-50 mg by mouth at bedtime., Disp: , Rfl:  .  levothyroxine (SYNTHROID, LEVOTHROID) 125 MCG tablet, TAKE 1 TABLET BY MOUTH EVERY MORNING. GENERIC EQUIVALENT FOR FOR SYNTHROID., Disp: 90 tablet, Rfl: 4 .  metoprolol succinate (TOPROL-XL) 25 MG 24 hr tablet, Take 1 tablet (25 mg total) by mouth daily., Disp: 90 tablet, Rfl:  4 .  vitamin B-12 (CYANOCOBALAMIN) 1000 MCG tablet, Take 500 mcg by mouth daily. Patient states that he takes half tablet once a day, Disp: , Rfl:  .  pantoprazole (PROTONIX) 40 MG tablet, Take 1 tablet (40 mg total) by mouth daily., Disp: 90 tablet, Rfl: 1  Review of Systems:  Constitutional: Denies fever, chills, diaphoresis, appetite change and fatigue.  HEENT: Denies photophobia, eye pain, redness, hearing loss, ear pain, congestion, sore throat, rhinorrhea, sneezing, mouth sores, trouble swallowing, neck pain, neck stiffness and tinnitus.   Respiratory: Denies SOB, DOE, cough, chest tightness,  and wheezing.   Cardiovascular: Denies chest pain, palpitations and leg swelling.  Gastrointestinal: Denies nausea, vomiting, abdominal pain, diarrhea, constipation,  blood in stool and abdominal distention.  Genitourinary: Denies dysuria, urgency, frequency, hematuria, flank pain and difficulty urinating.  Endocrine: Denies: hot or cold intolerance, sweats, changes in hair or nails, polyuria, polydipsia. Musculoskeletal: Denies myalgias, back pain, joint swelling, arthralgias and gait problem.  Skin: Denies pallor, rash and wound.  Neurological: Denies dizziness, seizures, syncope, weakness, light-headedness, numbness and headaches.  Hematological: Denies adenopathy. Easy bruising, personal or family bleeding history  Psychiatric/Behavioral: Denies suicidal ideation, mood changes, confusion, nervousness, sleep disturbance and agitation    Physical Exam: Vitals:   04/16/19 1043  BP: 110/70  Pulse: 64  Temp: (!) 97.3 F (36.3 C)  TempSrc: Temporal  SpO2: 95%  Weight: 173 lb 6.4 oz (78.7 kg)    Body mass index is 25.98 kg/m.   Constitutional: NAD, calm, comfortable Eyes: PERRL, lids and conjunctivae normal, wears corrective lenses ENMT: Mucous membranes are moist.  Respiratory: clear to auscultation bilaterally, no wheezing, no crackles. Normal respiratory effort. No accessory muscle use.  Cardiovascular: Regular rate and rhythm, no murmurs / rubs / gallops. No extremity edema. 2+ pedal pulses. No carotid bruits.  Abdomen: no tenderness, no masses palpated. No hepatosplenomegaly. Bowel sounds positive.  Musculoskeletal: no clubbing / cyanosis. No joint deformity upper and lower extremities. Good ROM, no contractures. Normal muscle tone.  Skin: no rashes, lesions, ulcers. No induration Neurologic: Grossly intact and nonfocal  Psychiatric: Normal judgment and insight. Alert and oriented x 3. Normal mood.    Impression and Plan:  Acquired hypothyroidism -Last TSH was 0.580 in March 2020.  He remains on levothyroxine supplementation.  Vitamin D deficiency -Diagnosed in March, he completed 12 weeks of high-dose vitamin D with resultant level  of 56. -Have advised at least 800 international units of daily vitamin D with 1200 mg of calcium.  Dyslipidemia -Last LDL was 114 in March 2020, not on statin.  Gastroesophageal reflux disease without esophagitis -Suspect his symptoms are likely related to uncontrolled GERD.  Increase omeprazole from 20 to 40 mg daily.  Prescription sent.  OSA (obstructive sleep apnea) -Is seeing Dr. Annamaria Boots, recently received a new CPAP machine which he uses nightly.  Follow-up on dermatology referral for presumed basal cell carcinoma.   Patient Instructions  -Nice seeing you today!!  -INCREASE omeprazole from 20 to 40 mg daily, new prescription sent.  -Will follow up on dermatology referral. Please let us know if you don't hear back within 2 weeks.  -Schedule follow up in 6 months for your physical.      Lelon Frohlich, MD [facility]

## 2019-04-16 NOTE — Patient Instructions (Signed)
-  Nice seeing you today!!  -INCREASE omeprazole from 20 to 40 mg daily, new prescription sent.  -Will follow up on dermatology referral. Please let us know if you don't hear back within 2 weeks.  -Schedule follow up in 6 months for your physical.

## 2019-05-06 DIAGNOSIS — H353211 Exudative age-related macular degeneration, right eye, with active choroidal neovascularization: Secondary | ICD-10-CM | POA: Diagnosis not present

## 2019-05-09 DIAGNOSIS — Z1283 Encounter for screening for malignant neoplasm of skin: Secondary | ICD-10-CM | POA: Diagnosis not present

## 2019-05-09 DIAGNOSIS — L821 Other seborrheic keratosis: Secondary | ICD-10-CM | POA: Diagnosis not present

## 2019-05-09 DIAGNOSIS — Z85828 Personal history of other malignant neoplasm of skin: Secondary | ICD-10-CM | POA: Diagnosis not present

## 2019-05-09 DIAGNOSIS — L570 Actinic keratosis: Secondary | ICD-10-CM | POA: Diagnosis not present

## 2019-05-09 DIAGNOSIS — Z7189 Other specified counseling: Secondary | ICD-10-CM | POA: Diagnosis not present

## 2019-05-12 ENCOUNTER — Other Ambulatory Visit: Payer: Self-pay | Admitting: Internal Medicine

## 2019-05-12 DIAGNOSIS — I471 Supraventricular tachycardia: Secondary | ICD-10-CM

## 2019-05-12 DIAGNOSIS — E039 Hypothyroidism, unspecified: Secondary | ICD-10-CM

## 2019-05-12 DIAGNOSIS — K219 Gastro-esophageal reflux disease without esophagitis: Secondary | ICD-10-CM

## 2019-05-12 MED ORDER — LEVOTHYROXINE SODIUM 125 MCG PO TABS: ORAL_TABLET | ORAL | 4 refills | Status: DC

## 2019-05-12 MED ORDER — METOPROLOL SUCCINATE ER 25 MG PO TB24: 25.0000 mg | ORAL_TABLET | Freq: Every day | ORAL | 4 refills | Status: DC

## 2019-05-12 MED ORDER — PANTOPRAZOLE SODIUM 40 MG PO TBEC: 40.0000 mg | DELAYED_RELEASE_TABLET | Freq: Every day | ORAL | 4 refills | Status: DC

## 2019-05-12 NOTE — Telephone Encounter (Signed)
Requested medication (s) are due for refill today: yes  Requested medication (s) are on the active medication list: yes  Last refill:  03/12/2018  Future visit scheduled: yes  Notes to clinic:  Last filled by different provider  Review for refill   Requested Prescriptions  Pending Prescriptions Disp Refills   metoprolol succinate (TOPROL-XL) 25 MG 24 hr tablet 90 tablet 4    Sig: Take 1 tablet (25 mg total) by mouth daily.     Cardiovascular:  Beta Blockers Passed - 05/12/2019 10:15 AM      Passed - Last BP in normal range    BP Readings from Last 1 Encounters:  04/16/19 110/70         Passed - Last Heart Rate in normal range    Pulse Readings from Last 1 Encounters:  04/16/19 64         Passed - Valid encounter within last 6 months    Recent Outpatient Visits          3 weeks ago Acquired hypothyroidism   [place] at [place], MD   7 months ago Encounter for preventive health examination   [facility], Rayford Halsted, MD   10 months ago Cough   [place] at [facility], Alinda Sierras, MD   10 months ago Acquired hypothyroidism   [facility] at [place], MD   1 year ago Hypothyroidism, unspecified type   [facility], Doretha Sou, MD      Future Appointments            In 5 months Isaac Bliss, Rayford Halsted, MD [place] [facility] [place], [facility]   In 6 months Deneise Lever, MD [facility]            levothyroxine (SYNTHROID) 125 MCG tablet 90 tablet 4    Sig: TAKE 1 TABLET BY MOUTH EVERY MORNING. GENERIC EQUIVALENT FOR FOR SYNTHROID.     Endocrinology:  Hypothyroid Agents Failed - 05/12/2019 10:15 AM      Failed - TSH needs to be rechecked within 3 months after an abnormal result. Refill until TSH is due.      Passed - TSH in normal range and within 360 days    TSH  Date Value Ref Range Status   10/11/2018 0.58 0.35 - 4.50 uIU/mL Final         Passed - Valid encounter within last 12 months    Recent Outpatient Visits          3 weeks ago Acquired hypothyroidism   [place] at [facility], Rayford Halsted, MD   7 months ago Encounter for preventive health examination   [facility] at [place], MD   10 months ago Cough   [place] at [facility], Alinda Sierras, MD   10 months ago Acquired hypothyroidism   Therapist, [facility] at [place], MD   1 year ago Hypothyroidism, unspecified type   Therap[facility] at [facility], Doretha Sou, MD      Future Appointments            In 5 months Isaac Bliss, Rayford Halsted, MD [place] [facility] [place], [facility]   In 6 months Deneise Lever, MD [facility]            pantoprazole (PROTONIX) 40 MG tablet 90 tablet 1    Sig: Take  1 tablet (40 mg total) by mouth daily.     Gastroenterology: Proton Pump Inhibitors Passed - 05/12/2019 10:15 AM      Passed - Valid encounter within last 12 months    Recent Outpatient Visits          3 weeks ago Acquired hypothyroidism   [place] at [place], MD   7 months ago Encounter for preventive health examination   [facility] at [facility], Rayford Halsted, MD   10 months ago Cough   [place] at [facility], Alinda Sierras, MD   10 months ago Acquired hypothyroidism   Therapist[facility] at [place], MD   1 year ago Hypothyroidism, unspecified type   Therapist, [facility] at [facility], Doretha Sou, MD      Future Appointments            In 5 months Isaac Bliss, Rayford Halsted, MD [place] at [place], [place]   In 6 months Annamaria Boots, Kasandra Knudsen, MD [facility]

## 2019-05-12 NOTE — Telephone Encounter (Signed)
Pt called and is requesting to have medications refilled. Pt states the pharmacy will not give him a refill. Pt is requesting to have levothyroxine, metoporlol refilled. Pt also states he is needing the omeprazole to be changed to 40mg . Please advise.    [facility] - [place], [place] - [address] [place] [address]  Phone: [phone] Fax: [phone]  Not a 24 hour pharmacy; exact hours not known.

## 2019-05-13 ENCOUNTER — Ambulatory Visit: Payer: Medicare Other

## 2019-06-10 DIAGNOSIS — H353211 Exudative age-related macular degeneration, right eye, with active choroidal neovascularization: Secondary | ICD-10-CM | POA: Diagnosis not present

## 2019-08-26 DIAGNOSIS — H353211 Exudative age-related macular degeneration, right eye, with active choroidal neovascularization: Secondary | ICD-10-CM | POA: Diagnosis not present

## 2019-09-30 DIAGNOSIS — H353211 Exudative age-related macular degeneration, right eye, with active choroidal neovascularization: Secondary | ICD-10-CM | POA: Diagnosis not present

## 2019-10-14 ENCOUNTER — Encounter: Payer: Medicare Other | Admitting: Internal Medicine

## 2019-11-04 DIAGNOSIS — H353211 Exudative age-related macular degeneration, right eye, with active choroidal neovascularization: Secondary | ICD-10-CM | POA: Diagnosis not present

## 2019-11-12 ENCOUNTER — Other Ambulatory Visit: Payer: Self-pay

## 2019-11-13 ENCOUNTER — Encounter: Payer: Self-pay | Admitting: Internal Medicine

## 2019-11-13 ENCOUNTER — Ambulatory Visit (INDEPENDENT_AMBULATORY_CARE_PROVIDER_SITE_OTHER): Payer: Medicare Other | Admitting: Internal Medicine

## 2019-11-13 VITALS — BP 110/70 | HR 54 | Temp 97.5°F | Ht 69.0 in | Wt 175.5 lb

## 2019-11-13 DIAGNOSIS — E559 Vitamin D deficiency, unspecified: Secondary | ICD-10-CM | POA: Diagnosis not present

## 2019-11-13 DIAGNOSIS — H6121 Impacted cerumen, right ear: Secondary | ICD-10-CM | POA: Diagnosis not present

## 2019-11-13 DIAGNOSIS — K219 Gastro-esophageal reflux disease without esophagitis: Secondary | ICD-10-CM

## 2019-11-13 DIAGNOSIS — Z0000 Encounter for general adult medical examination without abnormal findings: Secondary | ICD-10-CM | POA: Diagnosis not present

## 2019-11-13 DIAGNOSIS — E785 Hyperlipidemia, unspecified: Secondary | ICD-10-CM

## 2019-11-13 DIAGNOSIS — Z125 Encounter for screening for malignant neoplasm of prostate: Secondary | ICD-10-CM

## 2019-11-13 DIAGNOSIS — E039 Hypothyroidism, unspecified: Secondary | ICD-10-CM

## 2019-11-13 LAB — CBC WITH DIFFERENTIAL/PLATELET
Basophils Absolute: 0.1 10*3/uL (ref 0.0–0.1)
Basophils Relative: 1.7 % (ref 0.0–3.0)
Eosinophils Absolute: 0.1 10*3/uL (ref 0.0–0.7)
Eosinophils Relative: 3 % (ref 0.0–5.0)
HCT: 41.9 % (ref 39.0–52.0)
Hemoglobin: 14 g/dL (ref 13.0–17.0)
Lymphocytes Relative: 27.3 % (ref 12.0–46.0)
Lymphs Abs: 1.2 10*3/uL (ref 0.7–4.0)
MCHC: 33.3 g/dL (ref 30.0–36.0)
MCV: 87 fl (ref 78.0–100.0)
Monocytes Absolute: 0.6 10*3/uL (ref 0.1–1.0)
Monocytes Relative: 14.3 % — ABNORMAL HIGH (ref 3.0–12.0)
Neutro Abs: 2.3 10*3/uL (ref 1.4–7.7)
Neutrophils Relative %: 53.7 % (ref 43.0–77.0)
Platelets: 197 10*3/uL (ref 150.0–400.0)
RBC: 4.82 Mil/uL (ref 4.22–5.81)
RDW: 14.2 % (ref 11.5–15.5)
WBC: 4.4 10*3/uL (ref 4.0–10.5)

## 2019-11-13 LAB — COMPREHENSIVE METABOLIC PANEL
ALT: 15 U/L (ref 0–53)
AST: 18 U/L (ref 0–37)
Albumin: 4.2 g/dL (ref 3.5–5.2)
Alkaline Phosphatase: 64 U/L (ref 39–117)
BUN: 18 mg/dL (ref 6–23)
CO2: 28 mEq/L (ref 19–32)
Calcium: 9.4 mg/dL (ref 8.4–10.5)
Chloride: 99 mEq/L (ref 96–112)
Creatinine, Ser: 1.12 mg/dL (ref 0.40–1.50)
GFR: 62.64 mL/min (ref >60.00)
Glucose, Bld: 105 mg/dL — ABNORMAL HIGH (ref 70–99)
Potassium: 4.3 mEq/L (ref 3.5–5.1)
Sodium: 134 mEq/L — ABNORMAL LOW (ref 135–145)
Total Bilirubin: 0.7 mg/dL (ref 0.2–1.2)
Total Protein: 6.8 g/dL (ref 6.0–8.3)

## 2019-11-13 LAB — LIPID PANEL
Cholesterol: 181 mg/dL (ref 0–200)
HDL: 47.7 mg/dL (ref >39.00)
LDL Cholesterol: 112 mg/dL — ABNORMAL HIGH (ref 0–99)
NonHDL: 133
Total CHOL/HDL Ratio: 4
Triglycerides: 103 mg/dL (ref 0.0–149.0)
VLDL: 20.6 mg/dL (ref 0.0–40.0)

## 2019-11-13 LAB — VITAMIN D 25 HYDROXY (VIT D DEFICIENCY, FRACTURES): VITD: 37.03 ng/mL (ref 30.00–100.00)

## 2019-11-13 LAB — TSH: TSH: 1.53 u[IU]/mL (ref 0.35–4.50)

## 2019-11-13 LAB — PSA: PSA: 0.69 ng/mL (ref 0.10–4.00)

## 2019-11-13 NOTE — Progress Notes (Addendum)
Established Patient Office Visit     This visit occurred during the SARS-CoV-2 public health emergency.  Safety protocols were in place, including screening questions prior to the visit, additional usage of staff PPE, and extensive cleaning of exam room while observing appropriate contact time as indicated for disinfecting solutions.    CC/Reason for Visit: Follow-up chronic medical conditions and subsequent Medicare wellness visit  HPI: [name] is a 85 y.o. male who is coming in today for the above mentioned reasons. Past Medical History is significant for: Obstructive sleep apnea on CPAP, hypothyroidism, GERD.  He has no acute complaints today, he has been doing well.  He has routine eye and dental care.  He has wet macular degeneration and has been undergoing intravitreal shots with his ophthalmologist.  He has no perceived issues with hearing, he exercises every day with walking and yard work.  He has received both Covid vaccines, all of his vaccines are up-to-date.  He had a colonoscopy in 2018.   Past Medical/Surgical History: Past Medical History:  Diagnosis Date  . Anemia   . GERD (gastroesophageal reflux disease)   . H/O hiatal hernia   . Headache(784.0)   . History of cystoscopy 1965  . History of hernia repair    left side:1991and 1978 right side: 2009  . Hx of benign prostatic hypertrophy   . Hx of colonic polyps    Dr. Earlean Shawl removed 2 in 2009  . Hyperlipidemia   . Hypothyroidism   . Shortness of breath    with exertion   . Sleep apnea    2008 no CPAP machine   . SVT (supraventricular tachycardia) ([place])    2002   . Thyroid disease    hypothyroidism    Past Surgical History:  Procedure Laterality Date  . bilateral hernia repair  [id number]  . borkne bones foot  85 yrs old  . broken finger  85 yrs old  . cystocopy  1965  . [place], 2009  . LAPAROSCOPIC NISSEN FUNDOPLICATION  12/30/8364   Procedure: LAPAROSCOPIC NISSEN FUNDOPLICATION;   Surgeon: Edward Jolly, MD;  Location: [facility];  Service: General;  Laterality: N/A;  Laparoscopic Repair of Large Hiatal Hernia with Nissen Fundoplication with mesh   . pneumonia  85 years old  . skin cancer face last ck on 08-22-07    . THYROIDECTOMY  1967    Social History:  reports that he quit smoking about 34 years ago. His smoking use included cigarettes. He smoked 1.00 pack per day. He has never used smokeless tobacco. He reports current alcohol use of about 1.0 standard drinks of alcohol per week. He reports that he does not use drugs.  Allergies: No Known Allergies  Family History:  Family History  Problem Relation Age of Onset  . Cancer Mother 51       breast  . Heart disease Mother   . Dementia Mother 102  . Thyroid disease Mother   . Macular degeneration Mother   . Heart disease Father   . Hypertension Father   . Heart failure Father   . Dementia Father   . Cancer Maternal Grandmother   . Cancer Paternal Grandmother      Current Outpatient Medications:  .  acetaminophen (TYLENOL) 500 MG tablet, Take 1,000 mg by mouth every 4 (four) hours as needed. For pain, Disp: , Rfl:  .  aspirin 81 MG tablet, Take 81 mg by mouth every morning. , Disp: ,  Rfl:  .  diphenhydrAMINE (BENADRYL) 25 MG tablet, Take 25-50 mg by mouth at bedtime., Disp: , Rfl:  .  levothyroxine (SYNTHROID) 125 MCG tablet, TAKE 1 TABLET BY MOUTH EVERY MORNING. GENERIC EQUIVALENT FOR FOR SYNTHROID., Disp: 90 tablet, Rfl: 4 .  metoprolol succinate (TOPROL-XL) 25 MG 24 hr tablet, Take 1 tablet (25 mg total) by mouth daily., Disp: 90 tablet, Rfl: 4 .  pantoprazole (PROTONIX) 40 MG tablet, Take 1 tablet (40 mg total) by mouth daily., Disp: 90 tablet, Rfl: 4 .  vitamin B-12 (CYANOCOBALAMIN) 1000 MCG tablet, Take 500 mcg by mouth daily. Patient states that he takes half tablet once a day, Disp: , Rfl:   Review of Systems:  Constitutional: Denies fever, chills, diaphoresis, appetite change and fatigue.    HEENT: Denies photophobia, eye pain, redness, hearing loss, ear pain, congestion, sore throat, rhinorrhea, sneezing, mouth sores, trouble swallowing, neck pain, neck stiffness and tinnitus.   Respiratory: Denies SOB, DOE, cough, chest tightness,  and wheezing.   Cardiovascular: Denies chest pain, palpitations and leg swelling.  Gastrointestinal: Denies nausea, vomiting, abdominal pain, diarrhea, constipation, blood in stool and abdominal distention.  Genitourinary: Denies dysuria, urgency, frequency, hematuria, flank pain and difficulty urinating.  Endocrine: Denies: hot or cold intolerance, sweats, changes in hair or nails, polyuria, polydipsia. Musculoskeletal: Denies myalgias, back pain, joint swelling, arthralgias and gait problem.  Skin: Denies pallor, rash and wound.  Neurological: Denies dizziness, seizures, syncope, weakness, light-headedness, numbness and headaches.  Hematological: Denies adenopathy. Easy bruising, personal or family bleeding history  Psychiatric/Behavioral: Denies suicidal ideation, mood changes, confusion, nervousness, sleep disturbance and agitation    Physical Exam: Vitals:   11/13/19 0727  BP: 110/70  Pulse: (!) 54  Temp: (!) 97.5 F (36.4 C)  TempSrc: Temporal  SpO2: 98%  Weight: 175 lb 8 oz (79.6 kg)  Height: '5\' 9"'$  (1.753 m)    Body mass index is 25.92 kg/m.   Constitutional: NAD, calm, comfortable Eyes: PERRL, lids and conjunctivae normal ENMT: Mucous membranes are moist.  Tympanic membrane is pearly white, no erythema or bulging on the left, right is obstructed by cerumen. Neck: normal, supple, no masses, no thyromegaly Respiratory: clear to auscultation bilaterally, no wheezing, no crackles. Normal respiratory effort. No accessory muscle use.  Cardiovascular: Regular rate and rhythm, no murmurs / rubs / gallops. No extremity edema. 2+ pedal pulses. No carotid bruits.  Abdomen: no tenderness, no masses palpated. No hepatosplenomegaly. Bowel  sounds positive.  Musculoskeletal: no clubbing / cyanosis. No joint deformity upper and lower extremities. Good ROM, no contractures. Normal muscle tone.  Skin: no rashes, lesions, ulcers. No induration Neurologic: CN 2-12 grossly intact. Sensation intact, DTR normal. Strength 5/5 in all 4.  Psychiatric: Normal judgment and insight. Alert and oriented x 3. Normal mood.    Subsequent Medicare wellness visit   1. Risk factors, based on past  M,S,F -cardiovascular disease risk factors include age, gender, history of hypertension   2.  Physical activities: Walks and does yard work every day   3.  Depression/mood:  Stable, not depressed   4.  Hearing:  No perceived issues   5.  ADL's: Independent in all ADLs   6.  Fall risk:  Low fall risk   7.  Home safety: No problems identified   8.  Height weight, and visual acuity: Height and weight as above, visual acuity is 20/40 with the left eye, 20/100 on the right eye, 20/40 with eyes together   9.  Counseling:  Advised  to continue 30 to 45 minutes of exercise 3 times a week   10. Lab orders based on risk factors: Laboratory update will be reviewed   11. Referral :  None today   12. Care plan:  Follow-up with me in 6 months   13. Cognitive assessment:  No cognitive impairment   14. Screening: Patient provided with a written and personalized 5-10 year screening schedule in the AVS.   yes   15. Provider List Update:   PCP, ophthalmologist  16. Advance Directives: Full code     Office Visit from 11/13/2019 in [facility]  PHQ-9 Total Score  0      Fall Risk  11/13/2019 10/11/2018 09/25/2018 06/25/2018 02/27/2018  Falls in the past year? 0 0 0 0 No  Comment - - - - Emmi Telephone Survey: data to providers prior to load  Number falls in past yr: 0 0 - 0 -  Injury with Fall? 0 0 - 0 -  Risk for fall due to : - - - - -     Impression and Plan:  Encounter for preventive health examination  -He has routine eye  and dental care. -All vaccinations are up-to-date including Covid x2. -Screening labs today. -Healthy lifestyle has been discussed in detail. -PSA today for prostate cancer screening. -He had a colonoscopy in 2018 and is a 5-year callback, he has elected to continue to pursue colon cancer screening due to personal and family risk factors.  Acquired hypothyroidism  - Plan: TSH -Last TSH was 0.580 in March 2020. -Continue levothyroxine.  Dyslipidemia  -Last LDL was 114 in March 2020. -He is not on statins. -Check lipids today.  Vitamin D deficiency  - Plan: VITAMIN D 25 Hydroxy (Vit-D Deficiency, Fractures)  Gastroesophageal reflux disease without esophagitis -Well-controlled on PPI therapy.  Right ear impacted cerumen -Cerumen Desimpaction  After patient consent was obtained, warm water was applied and gentle ear lavage performed on right ear. There were no complications and following the desimpaction the tympanic membranes were visible. Tympanic membranes are intact following the procedure. Auditory canals are normal. The patient reported relief of symptoms after removal of cerumen.     Patient Instructions  -Nice seeing you today!!  -Lab work today; will notify you once results are available.  -See you back in 6 months for follow up.   Preventive Care 34 Years and Older, Male Preventive care refers to lifestyle choices and visits with your health care provider that can promote health and wellness. This includes:  A yearly physical exam. This is also called an annual well check.  Regular dental and eye exams.  Immunizations.  Screening for certain conditions.  Healthy lifestyle choices, such as diet and exercise. What can I expect for my preventive care visit? Physical exam Your health care provider will check:  Height and weight. These may be used to calculate body mass index (BMI), which is a measurement that tells if you are at a healthy weight.  Heart rate  and blood pressure.  Your skin for abnormal spots. Counseling Your health care provider may ask you questions about:  Alcohol, tobacco, and drug use.  Emotional well-being.  Home and relationship well-being.  Sexual activity.  Eating habits.  History of falls.  Memory and ability to understand (cognition).  Work and work Statistician. What immunizations do I need?  Influenza (flu) vaccine  This is recommended every year. Tetanus, diphtheria, and pertussis (Tdap) vaccine  You may need a Td booster  every 10 years. Varicella (chickenpox) vaccine  You may need this vaccine if you have not already been vaccinated. Zoster (shingles) vaccine  You may need this after age 39. Pneumococcal conjugate (PCV13) vaccine  One dose is recommended after age 100. Pneumococcal polysaccharide (PPSV23) vaccine  One dose is recommended after age 30. Measles, mumps, and rubella (MMR) vaccine  You may need at least one dose of MMR if you were born in 1957 or later. You may also need a second dose. Meningococcal conjugate (MenACWY) vaccine  You may need this if you have certain conditions. Hepatitis A vaccine  You may need this if you have certain conditions or if you travel or work in places where you may be exposed to hepatitis A. Hepatitis B vaccine  You may need this if you have certain conditions or if you travel or work in places where you may be exposed to hepatitis B. Haemophilus influenzae type b (Hib) vaccine  You may need this if you have certain conditions. You may receive vaccines as individual doses or as more than one vaccine together in one shot (combination vaccines). Talk with your health care provider about the risks and benefits of combination vaccines. What tests do I need? Blood tests  Lipid and cholesterol levels. These may be checked every 5 years, or more frequently depending on your overall health.  Hepatitis C test.  Hepatitis B test. Screening  Lung  cancer screening. You may have this screening every year starting at age 93 if you have a 30-pack-year history of smoking and currently smoke or have quit within the past 15 years.  Colorectal cancer screening. All adults should have this screening starting at age 35 and continuing until age 80. Your health care provider may recommend screening at age 66 if you are at increased risk. You will have tests every 1-10 years, depending on your results and the type of screening test.  Prostate cancer screening. Recommendations will vary depending on your family history and other risks.  Diabetes screening. This is done by checking your blood sugar (glucose) after you have not eaten for a while (fasting). You may have this done every 1-3 years.  Abdominal aortic aneurysm (AAA) screening. You may need this if you are a current or former smoker.  Sexually transmitted disease (STD) testing. Follow these instructions at home: Eating and drinking  Eat a diet that includes fresh fruits and vegetables, whole grains, lean protein, and low-fat dairy products. Limit your intake of foods with high amounts of sugar, saturated fats, and salt.  Take vitamin and mineral supplements as recommended by your health care provider.  Do not drink alcohol if your health care provider tells you not to drink.  If you drink alcohol: ? Limit how much you have to 0-2 drinks a day. ? Be aware of how much alcohol is in your drink. In the [place] one drink equals one 12 oz bottle of beer (355 mL), one 5 oz glass of wine (148 mL), or one 1 oz glass of hard liquor (44 mL). Lifestyle  Take daily care of your teeth and gums.  Stay active. Exercise for at least 30 minutes on 5 or more days each week.  Do not use any products that contain nicotine or tobacco, such as cigarettes, e-cigarettes, and chewing tobacco. If you need help quitting, ask your health care provider.  If you are sexually active, practice safe sex. Use a condom  or other form of protection to prevent STIs (sexually  transmitted infections).  Talk with your health care provider about taking a low-dose aspirin or statin. What's next?  Visit your health care provider once a year for a well check visit.  Ask your health care provider how often you should have your eyes and teeth checked.  Stay up to date on all vaccines. This information is not intended to replace advice given to you by your health care provider. Make sure you discuss any questions you have with your health care provider. Document Revised: 07/11/2018 Document Reviewed: 07/11/2018 Elsevier Patient Education  2020 [place], MD [facility]

## 2019-11-13 NOTE — Patient Instructions (Signed)
-Nice seeing you today!!  -Lab work today; will notify you once results are available.  -See you back in 6 months for follow up.   Preventive Care 83 Years and Older, Male Preventive care refers to lifestyle choices and visits with your health care provider that can promote health and wellness. This includes:  A yearly physical exam. This is also called an annual well check.  Regular dental and eye exams.  Immunizations.  Screening for certain conditions.  Healthy lifestyle choices, such as diet and exercise. What can I expect for my preventive care visit? Physical exam Your health care provider will check:  Height and weight. These may be used to calculate body mass index (BMI), which is a measurement that tells if you are at a healthy weight.  Heart rate and blood pressure.  Your skin for abnormal spots. Counseling Your health care provider may ask you questions about:  Alcohol, tobacco, and drug use.  Emotional well-being.  Home and relationship well-being.  Sexual activity.  Eating habits.  History of falls.  Memory and ability to understand (cognition).  Work and work Statistician. What immunizations do I need?  Influenza (flu) vaccine  This is recommended every year. Tetanus, diphtheria, and pertussis (Tdap) vaccine  You may need a Td booster every 10 years. Varicella (chickenpox) vaccine  You may need this vaccine if you have not already been vaccinated. Zoster (shingles) vaccine  You may need this after age 87. Pneumococcal conjugate (PCV13) vaccine  One dose is recommended after age 58. Pneumococcal polysaccharide (PPSV23) vaccine  One dose is recommended after age 24. Measles, mumps, and rubella (MMR) vaccine  You may need at least one dose of MMR if you were born in 1957 or later. You may also need a second dose. Meningococcal conjugate (MenACWY) vaccine  You may need this if you have certain conditions. Hepatitis A vaccine  You may  need this if you have certain conditions or if you travel or work in places where you may be exposed to hepatitis A. Hepatitis B vaccine  You may need this if you have certain conditions or if you travel or work in places where you may be exposed to hepatitis B. Haemophilus influenzae type b (Hib) vaccine  You may need this if you have certain conditions. You may receive vaccines as individual doses or as more than one vaccine together in one shot (combination vaccines). Talk with your health care provider about the risks and benefits of combination vaccines. What tests do I need? Blood tests  Lipid and cholesterol levels. These may be checked every 5 years, or more frequently depending on your overall health.  Hepatitis C test.  Hepatitis B test. Screening  Lung cancer screening. You may have this screening every year starting at age 64 if you have a 30-pack-year history of smoking and currently smoke or have quit within the past 15 years.  Colorectal cancer screening. All adults should have this screening starting at age 26 and continuing until age 38. Your health care provider may recommend screening at age 80 if you are at increased risk. You will have tests every 1-10 years, depending on your results and the type of screening test.  Prostate cancer screening. Recommendations will vary depending on your family history and other risks.  Diabetes screening. This is done by checking your blood sugar (glucose) after you have not eaten for a while (fasting). You may have this done every 1-3 years.  Abdominal aortic aneurysm (AAA) screening. You  may need this if you are a current or former smoker.  Sexually transmitted disease (STD) testing. Follow these instructions at home: Eating and drinking  Eat a diet that includes fresh fruits and vegetables, whole grains, lean protein, and low-fat dairy products. Limit your intake of foods with high amounts of sugar, saturated fats, and  salt.  Take vitamin and mineral supplements as recommended by your health care provider.  Do not drink alcohol if your health care provider tells you not to drink.  If you drink alcohol: ? Limit how much you have to 0-2 drinks a day. ? Be aware of how much alcohol is in your drink. In the [place] one drink equals one 12 oz bottle of beer (355 mL), one 5 oz glass of wine (148 mL), or one 1 oz glass of hard liquor (44 mL). Lifestyle  Take daily care of your teeth and gums.  Stay active. Exercise for at least 30 minutes on 5 or more days each week.  Do not use any products that contain nicotine or tobacco, such as cigarettes, e-cigarettes, and chewing tobacco. If you need help quitting, ask your health care provider.  If you are sexually active, practice safe sex. Use a condom or other form of protection to prevent STIs (sexually transmitted infections).  Talk with your health care provider about taking a low-dose aspirin or statin. What's next?  Visit your health care provider once a year for a well check visit.  Ask your health care provider how often you should have your eyes and teeth checked.  Stay up to date on all vaccines. This information is not intended to replace advice given to you by your health care provider. Make sure you discuss any questions you have with your health care provider. Document Revised: 07/11/2018 Document Reviewed: 07/11/2018 Elsevier Patient Education  2020 Elsevier Inc.  

## 2019-11-13 NOTE — Addendum Note (Signed)
Addended by: Elmer Picker on: 11/13/2019 08:04 AM   Modules accepted: Orders

## 2019-11-20 NOTE — Addendum Note (Signed)
Addended by: Erline Hau on: 11/20/2019 03:57 PM   Modules accepted: Level of Service

## 2019-11-28 ENCOUNTER — Other Ambulatory Visit: Payer: Self-pay

## 2019-12-01 ENCOUNTER — Ambulatory Visit (INDEPENDENT_AMBULATORY_CARE_PROVIDER_SITE_OTHER): Payer: Medicare Other | Admitting: Family Medicine

## 2019-12-01 ENCOUNTER — Encounter: Payer: Self-pay | Admitting: Family Medicine

## 2019-12-01 ENCOUNTER — Other Ambulatory Visit: Payer: Self-pay

## 2019-12-01 VITALS — BP 124/82 | HR 64 | Temp 98.1°F | Ht 69.0 in | Wt 173.0 lb

## 2019-12-01 DIAGNOSIS — L03012 Cellulitis of left finger: Secondary | ICD-10-CM | POA: Diagnosis not present

## 2019-12-01 MED ORDER — TRIAMCINOLONE ACETONIDE 0.1 % EX CREA: 1.0000 "application " | TOPICAL_CREAM | Freq: Two times a day (BID) | CUTANEOUS | 0 refills | Status: DC

## 2019-12-01 MED ORDER — DOXYCYCLINE HYCLATE 100 MG PO CAPS: 100.0000 mg | ORAL_CAPSULE | Freq: Two times a day (BID) | ORAL | 0 refills | Status: DC

## 2019-12-01 NOTE — Patient Instructions (Signed)
Keep hands dry as possible  Leave off the polysporin  Apply the Triamcinolone cream twice daily  Start the Antibiotic  Let me know if not improving in 3 weeks.

## 2019-12-01 NOTE — Progress Notes (Signed)
Subjective:     Patient ID: [name], male   DOB: 08-30-1934, 85 y.o.   MRN: [id number]  HPI   Mr. Gwinn seen with several month history of some inflammation left middle finger distally near the cuticle.  Denies any injury.  He has some mostly clear gelatinous type material that sometimes drains out from the skin with applied pressure.  He has not seen any pus.  He has recently had some distal finger erythema dorsally with mild tenderness.  He has been applying Polysporin without improvement.  Past Medical History:  Diagnosis Date  . Anemia   . GERD (gastroesophageal reflux disease)   . H/O hiatal hernia   . Headache(784.0)   . History of cystoscopy 1965  . History of hernia repair    left side:1991and 1978 right side: 2009  . Hx of benign prostatic hypertrophy   . Hx of colonic polyps    Dr. Earlean Shawl removed 2 in 2009  . Hyperlipidemia   . Hypothyroidism   . Shortness of breath    with exertion   . Sleep apnea    2008 no CPAP machine   . SVT (supraventricular tachycardia) ([place])    2002   . Thyroid disease    hypothyroidism   Past Surgical History:  Procedure Laterality Date  . bilateral hernia repair  [id number]  . borkne bones foot  85 yrs old  . broken finger  85 yrs old  . cystocopy  1965  . [place], 2009  . LAPAROSCOPIC NISSEN FUNDOPLICATION  [id number]   Procedure: LAPAROSCOPIC NISSEN FUNDOPLICATION;  Surgeon: Edward Jolly, MD;  Location: [facility];  Service: General;  Laterality: N/A;  Laparoscopic Repair of Large Hiatal Hernia with Nissen Fundoplication with mesh   . pneumonia  85 years old  . skin cancer face last ck on 08-22-07    . THYROIDECTOMY  1967    reports that he quit smoking about 34 years ago. His smoking use included cigarettes. He smoked 1.00 pack per day. He has never used smokeless tobacco. He reports current alcohol use of about 1.0 standard drinks of alcohol per week. He reports that he does not use drugs. family history includes  Cancer in his maternal grandmother and paternal grandmother; Cancer (age of onset: 92) in his mother; Dementia in his father; Dementia (age of onset: 40) in his mother; Heart disease in his father and mother; Heart failure in his father; Hypertension in his father; Macular degeneration in his mother; Thyroid disease in his mother. No Known Allergies   Review of Systems  Constitutional: Negative for chills and fever.       Objective:   Physical Exam Vitals reviewed.  Constitutional:      Appearance: Normal appearance.  Cardiovascular:     Rate and Rhythm: Normal rate and regular rhythm.  Skin:    Comments: Left middle finger reveals some deformity at the DIP joint from likely osteoarthritis.  He has some dystrophic nail changes near the base of the nail.  He has erythema extending from the DIP joint toward the cuticle.  There is some mild swelling in this region.  With some applied pressure he has little bit of gelatinous type material which is expressed out of the skin.  No visible purulent secretions  Neurological:     Mental Status: He is alert.        Assessment:     Several month history of chronic inflammation left middle finger involving the distal finger  near the cuticle.  Question chronic paronychia.  He does describe some gelatinous type material which was expressedtoday which raises question if he could have a digital mucous cyst in this region.    Plan:     -Keep hands dry as possible  -Leave off the Polysporin  -We recommend trial of triamcinolone 0.1% cream twice daily to apply around the cuticle region  -Doxycycline 100 mg twice daily for 10 days  -Touch base if not improving over the next few weeks.  Would consider referral to hand specialist if not improved with the above  Eulas Post MD [facility]

## 2019-12-03 ENCOUNTER — Encounter: Payer: Self-pay | Admitting: Internal Medicine

## 2019-12-05 ENCOUNTER — Ambulatory Visit (INDEPENDENT_AMBULATORY_CARE_PROVIDER_SITE_OTHER): Payer: Medicare Other | Admitting: Internal Medicine

## 2019-12-05 ENCOUNTER — Encounter: Payer: Self-pay | Admitting: Internal Medicine

## 2019-12-05 ENCOUNTER — Other Ambulatory Visit: Payer: Self-pay

## 2019-12-05 DIAGNOSIS — K219 Gastro-esophageal reflux disease without esophagitis: Secondary | ICD-10-CM

## 2019-12-05 DIAGNOSIS — G4733 Obstructive sleep apnea (adult) (pediatric): Secondary | ICD-10-CM | POA: Diagnosis not present

## 2019-12-05 NOTE — Patient Instructions (Signed)
We can continue CPAP auto 5-15, mask of choice, humidifier, supplies, AirView/ card  Please call if we can help

## 2019-12-05 NOTE — Progress Notes (Signed)
HPI  63 yoM former smoker followed for OSA, complicated by SVT, Syncope, Rhinitis, GERD, Hypothyroid NPSG 01/01/07- AHI 30/ hr, desaturation to 83%  ------------------------------------------------------------------------   5/72020-  83 yoM former smoker for sleep evaluation. Wants to establish for f/u. Medical problem list includes SVT, Syncope, Rhinitis, GERD, Hypothyroid,  -----referred by Dr. Isaac Bliss (PCP), OSA on CPAP for 7 years, last sleep study in 2008, states he uses every night, occasionally takes in off in the middle of the night. Apparently he never really established with a sleep doctor, but has felt well controlled.  NPSG 01/01/07- AHI 30/ hr, desaturation to 83% CPAP 10  / [employer] Download compliance 83%, AHI 0.7/ hr Current machine is 83 years old, used every night, but sometimes takes it off during the night.. Has SoClean. Rarely naps. Snores if w/o CPAP. Nasal pillows.  ENT- tonsils. Weight is stable.   12/05/19- 84 yoM former smoker followed for OSA, complicated by SVT, Syncope, Rhinitis, GERD, Hypothyroid CPAP auto 5-15/ [employer] Download compliance 97%, AHI 0.4/ hr Body weight today 173 lbs Had 2 Moderna Covax Has a So-Clean machine. Nasal pillows mask.   ROS-see HPI  + = positive Constitutional:    weight loss, night sweats, fevers, chills, fatigue, lassitude. HEENT:    headaches, +difficulty swallowing, tooth/dental problems, sore throat,       +sneezing, itching, ear ache, +nasal congestion, post nasal drip, snoring CV:    chest pain, orthopnea, PND, swelling in lower extremities, anasarca,                                   dizziness, +palpitations Resp:   shortness of breath with exertion or at rest.                productive cough,   +non-productive cough, coughing up of blood.              change in color of mucus.  wheezing.   Skin:    rash or lesions. GI:  + heartburn, indigestion, abdominal pain, nausea, vomiting, diarrhea,                 change in bowel  habits, loss of appetite GU: dysuria, change in color of urine, no urgency or frequency.   flank pain. MS:   joint pain, stiffness, decreased range of motion, back pain. Neuro-     nothing unusual Psych:  change in mood or affect.  depression or anxiety.   memory loss.  OBJ- Physical Exam General- Alert, Oriented, Affect-appropriate, Distress- none acute Skin- rash-none, lesions- none, excoriation- none Lymphadenopathy- none Head- atraumatic            Eyes- Gross vision intact, PERRLA, conjunctivae and secretions clear            Ears- Hearing, canals-normal            Nose- Clear, no-Septal dev, mucus, polyps, erosion, perforation             Throat- Mallampati III , mucosa clear , drainage- none, tonsils- atrophic Neck- flexible , trachea midline, no stridor , thyroid nl, carotid no bruit Chest - symmetrical excursion , unlabored           Heart/CV- RRR , no murmur , no gallop  , no rub, nl s1 s2                           -  JVD- none , edema- none, stasis changes- none, varices- none           Lung- clear to P&A, wheeze- none, cough- none , dullness-none, rub- none           Chest wall-  Abd-  Br/ Gen/ Rectal- Not done, not indicated Extrem- cyanosis- none, clubbing, none, atrophy- none, strength- nl Neuro- grossly intact to observation

## 2019-12-15 DIAGNOSIS — H353211 Exudative age-related macular degeneration, right eye, with active choroidal neovascularization: Secondary | ICD-10-CM | POA: Diagnosis not present

## 2019-12-30 ENCOUNTER — Telehealth: Payer: Self-pay | Admitting: Internal Medicine

## 2019-12-30 DIAGNOSIS — L03012 Cellulitis of left finger: Secondary | ICD-10-CM

## 2019-12-30 NOTE — Telephone Encounter (Signed)
Okay to refer? 

## 2019-12-30 NOTE — Telephone Encounter (Signed)
Ok to refer to hand surgery per BBs last note. Dr. Elease Hashimoto, if you disagree please let me know as I have not seen him for this issue.  [place]

## 2019-12-30 NOTE — Telephone Encounter (Signed)
Agree with referral.  Chronic paronychia can be difficult to clear and not sure surgeon can do much for that- but he had what appeared to be digital mucous cyst in region.

## 2019-12-30 NOTE — Addendum Note (Signed)
Addended by: Westley Hummer B on: 12/30/2019 01:43 PM   Modules accepted: Orders

## 2019-12-30 NOTE — Telephone Encounter (Signed)
Pt would like a referral to a specialist. Pt was seen 3 weeks ago for a finger infection and was given antibiotics per pt, his finger is still the same. Pt states, that Dr. Elease Hashimoto, told him to call the office if his finger didn't get better and ask for a referral. Thanks

## 2019-12-30 NOTE — Telephone Encounter (Signed)
Referral placed.

## 2020-01-05 DIAGNOSIS — Z961 Presence of intraocular lens: Secondary | ICD-10-CM | POA: Diagnosis not present

## 2020-01-05 DIAGNOSIS — H35451 Secondary pigmentary degeneration, right eye: Secondary | ICD-10-CM | POA: Diagnosis not present

## 2020-01-05 DIAGNOSIS — H353122 Nonexudative age-related macular degeneration, left eye, intermediate dry stage: Secondary | ICD-10-CM | POA: Diagnosis not present

## 2020-01-05 DIAGNOSIS — H53411 Scotoma involving central area, right eye: Secondary | ICD-10-CM | POA: Diagnosis not present

## 2020-01-05 DIAGNOSIS — H353211 Exudative age-related macular degeneration, right eye, with active choroidal neovascularization: Secondary | ICD-10-CM | POA: Diagnosis not present

## 2020-01-05 DIAGNOSIS — H35361 Drusen (degenerative) of macula, right eye: Secondary | ICD-10-CM | POA: Diagnosis not present

## 2020-01-06 DIAGNOSIS — M71341 Other bursal cyst, right hand: Secondary | ICD-10-CM | POA: Diagnosis not present

## 2020-01-06 DIAGNOSIS — M19041 Primary osteoarthritis, right hand: Secondary | ICD-10-CM | POA: Diagnosis not present

## 2020-01-06 DIAGNOSIS — M79645 Pain in left finger(s): Secondary | ICD-10-CM | POA: Diagnosis not present

## 2020-01-06 DIAGNOSIS — M19042 Primary osteoarthritis, left hand: Secondary | ICD-10-CM | POA: Diagnosis not present

## 2020-01-06 DIAGNOSIS — M6740 Ganglion, unspecified site: Secondary | ICD-10-CM | POA: Diagnosis not present

## 2020-01-11 NOTE — Assessment & Plan Note (Signed)
He understands to continue reflux precautions, especially during sleep.

## 2020-01-11 NOTE — Assessment & Plan Note (Signed)
Benefits with good compliance and control Plan- continue auto 5-15 

## 2020-02-13 DIAGNOSIS — H6123 Impacted cerumen, bilateral: Secondary | ICD-10-CM | POA: Diagnosis not present

## 2020-02-23 DIAGNOSIS — H353211 Exudative age-related macular degeneration, right eye, with active choroidal neovascularization: Secondary | ICD-10-CM | POA: Diagnosis not present

## 2020-03-29 DIAGNOSIS — H43813 Vitreous degeneration, bilateral: Secondary | ICD-10-CM | POA: Diagnosis not present

## 2020-03-29 DIAGNOSIS — H353122 Nonexudative age-related macular degeneration, left eye, intermediate dry stage: Secondary | ICD-10-CM | POA: Diagnosis not present

## 2020-03-29 DIAGNOSIS — H353211 Exudative age-related macular degeneration, right eye, with active choroidal neovascularization: Secondary | ICD-10-CM | POA: Diagnosis not present

## 2020-03-29 DIAGNOSIS — H524 Presbyopia: Secondary | ICD-10-CM | POA: Diagnosis not present

## 2020-03-30 DIAGNOSIS — H353211 Exudative age-related macular degeneration, right eye, with active choroidal neovascularization: Secondary | ICD-10-CM | POA: Diagnosis not present

## 2020-04-09 DIAGNOSIS — H6123 Impacted cerumen, bilateral: Secondary | ICD-10-CM | POA: Diagnosis not present

## 2020-04-09 DIAGNOSIS — H938X2 Other specified disorders of left ear: Secondary | ICD-10-CM | POA: Diagnosis not present

## 2020-04-13 DIAGNOSIS — Z20822 Contact with and (suspected) exposure to covid-19: Secondary | ICD-10-CM | POA: Diagnosis not present

## 2020-05-04 ENCOUNTER — Ambulatory Visit (INDEPENDENT_AMBULATORY_CARE_PROVIDER_SITE_OTHER): Payer: Medicare Other | Admitting: *Deleted

## 2020-05-04 ENCOUNTER — Encounter: Payer: Self-pay | Admitting: Internal Medicine

## 2020-05-04 ENCOUNTER — Other Ambulatory Visit: Payer: Self-pay

## 2020-05-04 DIAGNOSIS — Z23 Encounter for immunization: Secondary | ICD-10-CM | POA: Diagnosis not present

## 2020-05-05 DIAGNOSIS — H353211 Exudative age-related macular degeneration, right eye, with active choroidal neovascularization: Secondary | ICD-10-CM | POA: Diagnosis not present

## 2020-05-14 ENCOUNTER — Ambulatory Visit (INDEPENDENT_AMBULATORY_CARE_PROVIDER_SITE_OTHER): Payer: Medicare Other | Admitting: Internal Medicine

## 2020-05-14 ENCOUNTER — Encounter: Payer: Self-pay | Admitting: Internal Medicine

## 2020-05-14 ENCOUNTER — Other Ambulatory Visit: Payer: Self-pay

## 2020-05-14 VITALS — BP 120/70 | HR 72 | Temp 98.1°F | Ht 69.0 in | Wt 171.0 lb

## 2020-05-14 DIAGNOSIS — E039 Hypothyroidism, unspecified: Secondary | ICD-10-CM

## 2020-05-14 DIAGNOSIS — E559 Vitamin D deficiency, unspecified: Secondary | ICD-10-CM | POA: Diagnosis not present

## 2020-05-14 DIAGNOSIS — G4733 Obstructive sleep apnea (adult) (pediatric): Secondary | ICD-10-CM

## 2020-05-14 DIAGNOSIS — E785 Hyperlipidemia, unspecified: Secondary | ICD-10-CM | POA: Diagnosis not present

## 2020-05-14 DIAGNOSIS — K219 Gastro-esophageal reflux disease without esophagitis: Secondary | ICD-10-CM

## 2020-05-14 NOTE — Patient Instructions (Signed)
-  Nice seeing you today!!  -Keep up the great work and see you back in 6 months for your wellness visit. Please come in fasting that day.

## 2020-05-14 NOTE — Progress Notes (Signed)
Established Patient Office Visit     This visit occurred during the SARS-CoV-2 public health emergency.  Safety protocols were in place, including screening questions prior to the visit, additional usage of staff PPE, and extensive cleaning of exam room while observing appropriate contact time as indicated for disinfecting solutions.    CC/Reason for Visit: 23-month follow-up chronic medical conditions  HPI: [name] is a 85 y.o. male who is coming in today for the above mentioned reasons. Past Medical History is significant for: Obstructive sleep apnea on CPAP, hypothyroidism, GERD with hiatal hernia, hyperlipidemia.  He also has wet macular degeneration followed by his ophthalmologist with occasional intravitreal shots.  He has no acute complaints today.  His cancer screening is up-to-date, his immunizations are up-to-date.   Past Medical/Surgical History: Past Medical History:  Diagnosis Date  . Anemia   . GERD (gastroesophageal reflux disease)   . H/O hiatal hernia   . Headache(784.0)   . History of cystoscopy 1965  . History of hernia repair    left side:1991and 1978 right side: 2009  . Hx of benign prostatic hypertrophy   . Hx of colonic polyps    Dr. Earlean Shawl removed 2 in 2009  . Hyperlipidemia   . Hypothyroidism   . Shortness of breath    with exertion   . Sleep apnea    2008 no CPAP machine   . SVT (supraventricular tachycardia) ([place])    2002   . Thyroid disease    hypothyroidism    Past Surgical History:  Procedure Laterality Date  . bilateral hernia repair  [id number]  . borkne bones foot  85 yrs old  . broken finger  85 yrs old  . cystocopy  1965  . [place], 2009  . LAPAROSCOPIC NISSEN FUNDOPLICATION  09/29/9516   Procedure: LAPAROSCOPIC NISSEN FUNDOPLICATION;  Surgeon: Edward Jolly, MD;  Location: [facility];  Service: General;  Laterality: N/A;  Laparoscopic Repair of Large Hiatal Hernia with Nissen Fundoplication with mesh   .  pneumonia  85 years old  . skin cancer face last ck on 08-22-07    . THYROIDECTOMY  1967    Social History:  reports that he quit smoking about 34 years ago. His smoking use included cigarettes. He smoked 1.00 pack per day. He has never used smokeless tobacco. He reports current alcohol use of about 1.0 standard drink of alcohol per week. He reports that he does not use drugs.  Allergies: No Known Allergies  Family History:  Family History  Problem Relation Age of Onset  . Cancer Mother 18       breast  . Heart disease Mother   . Dementia Mother 6  . Thyroid disease Mother   . Macular degeneration Mother   . Heart disease Father   . Hypertension Father   . Heart failure Father   . Dementia Father   . Cancer Maternal Grandmother   . Cancer Paternal Grandmother      Current Outpatient Medications:  .  acetaminophen (TYLENOL) 500 MG tablet, Take 1,000 mg by mouth every 4 (four) hours as needed. For pain, Disp: , Rfl:  .  aspirin 81 MG tablet, Take 81 mg by mouth every morning. , Disp: , Rfl:  .  diphenhydrAMINE (BENADRYL) 25 MG tablet, Take 25-50 mg by mouth at bedtime., Disp: , Rfl:  .  levothyroxine (SYNTHROID) 125 MCG tablet, TAKE 1 TABLET BY MOUTH EVERY MORNING. GENERIC EQUIVALENT FOR FOR SYNTHROID., Disp:  90 tablet, Rfl: 4 .  metoprolol succinate (TOPROL-XL) 25 MG 24 hr tablet, Take 1 tablet (25 mg total) by mouth daily., Disp: 90 tablet, Rfl: 4 .  pantoprazole (PROTONIX) 40 MG tablet, Take 1 tablet (40 mg total) by mouth daily., Disp: 90 tablet, Rfl: 4 .  vitamin B-12 (CYANOCOBALAMIN) 1000 MCG tablet, Take 500 mcg by mouth daily. Patient states that he takes half tablet once a day, Disp: , Rfl:   Review of Systems:  Constitutional: Denies fever, chills, diaphoresis, appetite change and fatigue.  HEENT: Denies photophobia, eye pain, redness, hearing loss, ear pain, congestion, sore throat, rhinorrhea, sneezing, mouth sores, trouble swallowing, neck pain, neck stiffness and  tinnitus.   Respiratory: Denies SOB, DOE, cough, chest tightness,  and wheezing.   Cardiovascular: Denies chest pain, palpitations and leg swelling.  Gastrointestinal: Denies nausea, vomiting, abdominal pain, diarrhea, constipation, blood in stool and abdominal distention.  Genitourinary: Denies dysuria, urgency, frequency, hematuria, flank pain and difficulty urinating.  Endocrine: Denies: hot or cold intolerance, sweats, changes in hair or nails, polyuria, polydipsia. Musculoskeletal: Denies myalgias, back pain, joint swelling, arthralgias and gait problem.  Skin: Denies pallor, rash and wound.  Neurological: Denies dizziness, seizures, syncope, weakness, light-headedness, numbness and headaches.  Hematological: Denies adenopathy. Easy bruising, personal or family bleeding history  Psychiatric/Behavioral: Denies suicidal ideation, mood changes, confusion, nervousness, sleep disturbance and agitation    Physical Exam: Vitals:   05/14/20 0806  BP: 120/70  Pulse: 72  Temp: 98.1 F (36.7 C)  TempSrc: Oral  SpO2: 94%  Weight: 171 lb (77.6 kg)  Height: 5\' 9"  (1.753 m)    Body mass index is 25.25 kg/m.   Constitutional: NAD, calm, comfortable Eyes: PERRL, lids and conjunctivae normal, wears corrective lenses ENMT: Mucous membranes are moist. Tympanic membrane is pearly white, no erythema or bulging. Respiratory: clear to auscultation bilaterally, no wheezing, no crackles. Normal respiratory effort. No accessory muscle use.  Cardiovascular: Regular rate and rhythm, no murmurs / rubs / gallops. No extremity edema.  Neurologic: Grossly intact and nonfocal Psychiatric: Normal judgment and insight. Alert and oriented x 3. Normal mood.    Impression and Plan:  OSA (obstructive sleep apnea) -On CPAP nightly.  Gastroesophageal reflux disease without esophagitis -Well-controlled with diet and daily Protonix 40 mg.  Acquired hypothyroidism -On stable dose of levothyroxine, last TSH  was 1.530 in April 2021.  Vitamin D deficiency -Recheck levels when he returns for CPE.  Hyperlipidemia, unspecified hyperlipidemia type -Last LDL was 112 in April 2021, he is not on statin therapy.    Patient Instructions  -Nice seeing you today!!  -Keep up the great work and see you back in 6 months for your wellness visit. Please come in fasting that day.     Lelon Frohlich, MD [facility]

## 2020-06-09 DIAGNOSIS — H353211 Exudative age-related macular degeneration, right eye, with active choroidal neovascularization: Secondary | ICD-10-CM | POA: Diagnosis not present

## 2020-06-28 ENCOUNTER — Other Ambulatory Visit: Payer: Self-pay | Admitting: Internal Medicine

## 2020-06-28 DIAGNOSIS — I471 Supraventricular tachycardia: Secondary | ICD-10-CM

## 2020-06-28 DIAGNOSIS — K219 Gastro-esophageal reflux disease without esophagitis: Secondary | ICD-10-CM

## 2020-06-28 DIAGNOSIS — E039 Hypothyroidism, unspecified: Secondary | ICD-10-CM

## 2020-07-05 DIAGNOSIS — H353122 Nonexudative age-related macular degeneration, left eye, intermediate dry stage: Secondary | ICD-10-CM | POA: Diagnosis not present

## 2020-07-05 DIAGNOSIS — H35361 Drusen (degenerative) of macula, right eye: Secondary | ICD-10-CM | POA: Diagnosis not present

## 2020-07-05 DIAGNOSIS — H53411 Scotoma involving central area, right eye: Secondary | ICD-10-CM | POA: Diagnosis not present

## 2020-07-05 DIAGNOSIS — Z961 Presence of intraocular lens: Secondary | ICD-10-CM | POA: Diagnosis not present

## 2020-07-05 DIAGNOSIS — H35451 Secondary pigmentary degeneration, right eye: Secondary | ICD-10-CM | POA: Diagnosis not present

## 2020-07-05 DIAGNOSIS — H353211 Exudative age-related macular degeneration, right eye, with active choroidal neovascularization: Secondary | ICD-10-CM | POA: Diagnosis not present

## 2020-07-08 DIAGNOSIS — Z23 Encounter for immunization: Secondary | ICD-10-CM | POA: Diagnosis not present

## 2020-07-19 DIAGNOSIS — H353211 Exudative age-related macular degeneration, right eye, with active choroidal neovascularization: Secondary | ICD-10-CM | POA: Diagnosis not present

## 2020-08-23 DIAGNOSIS — H353211 Exudative age-related macular degeneration, right eye, with active choroidal neovascularization: Secondary | ICD-10-CM | POA: Diagnosis not present

## 2020-10-11 DIAGNOSIS — H353211 Exudative age-related macular degeneration, right eye, with active choroidal neovascularization: Secondary | ICD-10-CM | POA: Diagnosis not present

## 2020-11-15 ENCOUNTER — Other Ambulatory Visit: Payer: Self-pay

## 2020-11-16 ENCOUNTER — Ambulatory Visit (INDEPENDENT_AMBULATORY_CARE_PROVIDER_SITE_OTHER): Payer: Medicare Other | Admitting: Internal Medicine

## 2020-11-16 ENCOUNTER — Encounter: Payer: Self-pay | Admitting: Internal Medicine

## 2020-11-16 VITALS — BP 120/84 | HR 55 | Temp 97.7°F | Ht 69.0 in | Wt 171.0 lb

## 2020-11-16 DIAGNOSIS — Z86010 Personal history of colonic polyps: Secondary | ICD-10-CM | POA: Diagnosis not present

## 2020-11-16 DIAGNOSIS — E785 Hyperlipidemia, unspecified: Secondary | ICD-10-CM | POA: Diagnosis not present

## 2020-11-16 DIAGNOSIS — R5383 Other fatigue: Secondary | ICD-10-CM | POA: Diagnosis not present

## 2020-11-16 DIAGNOSIS — G4733 Obstructive sleep apnea (adult) (pediatric): Secondary | ICD-10-CM | POA: Diagnosis not present

## 2020-11-16 DIAGNOSIS — E559 Vitamin D deficiency, unspecified: Secondary | ICD-10-CM

## 2020-11-16 DIAGNOSIS — Z0000 Encounter for general adult medical examination without abnormal findings: Secondary | ICD-10-CM | POA: Diagnosis not present

## 2020-11-16 DIAGNOSIS — Z1283 Encounter for screening for malignant neoplasm of skin: Secondary | ICD-10-CM | POA: Diagnosis not present

## 2020-11-16 DIAGNOSIS — J302 Other seasonal allergic rhinitis: Secondary | ICD-10-CM

## 2020-11-16 DIAGNOSIS — Z125 Encounter for screening for malignant neoplasm of prostate: Secondary | ICD-10-CM | POA: Diagnosis not present

## 2020-11-16 DIAGNOSIS — E039 Hypothyroidism, unspecified: Secondary | ICD-10-CM

## 2020-11-16 DIAGNOSIS — K6389 Other specified diseases of intestine: Secondary | ICD-10-CM

## 2020-11-16 LAB — CBC WITH DIFFERENTIAL/PLATELET
Basophils Absolute: 0.1 10*3/uL (ref 0.0–0.1)
Basophils Relative: 1.4 % (ref 0.0–3.0)
Eosinophils Absolute: 0.1 10*3/uL (ref 0.0–0.7)
Eosinophils Relative: 3.1 % (ref 0.0–5.0)
HCT: 42.9 % (ref 39.0–52.0)
Hemoglobin: 14.3 g/dL (ref 13.0–17.0)
Lymphocytes Relative: 25.6 % (ref 12.0–46.0)
Lymphs Abs: 1.1 10*3/uL (ref 0.7–4.0)
MCHC: 33.3 g/dL (ref 30.0–36.0)
MCV: 86.8 fl (ref 78.0–100.0)
Monocytes Absolute: 0.5 10*3/uL (ref 0.1–1.0)
Monocytes Relative: 12.6 % — ABNORMAL HIGH (ref 3.0–12.0)
Neutro Abs: 2.4 10*3/uL (ref 1.4–7.7)
Neutrophils Relative %: 57.3 % (ref 43.0–77.0)
Platelets: 199 10*3/uL (ref 150.0–400.0)
RBC: 4.95 Mil/uL (ref 4.22–5.81)
RDW: 13.7 % (ref 11.5–15.5)
WBC: 4.3 10*3/uL (ref 4.0–10.5)

## 2020-11-16 LAB — COMPREHENSIVE METABOLIC PANEL
ALT: 16 U/L (ref 0–53)
AST: 17 U/L (ref 0–37)
Albumin: 3.9 g/dL (ref 3.5–5.2)
Alkaline Phosphatase: 81 U/L (ref 39–117)
BUN: 15 mg/dL (ref 6–23)
CO2: 29 mEq/L (ref 19–32)
Calcium: 9.3 mg/dL (ref 8.4–10.5)
Chloride: 100 mEq/L (ref 96–112)
Creatinine, Ser: 0.97 mg/dL (ref 0.40–1.50)
GFR: 72.01 mL/min (ref >60.00)
Glucose, Bld: 101 mg/dL — ABNORMAL HIGH (ref 70–99)
Potassium: 4.5 mEq/L (ref 3.5–5.1)
Sodium: 135 mEq/L (ref 135–145)
Total Bilirubin: 0.6 mg/dL (ref 0.2–1.2)
Total Protein: 6.9 g/dL (ref 6.0–8.3)

## 2020-11-16 LAB — VITAMIN D 25 HYDROXY (VIT D DEFICIENCY, FRACTURES): VITD: 32.68 ng/mL (ref 30.00–100.00)

## 2020-11-16 LAB — TSH: TSH: 0.6 u[IU]/mL (ref 0.35–4.50)

## 2020-11-16 LAB — LIPID PANEL
Cholesterol: 170 mg/dL (ref 0–200)
HDL: 49.2 mg/dL (ref >39.00)
LDL Cholesterol: 102 mg/dL — ABNORMAL HIGH (ref 0–99)
NonHDL: 120.6
Total CHOL/HDL Ratio: 3
Triglycerides: 94 mg/dL (ref 0.0–149.0)
VLDL: 18.8 mg/dL (ref 0.0–40.0)

## 2020-11-16 LAB — HEMOGLOBIN A1C: Hgb A1c MFr Bld: 5.8 % (ref 4.6–6.5)

## 2020-11-16 LAB — VITAMIN B12: Vitamin B-12: 629 pg/mL (ref 211–911)

## 2020-11-16 LAB — PSA: PSA: 0.74 ng/mL (ref 0.10–4.00)

## 2020-11-16 MED ORDER — SACCHAROMYCES BOULARDII 250 MG PO CAPS: 250.0000 mg | ORAL_CAPSULE | Freq: Two times a day (BID) | ORAL | 2 refills | Status: AC

## 2020-11-16 NOTE — Progress Notes (Signed)
Established Patient Office Visit     This visit occurred during the SARS-CoV-2 public health emergency.  Safety protocols were in place, including screening questions prior to the visit, additional usage of staff PPE, and extensive cleaning of exam room while observing appropriate contact time as indicated for disinfecting solutions.    CC/Reason for Visit: Annual preventive exam and subsequent Medicare wellness visit  HPI: [name] is a 86 y.o. male who is coming in today for the above mentioned reasons. Past Medical History is significant for: Obstructive sleep apnea on CPAP, hypothyroidism, GERD with hiatal hernia, hyperlipidemia.  He also has wet macular degeneration followed by his ophthalmologist with occasional intravitreal shots.    He has been feeling well.  We will monitor for  Cough that has been going on since the beginning of the spring, some mild fatigue but denies chest pain or shortness of breath with exertion, he has also had some loose stools for the past 3 months.  He had a colonoscopy in 2018.  He has routine eye and dental care.  All immunizations are up-to-date.  He has an appointment coming up soon for his fourth COVID-vaccine.   Past Medical/Surgical History: Past Medical History:  Diagnosis Date  . Anemia   . GERD (gastroesophageal reflux disease)   . H/O hiatal hernia   . Headache(784.0)   . History of cystoscopy 1965  . History of hernia repair    left side:1991and 1978 right side: 2009  . Hx of benign prostatic hypertrophy   . Hx of colonic polyps    Dr. Earlean Shawl removed 2 in 2009  . Hyperlipidemia   . Hypothyroidism   . Shortness of breath    with exertion   . Sleep apnea    2008 no CPAP machine   . SVT (supraventricular tachycardia) ([place])    2002   . Thyroid disease    hypothyroidism    Past Surgical History:  Procedure Laterality Date  . bilateral hernia repair  [id number]  . borkne bones foot  86 yrs old  . broken finger  86 yrs old   . cystocopy  1965  . [place], 2009  . LAPAROSCOPIC NISSEN FUNDOPLICATION  1/0/6269   Procedure: LAPAROSCOPIC NISSEN FUNDOPLICATION;  Surgeon: Edward Jolly, MD;  Location: [facility];  Service: General;  Laterality: N/A;  Laparoscopic Repair of Large Hiatal Hernia with Nissen Fundoplication with mesh   . pneumonia  86 years old  . skin cancer face last ck on 08-22-07    . THYROIDECTOMY  1967    Social History:  reports that he quit smoking about 35 years ago. His smoking use included cigarettes. He smoked 1.00 pack per day. He has never used smokeless tobacco. He reports current alcohol use of about 1.0 standard drink of alcohol per week. He reports that he does not use drugs.  Allergies: No Known Allergies  Family History:  Family History  Problem Relation Age of Onset  . Cancer Mother 70       breast  . Heart disease Mother   . Dementia Mother 45  . Thyroid disease Mother   . Macular degeneration Mother   . Heart disease Father   . Hypertension Father   . Heart failure Father   . Dementia Father   . Cancer Maternal Grandmother   . Cancer Paternal Grandmother      Current Outpatient Medications:  .  acetaminophen (TYLENOL) 500 MG tablet, Take 1,000 mg by  mouth every 4 (four) hours as needed. For pain, Disp: , Rfl:  .  aspirin 81 MG tablet, Take 81 mg by mouth every morning., Disp: , Rfl:  .  Calcium Citrate-Vitamin D (CALCIUM CITRATE+D3 PO), Take by mouth., Disp: , Rfl:  .  cetirizine (ZYRTEC) 10 MG tablet, Take 10 mg by mouth daily., Disp: , Rfl:  .  levothyroxine (SYNTHROID) 125 MCG tablet, TAKE 1 TABLET BY MOUTH EVERY MORNING., Disp: 90 tablet, Rfl: 1 .  metoprolol succinate (TOPROL-XL) 25 MG 24 hr tablet, TAKE 1 TABLET BY MOUTH DAILY GENERIC EQUIVALENT FOR TOPROL XL, Disp: 90 tablet, Rfl: 1 .  Multiple Vitamins-Minerals (ICAPS AREDS 2 PO), Take by mouth., Disp: , Rfl:  .  pantoprazole (PROTONIX) 40 MG tablet, TAKE 1 TABLET BY MOUTH DAILY GENERIC  EQUIVALENT FOR PROTONIX, Disp: 90 tablet, Rfl: 1 .  saccharomyces boulardii (FLORASTOR) 250 MG capsule, Take 1 capsule (250 mg total) by mouth 2 (two) times daily., Disp: 60 capsule, Rfl: 2 .  vitamin B-12 (CYANOCOBALAMIN) 1000 MCG tablet, Take 500 mcg by mouth daily. Patient states that he takes half tablet once a day, Disp: , Rfl:   Review of Systems:  Constitutional: Denies fever, chills, diaphoresis, appetite change and fatigue.  HEENT: Denies photophobia, eye pain, redness, hearing loss, ear pain, congestion, sore throat, rhinorrhea, sneezing, mouth sores, trouble swallowing, neck pain, neck stiffness and tinnitus.   Respiratory: Denies SOB, DOE, cough, chest tightness,  and wheezing.   Cardiovascular: Denies chest pain, palpitations and leg swelling.  Gastrointestinal: Denies nausea, vomiting, abdominal pain, diarrhea, constipation, blood in stool and abdominal distention.  Genitourinary: Denies dysuria, urgency, frequency, hematuria, flank pain and difficulty urinating.  Endocrine: Denies: hot or cold intolerance, sweats, changes in hair or nails, polyuria, polydipsia. Musculoskeletal: Denies myalgias, back pain, joint swelling, arthralgias and gait problem.  Skin: Denies pallor, rash and wound.  Neurological: Denies dizziness, seizures, syncope, weakness, light-headedness, numbness and headaches.  Hematological: Denies adenopathy. Easy bruising, personal or family bleeding history  Psychiatric/Behavioral: Denies suicidal ideation, mood changes, confusion, nervousness, sleep disturbance and agitation    Physical Exam: Vitals:   11/16/20 0757  BP: 120/84  Pulse: (!) 55  Temp: 97.7 F (36.5 C)  TempSrc: Oral  SpO2: 96%  Weight: 171 lb (77.6 kg)  Height: 5\' 9"  (1.753 m)    Body mass index is 25.25 kg/m.   Constitutional: NAD, calm, comfortable Eyes: PERRL, lids and conjunctivae normal, wears corrective lenses ENMT: Mucous membranes are moist. Posterior pharynx clear of any  exudate or lesions. Normal dentition. Tympanic membrane is pearly white, no erythema or bulging. Neck: normal, supple, no masses, no thyromegaly Respiratory: clear to auscultation bilaterally, no wheezing, no crackles. Normal respiratory effort. No accessory muscle use.  Cardiovascular: Regular rate and rhythm, no murmurs / rubs / gallops. No extremity edema. 2+ pedal pulses. No carotid bruits.  Abdomen: no tenderness, no masses palpated. No hepatosplenomegaly. Bowel sounds positive.  Musculoskeletal: no clubbing / cyanosis. No joint deformity upper and lower extremities. Good ROM, no contractures. Normal muscle tone.  Skin: no rashes, lesions, ulcers. No induration Neurologic: CN 2-12 grossly intact. Sensation intact, DTR normal. Strength 5/5 in all 4.  Psychiatric: Normal judgment and insight. Alert and oriented x 3. Normal mood.    Subsequent Medicare wellness visit   1. Risk factors, based on past  M,S,F -cardiovascular disease risk factors include age, gender, history of hyperlipidemia.   2.  Physical activities: He remains very active   3.  Depression/mood:  Stable,  not depressed   4.  Hearing:  No perceived issues   5.  ADL's: Independent in all ADLs   6.  Fall risk:  Low fall risk   7.  Home safety: No problems identified   8.  Height weight, and visual acuity: height and weight as above, vision:   Visual Acuity Screening   Right eye Left eye Both eyes  Without correction:     With correction: 20/32 20/25 20/32      9.  Counseling:  Advised to continue strides towards healthy lifestyle   10. Lab orders based on risk factors: Laboratory update will be reviewed   11. Referral :  None today   12. Care plan:  Advised to follow-up with me in 1 year or sooner as needed   13. Cognitive assessment:  No cognitive impairment   14. Screening: Patient provided with a written and personalized 5-10 year screening schedule in the AVS.   yes   15. Provider List Update:   PCP  only  16. Advance Directives: Full code   17. Opioids: Patient is not on any opioid prescriptions and has no risk factors for a substance use disorder.   [place] Office Visit from 11/13/2019 in [place] at [place]  PHQ-9 Total Score 0      Fall Risk  11/13/2019 10/11/2018 09/25/2018 06/25/2018 02/27/2018  Falls in the past year? 0 0 0 0 No  Comment - - - - [place] Telephone Survey: data to providers prior to load  Number falls in past yr: 0 0 - 0 -  Injury with Fall? 0 0 - 0 -  Risk for fall due to : - - - - -     Impression and Plan:  Encounter for preventive health examination  -He has routine eye and dental care. -He will get his fourth COVID-vaccine but otherwise his immunizations are up-to-date. -Screening labs today. -Healthy lifestyle discussed in detail. -PSA today per request for prostate cancer screening. -His last colonoscopy was in 2018, due for repeat in 2025.  Acquired hypothyroidism  - Plan: TSH -Continue current levothyroxine dosing.  Vitamin D deficiency  - Plan: VITAMIN D 25 Hydroxy (Vit-D Deficiency, Fractures)  Dyslipidemia -Last LDL was 112 in April 2021. -Recheck lipids today.  He is not on statins.  OSA (obstructive sleep apnea) -On nightly CPAP.  COLONIC POLYPS, HX OF -His last colonoscopy was in 2018, due for repeat in 2023.  Fatigue, unspecified type Check CBC, TSH, vitamin D, vitamin B12 today.  As he is not having chest pain or dyspnea on exertion, do not believe this is cardiac in origin.  Seasonal allergies -This is likely what is causing his chronic cough and mucus production. -I have advised that he start taking a daily antihistamine and twice daily guaifenesin.  Intestinal bacterial overgrowth  - Plan: saccharomyces boulardii (FLORASTOR) 250 MG capsule to take twice daily    Patient Instructions   -Nice seeing you today!!  -Lab work today; will notify you once results are available.  -Start florastor 250 mg  twice daily.  -Start mucinex 1 tablet twice daily.  -Remember your second COVID booster.  -Schedule follow up in 1 year or sooner as needed.    Preventive Care 27 Years and Older, Male Preventive care refers to lifestyle choices and visits with your health care provider that can promote health and wellness. This includes:  A yearly physical exam. This is also called an annual wellness visit.  Regular dental and eye exams.  Immunizations.  Screening for certain conditions.  Healthy lifestyle choices, such as: ? Eating a healthy diet. ? Getting regular exercise. ? Not using drugs or products that contain nicotine and tobacco. ? Limiting alcohol use. What can I expect for my preventive care visit? Physical exam Your health care provider will check your:  Height and weight. These may be used to calculate your BMI (body mass index). BMI is a measurement that tells if you are at a healthy weight.  Heart rate and blood pressure.  Body temperature.  Skin for abnormal spots. Counseling Your health care provider may ask you questions about your:  Past medical problems.  Family's medical history.  Alcohol, tobacco, and drug use.  Emotional well-being.  Home life and relationship well-being.  Sexual activity.  Diet, exercise, and sleep habits.  History of falls.  Memory and ability to understand (cognition).  Work and work Statistician.  Access to firearms. What immunizations do I need? Vaccines are usually given at various ages, according to a schedule. Your health care provider will recommend vaccines for you based on your age, medical history, and lifestyle or other factors, such as travel or where you work.   What tests do I need? Blood tests  Lipid and cholesterol levels. These may be checked every 5 years, or more often depending on your overall health.  Hepatitis C test.  Hepatitis B test. Screening  Lung cancer screening. You may have this screening  every year starting at age 20 if you have a 30-pack-year history of smoking and currently smoke or have quit within the past 15 years.  Colorectal cancer screening. ? All adults should have this screening starting at age 76 and continuing until age 48. ? Your health care provider may recommend screening at age 70 if you are at increased risk. ? You will have tests every 1-10 years, depending on your results and the type of screening test.  Prostate cancer screening. Recommendations will vary depending on your family history and other risks.  Genital exam to check for testicular cancer or hernias.  Diabetes screening. ? This is done by checking your blood sugar (glucose) after you have not eaten for a while (fasting). ? You may have this done every 1-3 years.  Abdominal aortic aneurysm (AAA) screening. You may need this if you are a current or former smoker.  STD (sexually transmitted disease) testing, if you are at risk. Follow these instructions at home: Eating and drinking  Eat a diet that includes fresh fruits and vegetables, whole grains, lean protein, and low-fat dairy products. Limit your intake of foods with high amounts of sugar, saturated fats, and salt.  Take vitamin and mineral supplements as recommended by your health care provider.  Do not drink alcohol if your health care provider tells you not to drink.  If you drink alcohol: ? Limit how much you have to 0-2 drinks a day. ? Be aware of how much alcohol is in your drink. In the [place] one drink equals one 12 oz bottle of beer (355 mL), one 5 oz glass of wine (148 mL), or one 1 oz glass of hard liquor (44 mL).   Lifestyle  Take daily care of your teeth and gums. Brush your teeth every morning and night with fluoride toothpaste. Floss one time each day.  Stay active. Exercise for at least 30 minutes 5 or more days each week.  Do not use any products that contain nicotine or tobacco, such as cigarettes, e-cigarettes,  and chewing tobacco. If you need help quitting, ask your health care provider.  Do not use drugs.  If you are sexually active, practice safe sex. Use a condom or other form of protection to prevent STIs (sexually transmitted infections).  Talk with your health care provider about taking a low-dose aspirin or statin.  Find healthy ways to cope with stress, such as: ? Meditation, yoga, or listening to music. ? Journaling. ? Talking to a trusted person. ? Spending time with friends and family. Safety  Always wear your seat belt while driving or riding in a vehicle.  Do not drive: ? If you have been drinking alcohol. Do not ride with someone who has been drinking. ? When you are tired or distracted. ? While texting.  Wear a helmet and other protective equipment during sports activities.  If you have firearms in your house, make sure you follow all gun safety procedures. What's next?  Visit your health care provider once a year for an annual wellness visit.  Ask your health care provider how often you should have your eyes and teeth checked.  Stay up to date on all vaccines. This information is not intended to replace advice given to you by your health care provider. Make sure you discuss any questions you have with your health care provider. Document Revised: 04/15/2019 Document Reviewed: 07/11/2018 Elsevier Patient Education  2021 [place], MD [facility]

## 2020-11-16 NOTE — Patient Instructions (Signed)
-Nice seeing you today!!  -Lab work today; will notify you once results are available.  -Start florastor 250 mg twice daily.  -Start mucinex 1 tablet twice daily.  -Remember your second COVID booster.  -Schedule follow up in 1 year or sooner as needed.    Preventive Care 40 Years and Older, Male Preventive care refers to lifestyle choices and visits with your health care provider that can promote health and wellness. This includes:  A yearly physical exam. This is also called an annual wellness visit.  Regular dental and eye exams.  Immunizations.  Screening for certain conditions.  Healthy lifestyle choices, such as: ? Eating a healthy diet. ? Getting regular exercise. ? Not using drugs or products that contain nicotine and tobacco. ? Limiting alcohol use. What can I expect for my preventive care visit? Physical exam Your health care provider will check your:  Height and weight. These may be used to calculate your BMI (body mass index). BMI is a measurement that tells if you are at a healthy weight.  Heart rate and blood pressure.  Body temperature.  Skin for abnormal spots. Counseling Your health care provider may ask you questions about your:  Past medical problems.  Family's medical history.  Alcohol, tobacco, and drug use.  Emotional well-being.  Home life and relationship well-being.  Sexual activity.  Diet, exercise, and sleep habits.  History of falls.  Memory and ability to understand (cognition).  Work and work Statistician.  Access to firearms. What immunizations do I need? Vaccines are usually given at various ages, according to a schedule. Your health care provider will recommend vaccines for you based on your age, medical history, and lifestyle or other factors, such as travel or where you work.   What tests do I need? Blood tests  Lipid and cholesterol levels. These may be checked every 5 years, or more often depending on your  overall health.  Hepatitis C test.  Hepatitis B test. Screening  Lung cancer screening. You may have this screening every year starting at age 19 if you have a 30-pack-year history of smoking and currently smoke or have quit within the past 15 years.  Colorectal cancer screening. ? All adults should have this screening starting at age 21 and continuing until age 61. ? Your health care provider may recommend screening at age 45 if you are at increased risk. ? You will have tests every 1-10 years, depending on your results and the type of screening test.  Prostate cancer screening. Recommendations will vary depending on your family history and other risks.  Genital exam to check for testicular cancer or hernias.  Diabetes screening. ? This is done by checking your blood sugar (glucose) after you have not eaten for a while (fasting). ? You may have this done every 1-3 years.  Abdominal aortic aneurysm (AAA) screening. You may need this if you are a current or former smoker.  STD (sexually transmitted disease) testing, if you are at risk. Follow these instructions at home: Eating and drinking  Eat a diet that includes fresh fruits and vegetables, whole grains, lean protein, and low-fat dairy products. Limit your intake of foods with high amounts of sugar, saturated fats, and salt.  Take vitamin and mineral supplements as recommended by your health care provider.  Do not drink alcohol if your health care provider tells you not to drink.  If you drink alcohol: ? Limit how much you have to 0-2 drinks a day. ? Be aware of  how much alcohol is in your drink. In the [place] one drink equals one 12 oz bottle of beer (355 mL), one 5 oz glass of wine (148 mL), or one 1 oz glass of hard liquor (44 mL).   Lifestyle  Take daily care of your teeth and gums. Brush your teeth every morning and night with fluoride toothpaste. Floss one time each day.  Stay active. Exercise for at least 30 minutes  5 or more days each week.  Do not use any products that contain nicotine or tobacco, such as cigarettes, e-cigarettes, and chewing tobacco. If you need help quitting, ask your health care provider.  Do not use drugs.  If you are sexually active, practice safe sex. Use a condom or other form of protection to prevent STIs (sexually transmitted infections).  Talk with your health care provider about taking a low-dose aspirin or statin.  Find healthy ways to cope with stress, such as: ? Meditation, yoga, or listening to music. ? Journaling. ? Talking to a trusted person. ? Spending time with friends and family. Safety  Always wear your seat belt while driving or riding in a vehicle.  Do not drive: ? If you have been drinking alcohol. Do not ride with someone who has been drinking. ? When you are tired or distracted. ? While texting.  Wear a helmet and other protective equipment during sports activities.  If you have firearms in your house, make sure you follow all gun safety procedures. What's next?  Visit your health care provider once a year for an annual wellness visit.  Ask your health care provider how often you should have your eyes and teeth checked.  Stay up to date on all vaccines. This information is not intended to replace advice given to you by your health care provider. Make sure you discuss any questions you have with your health care provider. Document Revised: 04/15/2019 Document Reviewed: 07/11/2018 Elsevier Patient Education  2021 [employer].

## 2020-11-17 DIAGNOSIS — M19042 Primary osteoarthritis, left hand: Secondary | ICD-10-CM | POA: Diagnosis not present

## 2020-11-17 DIAGNOSIS — M6740 Ganglion, unspecified site: Secondary | ICD-10-CM | POA: Diagnosis not present

## 2020-11-18 ENCOUNTER — Other Ambulatory Visit: Payer: Self-pay | Admitting: Orthopedic Surgery

## 2020-11-19 ENCOUNTER — Telehealth: Payer: Self-pay | Admitting: *Deleted

## 2020-11-19 NOTE — Telephone Encounter (Signed)
Spoke with patient and it is $250 for a 90 day prescription and $24 for 20 tabs.  Is there a different medication he can try?

## 2020-11-19 NOTE — Telephone Encounter (Signed)
PA was Denied for saccharomyces boulardii (FLORASTOR) 250 MG capsule  Patient's insurance will not cover "over the counter" medication.  Would you like to prescribe a different medication?

## 2020-11-22 DIAGNOSIS — H353211 Exudative age-related macular degeneration, right eye, with active choroidal neovascularization: Secondary | ICD-10-CM | POA: Diagnosis not present

## 2020-11-25 ENCOUNTER — Telehealth: Payer: Self-pay | Admitting: Internal Medicine

## 2020-11-25 NOTE — Telephone Encounter (Signed)
Patient's surgery is scheduled for 12/03/20.  When should he hold his ASA? Form in folder

## 2020-11-25 NOTE — Telephone Encounter (Signed)
Hassan Rowan from the [employer] called wanted to know if we have received the surgical form to know if he needs to stop taking any medication before surgery next week.  [place]

## 2020-11-26 ENCOUNTER — Encounter (HOSPITAL_BASED_OUTPATIENT_CLINIC_OR_DEPARTMENT_OTHER): Payer: Self-pay | Admitting: Orthopedic Surgery

## 2020-11-26 ENCOUNTER — Other Ambulatory Visit: Payer: Self-pay

## 2020-11-30 ENCOUNTER — Other Ambulatory Visit (HOSPITAL_COMMUNITY): Payer: Medicare Other

## 2020-11-30 NOTE — Telephone Encounter (Signed)
Hassan Rowan from the [facility] center is calling back regarding previous message and is requesting a call back, please advise. CB is [phone]

## 2020-12-01 ENCOUNTER — Other Ambulatory Visit (HOSPITAL_BASED_OUTPATIENT_CLINIC_OR_DEPARTMENT_OTHER)
Admission: RE | Admit: 2020-12-01 | Discharge: 2020-12-01 | Disposition: A | Discharge status: Home / Self Care | Payer: Medicare Other | Source: Ambulatory Visit | Attending: Orthopedic Surgery | Admitting: Orthopedic Surgery

## 2020-12-01 ENCOUNTER — Encounter (HOSPITAL_BASED_OUTPATIENT_CLINIC_OR_DEPARTMENT_OTHER)
Admission: RE | Admit: 2020-12-01 | Discharge: 2020-12-01 | Disposition: A | Discharge status: Home / Self Care | Payer: Medicare Other | Source: Ambulatory Visit | Attending: Orthopedic Surgery | Admitting: Orthopedic Surgery

## 2020-12-01 DIAGNOSIS — Z20822 Contact with and (suspected) exposure to covid-19: Secondary | ICD-10-CM | POA: Insufficient documentation

## 2020-12-01 DIAGNOSIS — Z01812 Encounter for preprocedural laboratory examination: Secondary | ICD-10-CM | POA: Insufficient documentation

## 2020-12-01 LAB — SARS CORONAVIRUS 2 (TAT 6-24 HRS): SARS Coronavirus 2: NEGATIVE

## 2020-12-01 NOTE — Telephone Encounter (Signed)
Left detailed message on machine for [facility] with Dr Ledell Noss recommendations.

## 2020-12-01 NOTE — Progress Notes (Signed)

## 2020-12-02 ENCOUNTER — Other Ambulatory Visit: Payer: Self-pay | Admitting: Internal Medicine

## 2020-12-02 ENCOUNTER — Encounter: Payer: Self-pay | Admitting: Internal Medicine

## 2020-12-02 DIAGNOSIS — I471 Supraventricular tachycardia: Secondary | ICD-10-CM

## 2020-12-02 DIAGNOSIS — E039 Hypothyroidism, unspecified: Secondary | ICD-10-CM

## 2020-12-02 DIAGNOSIS — K219 Gastro-esophageal reflux disease without esophagitis: Secondary | ICD-10-CM

## 2020-12-03 ENCOUNTER — Encounter (HOSPITAL_BASED_OUTPATIENT_CLINIC_OR_DEPARTMENT_OTHER)
Admission: RE | Disposition: A | Discharge status: Home / Self Care | Payer: Self-pay | Source: Ambulatory Visit | Attending: Orthopedic Surgery

## 2020-12-03 ENCOUNTER — Ambulatory Visit (HOSPITAL_BASED_OUTPATIENT_CLINIC_OR_DEPARTMENT_OTHER)
Admission: RE | Admit: 2020-12-03 | Discharge: 2020-12-03 | Disposition: A | Discharge status: Home / Self Care | Payer: Medicare Other | Source: Ambulatory Visit | Attending: Orthopedic Surgery | Admitting: Orthopedic Surgery

## 2020-12-03 ENCOUNTER — Other Ambulatory Visit: Payer: Self-pay

## 2020-12-03 ENCOUNTER — Ambulatory Visit (HOSPITAL_BASED_OUTPATIENT_CLINIC_OR_DEPARTMENT_OTHER): Payer: Medicare Other | Admitting: Anesthesiology

## 2020-12-03 ENCOUNTER — Encounter (HOSPITAL_BASED_OUTPATIENT_CLINIC_OR_DEPARTMENT_OTHER): Payer: Self-pay | Admitting: Orthopedic Surgery

## 2020-12-03 DIAGNOSIS — Z87891 Personal history of nicotine dependence: Secondary | ICD-10-CM | POA: Insufficient documentation

## 2020-12-03 DIAGNOSIS — Z8349 Family history of other endocrine, nutritional and metabolic diseases: Secondary | ICD-10-CM | POA: Diagnosis not present

## 2020-12-03 DIAGNOSIS — Z8249 Family history of ischemic heart disease and other diseases of the circulatory system: Secondary | ICD-10-CM | POA: Insufficient documentation

## 2020-12-03 DIAGNOSIS — Z7982 Long term (current) use of aspirin: Secondary | ICD-10-CM | POA: Diagnosis not present

## 2020-12-03 DIAGNOSIS — Z820 Family history of epilepsy and other diseases of the nervous system: Secondary | ICD-10-CM | POA: Diagnosis not present

## 2020-12-03 DIAGNOSIS — K219 Gastro-esophageal reflux disease without esophagitis: Secondary | ICD-10-CM | POA: Diagnosis not present

## 2020-12-03 DIAGNOSIS — E039 Hypothyroidism, unspecified: Secondary | ICD-10-CM | POA: Insufficient documentation

## 2020-12-03 DIAGNOSIS — M7989 Other specified soft tissue disorders: Secondary | ICD-10-CM | POA: Diagnosis not present

## 2020-12-03 DIAGNOSIS — M25742 Osteophyte, left hand: Secondary | ICD-10-CM | POA: Diagnosis not present

## 2020-12-03 DIAGNOSIS — Z79899 Other long term (current) drug therapy: Secondary | ICD-10-CM | POA: Diagnosis not present

## 2020-12-03 DIAGNOSIS — D638 Anemia in other chronic diseases classified elsewhere: Secondary | ICD-10-CM | POA: Diagnosis not present

## 2020-12-03 DIAGNOSIS — Z809 Family history of malignant neoplasm, unspecified: Secondary | ICD-10-CM | POA: Insufficient documentation

## 2020-12-03 DIAGNOSIS — M19042 Primary osteoarthritis, left hand: Secondary | ICD-10-CM | POA: Diagnosis not present

## 2020-12-03 DIAGNOSIS — L729 Follicular cyst of the skin and subcutaneous tissue, unspecified: Secondary | ICD-10-CM | POA: Diagnosis not present

## 2020-12-03 DIAGNOSIS — M67442 Ganglion, left hand: Secondary | ICD-10-CM | POA: Insufficient documentation

## 2020-12-03 DIAGNOSIS — M151 Heberden's nodes (with arthropathy): Secondary | ICD-10-CM | POA: Diagnosis not present

## 2020-12-03 DIAGNOSIS — Z803 Family history of malignant neoplasm of breast: Secondary | ICD-10-CM | POA: Diagnosis not present

## 2020-12-03 HISTORY — PX: EXCISION METACARPAL MASS: SHX6372

## 2020-12-03 HISTORY — DX: Unspecified macular degeneration: H35.30

## 2020-12-03 SURGERY — EXCISION METACARPAL MASS
Anesthesia: General | Site: Finger | Laterality: Left

## 2020-12-03 MED ORDER — PHENYLEPHRINE 40 MCG/ML (10ML) SYRINGE FOR IV PUSH (FOR BLOOD PRESSURE SUPPORT)
PREFILLED_SYRINGE | INTRAVENOUS | Status: AC
  Filled 2020-12-03: qty 10

## 2020-12-03 MED ORDER — CEFAZOLIN SODIUM-DEXTROSE 2-4 GM/100ML-% IV SOLN
INTRAVENOUS | Status: AC
  Filled 2020-12-03: qty 100

## 2020-12-03 MED ORDER — EPHEDRINE 5 MG/ML INJ
INTRAVENOUS | Status: AC
  Filled 2020-12-03: qty 10

## 2020-12-03 MED ORDER — LIDOCAINE 2% (20 MG/ML) 5 ML SYRINGE
INTRAMUSCULAR | Status: AC
  Filled 2020-12-03: qty 5

## 2020-12-03 MED ORDER — LIDOCAINE 2% (20 MG/ML) 5 ML SYRINGE
INTRAMUSCULAR | Status: DC | PRN
  Administered 2020-12-03: 60 mg via INTRAVENOUS

## 2020-12-03 MED ORDER — PROPOFOL 10 MG/ML IV BOLUS
INTRAVENOUS | Status: DC | PRN
  Administered 2020-12-03: 150 mg via INTRAVENOUS

## 2020-12-03 MED ORDER — 0.9 % SODIUM CHLORIDE (POUR BTL) OPTIME
TOPICAL | Status: DC | PRN
  Administered 2020-12-03: 100 mL

## 2020-12-03 MED ORDER — ONDANSETRON HCL 4 MG/2ML IJ SOLN
INTRAMUSCULAR | Status: DC | PRN
  Administered 2020-12-03: 4 mg via INTRAVENOUS

## 2020-12-03 MED ORDER — OXYCODONE HCL 5 MG PO TABS: 5.0000 mg | ORAL_TABLET | Freq: Once | ORAL | Status: DC | PRN

## 2020-12-03 MED ORDER — DEXAMETHASONE SODIUM PHOSPHATE 10 MG/ML IJ SOLN
INTRAMUSCULAR | Status: DC | PRN
  Administered 2020-12-03: 5 mg via INTRAVENOUS

## 2020-12-03 MED ORDER — BUPIVACAINE HCL (PF) 0.25 % IJ SOLN
INTRAMUSCULAR | Status: DC | PRN
  Administered 2020-12-03: 10 mL

## 2020-12-03 MED ORDER — HYDROMORPHONE HCL 1 MG/ML IJ SOLN: 0.2500 mg | INTRAMUSCULAR | Status: DC | PRN

## 2020-12-03 MED ORDER — PROMETHAZINE HCL 25 MG/ML IJ SOLN: 6.2500 mg | INTRAMUSCULAR | Status: DC | PRN

## 2020-12-03 MED ORDER — AMISULPRIDE (ANTIEMETIC) 5 MG/2ML IV SOLN: 10.0000 mg | Freq: Once | INTRAVENOUS | Status: DC | PRN

## 2020-12-03 MED ORDER — ONDANSETRON HCL 4 MG/2ML IJ SOLN
INTRAMUSCULAR | Status: AC
  Filled 2020-12-03: qty 2

## 2020-12-03 MED ORDER — SUCCINYLCHOLINE CHLORIDE 200 MG/10ML IV SOSY
PREFILLED_SYRINGE | INTRAVENOUS | Status: AC
  Filled 2020-12-03: qty 10

## 2020-12-03 MED ORDER — OXYCODONE HCL 5 MG/5ML PO SOLN: 5.0000 mg | Freq: Once | ORAL | Status: DC | PRN

## 2020-12-03 MED ORDER — LACTATED RINGERS IV SOLN: INTRAVENOUS | Status: DC

## 2020-12-03 MED ORDER — MIDAZOLAM HCL 2 MG/2ML IJ SOLN
INTRAMUSCULAR | Status: AC
  Filled 2020-12-03: qty 2

## 2020-12-03 MED ORDER — SULFAMETHOXAZOLE-TRIMETHOPRIM 800-160 MG PO TABS: 1.0000 | ORAL_TABLET | Freq: Two times a day (BID) | ORAL | 0 refills | Status: DC

## 2020-12-03 MED ORDER — DEXAMETHASONE SODIUM PHOSPHATE 10 MG/ML IJ SOLN
INTRAMUSCULAR | Status: AC
  Filled 2020-12-03: qty 1

## 2020-12-03 MED ORDER — CEFAZOLIN SODIUM-DEXTROSE 2-4 GM/100ML-% IV SOLN
2.0000 g | INTRAVENOUS | Status: AC
  Administered 2020-12-03: 2 g via INTRAVENOUS

## 2020-12-03 MED ORDER — FENTANYL CITRATE (PF) 100 MCG/2ML IJ SOLN
INTRAMUSCULAR | Status: DC | PRN
  Administered 2020-12-03 (×2): 50 ug via INTRAVENOUS

## 2020-12-03 MED ORDER — HYDROCODONE-ACETAMINOPHEN 5-325 MG PO TABS: ORAL_TABLET | ORAL | 0 refills | Status: DC

## 2020-12-03 MED ORDER — FENTANYL CITRATE (PF) 100 MCG/2ML IJ SOLN
INTRAMUSCULAR | Status: AC
  Filled 2020-12-03: qty 2

## 2020-12-03 SURGICAL SUPPLY — 52 items
APL PRP STRL LF DISP 70% ISPRP (MISCELLANEOUS) ×1
APL SKNCLS STERI-STRIP NONHPOA (GAUZE/BANDAGES/DRESSINGS)
BENZOIN TINCTURE PRP APPL 2/3 (GAUZE/BANDAGES/DRESSINGS) IMPLANT
BLADE MINI RND TIP GREEN BEAV (BLADE) IMPLANT
BLADE SURG 15 STRL LF DISP TIS (BLADE) ×2 IMPLANT
BLADE SURG 15 STRL SS (BLADE) ×4
BNDG CMPR 9X4 STRL LF SNTH (GAUZE/BANDAGES/DRESSINGS) ×1
BNDG COHESIVE 1X5 TAN STRL LF (GAUZE/BANDAGES/DRESSINGS) IMPLANT
BNDG COHESIVE 2X5 TAN STRL LF (GAUZE/BANDAGES/DRESSINGS) IMPLANT
BNDG CONFORM 2 STRL LF (GAUZE/BANDAGES/DRESSINGS) IMPLANT
BNDG ELASTIC 2X5.8 VLCR STR LF (GAUZE/BANDAGES/DRESSINGS) IMPLANT
BNDG ELASTIC 3X5.8 VLCR STR LF (GAUZE/BANDAGES/DRESSINGS) IMPLANT
BNDG ESMARK 4X9 LF (GAUZE/BANDAGES/DRESSINGS) ×1 IMPLANT
BNDG GAUZE 1X2.1 STRL (MISCELLANEOUS) ×1 IMPLANT
BNDG GAUZE ELAST 4 BULKY (GAUZE/BANDAGES/DRESSINGS) IMPLANT
BNDG PLASTER X FAST 3X3 WHT LF (CAST SUPPLIES) IMPLANT
BNDG PLSTR 9X3 FST ST WHT (CAST SUPPLIES)
CHLORAPREP W/TINT 26 (MISCELLANEOUS) ×2 IMPLANT
CORD BIPOLAR FORCEPS 12FT (ELECTRODE) ×2 IMPLANT
COVER BACK TABLE 60X90IN (DRAPES) ×2 IMPLANT
COVER MAYO STAND STRL (DRAPES) ×2 IMPLANT
COVER WAND RF STERILE (DRAPES) IMPLANT
CUFF TOURN SGL QUICK 18X4 (TOURNIQUET CUFF) ×2 IMPLANT
DRAPE EXTREMITY T 121X128X90 (DISPOSABLE) ×2 IMPLANT
DRAPE SURG 17X23 STRL (DRAPES) ×2 IMPLANT
GAUZE SPONGE 4X4 12PLY STRL (GAUZE/BANDAGES/DRESSINGS) ×2 IMPLANT
GAUZE XEROFORM 1X8 LF (GAUZE/BANDAGES/DRESSINGS) ×2 IMPLANT
GLOVE SRG 8 PF TXTR STRL LF DI (GLOVE) ×1 IMPLANT
GLOVE SURG ENC MOIS LTX SZ7.5 (GLOVE) ×2 IMPLANT
GLOVE SURG UNDER POLY LF SZ8 (GLOVE) ×2
GOWN STRL REUS W/ TWL LRG LVL3 (GOWN DISPOSABLE) ×1 IMPLANT
GOWN STRL REUS W/TWL LRG LVL3 (GOWN DISPOSABLE) ×2
GOWN STRL REUS W/TWL XL LVL3 (GOWN DISPOSABLE) ×2 IMPLANT
NDL HYPO 25X1 1.5 SAFETY (NEEDLE) ×1 IMPLANT
NEEDLE HYPO 25X1 1.5 SAFETY (NEEDLE) ×2 IMPLANT
NS IRRIG 1000ML POUR BTL (IV SOLUTION) ×2 IMPLANT
PACK BASIN DAY SURGERY FS (CUSTOM PROCEDURE TRAY) ×2 IMPLANT
PAD CAST 3X4 CTTN HI CHSV (CAST SUPPLIES) IMPLANT
PAD CAST 4YDX4 CTTN HI CHSV (CAST SUPPLIES) IMPLANT
PADDING CAST ABS 4INX4YD NS (CAST SUPPLIES)
PADDING CAST ABS COTTON 4X4 ST (CAST SUPPLIES) ×1 IMPLANT
PADDING CAST COTTON 3X4 STRL (CAST SUPPLIES)
PADDING CAST COTTON 4X4 STRL (CAST SUPPLIES)
SPLINT FINGER 3.25 911903 (SOFTGOODS) ×1 IMPLANT
STOCKINETTE 4X48 STRL (DRAPES) ×2 IMPLANT
STRIP CLOSURE SKIN 1/2X4 (GAUZE/BANDAGES/DRESSINGS) IMPLANT
SUT ETHILON 3 0 PS 1 (SUTURE) IMPLANT
SUT ETHILON 4 0 PS 2 18 (SUTURE) ×2 IMPLANT
SYR BULB EAR ULCER 3OZ GRN STR (SYRINGE) ×2 IMPLANT
SYR CONTROL 10ML LL (SYRINGE) ×2 IMPLANT
TOWEL GREEN STERILE FF (TOWEL DISPOSABLE) ×4 IMPLANT
UNDERPAD 30X36 HEAVY ABSORB (UNDERPADS AND DIAPERS) ×2 IMPLANT

## 2020-12-03 NOTE — Anesthesia Postprocedure Evaluation (Signed)
Anesthesia Post Note  Patient: [name]  Procedure(s) Performed: LEFT LONG FINGER EXCISION MUCOID CYST AND DEBRIDEMENT DISTAL INTERPHALANGEAL JOINT (Left Finger)     Patient location during evaluation: PACU Anesthesia Type: General Level of consciousness: awake and alert Pain management: pain level controlled Vital Signs Assessment: post-procedure vital signs reviewed and stable Respiratory status: spontaneous breathing, nonlabored ventilation and respiratory function stable Cardiovascular status: blood pressure returned to baseline and stable Postop Assessment: no apparent nausea or vomiting Anesthetic complications: no   No complications documented.  Last Vitals:  Vitals:   12/03/20 1445 12/03/20 1459  BP: 136/71 (!) 151/88  Pulse: (!) 51 60  Resp: 12 14  Temp:  (!) 36.1 C  SpO2: 97% 95%    Last Pain:  Vitals:   12/03/20 1459  TempSrc:   PainSc: 0-No pain                 Lynda Rainwater

## 2020-12-03 NOTE — Progress Notes (Signed)
HPI  M former smoker followed for OSA, complicated by SVT, Syncope, Rhinitis, GERD, Hypothyroid NPSG 01/01/07- AHI 30/ hr, desaturation to 83%  ------------------------------------------------------------------------   12/05/19- 84 yoM former smoker followed for OSA, complicated by SVT, Syncope, Rhinitis, GERD, Hypothyroid CPAP auto 5-15/ [employer] Download compliance 97%, AHI 0.4/ hr Body weight today 173 lbs Had 2 Moderna Covax Has a So-Clean machine. Nasal pillows mask.   12/06/20- 85 yoM former smoker followed for OSA, complicated by SVT, Syncope, Rhinitis, GERD, Hypothyroid CPAP auto 5-15/ [employer] Download- compliance 67%, AHI 0.2/ hr Body weight today-173 lbs Covid vax-2 Moderna, 1 Phizer Machine has been making abnormal noise in-synch with his breathing, so he turns if off. Wife tells him he still snores without CPAP. Machine is 84 yrs old. Discussed getting it serviced. At age 75, aware he is less energetic than in past. No acute change. L middle finger bandaged after surgery for infected cyst.   ROS-see HPI  + = positive Constitutional:    weight loss, night sweats, fevers, chills, fatigue, lassitude. HEENT:    headaches, +difficulty swallowing, tooth/dental problems, sore throat,       +sneezing, itching, ear ache, +nasal congestion, post nasal drip, snoring CV:    chest pain, orthopnea, PND, swelling in lower extremities, anasarca,                                   dizziness, +palpitations Resp:   shortness of breath with exertion or at rest.                productive cough,   +non-productive cough, coughing up of blood.              change in color of mucus.  wheezing.   Skin:    rash or lesions. GI:  + heartburn, indigestion, abdominal pain, nausea, vomiting, diarrhea,                 change in bowel habits, loss of appetite GU: dysuria, change in color of urine, no urgency or frequency.   flank pain. MS:   joint pain, stiffness, decreased range of motion, back pain. Neuro-      nothing unusual Psych:  change in mood or affect.  depression or anxiety.   memory loss.  OBJ- Physical Exam General- Alert, Oriented, Affect-appropriate, Distress- none acute, not obese Skin- rash-none, lesions- none, excoriation- none Lymphadenopathy- none Head- atraumatic            Eyes- Gross vision intact, PERRLA, conjunctivae and secretions clear            Ears- Hearing, canals-normal            Nose- Clear, no-Septal dev, mucus, polyps, erosion, perforation             Throat- Mallampati III , mucosa clear , drainage- none, tonsils- atrophic Neck- flexible , trachea midline, no stridor , thyroid nl, carotid no bruit Chest - symmetrical excursion , unlabored           Heart/CV- RRR , no murmur , no gallop  , no rub, nl s1 s2                           - JVD- none , edema- none, stasis changes- none, varices- none           Lung- clear to P&A, wheeze- none, cough-  none , dullness-none, rub- none           Chest wall-  Abd-  Br/ Gen/ Rectal- Not done, not indicated Extrem- + Left middle finger bandaged Neuro- grossly intact to observation

## 2020-12-03 NOTE — Op Note (Signed)
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  Left long finger excision of mucoid cyst 2.  Left long finger debridement of DIP joint   SURGEON:  [name], M.D.   ASSISTANT: none   ANESTHESIA:  General   INTRAVENOUS FLUIDS:  Per anesthesia flow sheet.   ESTIMATED BLOOD LOSS:  Minimal.   COMPLICATIONS:  None.   SPECIMENS:   Left long finger mucoid cyst   TOURNIQUET TIME:    Total Tourniquet Time Documented: Upper Arm (Left) - 16 minutes Total: Upper Arm (Left) - 16 minutes    DISPOSITION:  Stable to PACU.   INDICATIONS: 86 year old male has had a cyst on his left long finger.  This was drained from under the cuticle multiple times.  He wishes to have the cyst removed and the DIP joint debrided to try to prevent recurrence. Risks, benefits and alternatives of surgery were discussed including the risks of blood loss, infection, damage to nerves, vessels, tendons, ligaments, bone for surgery, need for additional surgery, complications with wound healing, continued pain, stiffness, recurrence.  He voiced understanding of these risks and elected to proceed.  OPERATIVE COURSE:  After being identified preoperatively by myself,  the patient and I agreed on the procedure and site of the procedure.  The surgical site was marked.  Surgical consent had been signed. He was given IV antibiotics as preoperative antibiotic prophylaxis. He was transferred to the operating room and placed on the operating table in supine position with the Left upper extremity on an arm board.  General anesthesia was induced by the anesthesiologist.  Left upper  extremity was prepped and draped in normal sterile orthopedic fashion.  A surgical pause was performed between the surgeons, anesthesia, and operating room staff and all were in agreement as to the patient, procedure, and site of procedure.  Tourniquet at the proximal aspect of the extremity was inflated to 250 mmHg after exsanguination of the arm with an Esmarch bandage.    A hockey-stick shaped incision was made at the DIP joint of the long finger.  This is carried in subcutaneous tissues by spreading technique.  The cyst was carefully freed up and removed with the synovectomy rongeurs.  It was leaking from under the cuticle.  There was a large portion of cyst coming from the DIP joint at the radial side.  This was removed and sent to pathology as well.  The DIP joint was debrided including synovium as well as osteophytes from the middle and distal phalanges.  The wound and joint were then copiously irrigated with sterile saline.  The wound was closed with 4-0 nylon in a horizontal mattress fashion.  A digital block of been performed started the case with quarter percent plain Marcaine to aid in postoperative analgesia.  The wound was dressed with sterile Xeroform 4 x 4 and wrapped with a Coban dressing lightly.  An AlumaFoam splint was placed and wrapped lightly with Coban dressing.  The tourniquet was deflated at 16 minutes.  Fingertips were pink with brisk capillary refill after deflation of tourniquet.  The operative  drapes were broken down.  The patient was  awoken from anesthesia safely.  He was transferred back to the stretcher and taken to PACU in stable condition.  I will see him back in the office in 1 week for postoperative followup.  I will give him a prescription for Norco 5/325 1-2 tabs PO q6 hours prn pain, dispense # 20 and Bactrim DS 1 p.o. twice daily x7 days.   [name] Cover, MD Electronically signed, 12/03/20

## 2020-12-03 NOTE — Transfer of Care (Signed)
Immediate Anesthesia Transfer of Care Note  Patient: [name]  Procedure(s) Performed: LEFT LONG FINGER EXCISION MUCOID CYST AND DEBRIDEMENT DISTAL INTERPHALANGEAL JOINT (Left Finger)  Patient Location: PACU  Anesthesia Type:General  Level of Consciousness: awake, alert , oriented and patient cooperative  Airway & Oxygen Therapy: Patient Spontanous Breathing and Patient connected to face mask oxygen  Post-op Assessment: Report given to RN and Post -op Vital signs reviewed and stable  Post vital signs: Reviewed and stable  Last Vitals:  Vitals Value Taken Time  BP    Temp    Pulse    Resp    SpO2      Last Pain:  Vitals:   12/03/20 1204  TempSrc: Oral  PainSc: 0-No pain      Patients Stated Pain Goal: 4 (33/35/45 6256)  Complications: No complications documented.

## 2020-12-03 NOTE — Anesthesia Procedure Notes (Signed)
Procedure Name: LMA Insertion Date/Time: 12/03/2020 1:45 PM Performed by: Willa Frater, CRNA Pre-anesthesia Checklist: Patient identified, Emergency Drugs available, Suction available and Patient being monitored Patient Re-evaluated:Patient Re-evaluated prior to induction Oxygen Delivery Method: Circle system utilized Preoxygenation: Pre-oxygenation with 100% oxygen Induction Type: IV induction Ventilation: Mask ventilation without difficulty LMA: LMA inserted LMA Size: 4.0 Number of attempts: 1 Airway Equipment and Method: Bite block Placement Confirmation: positive ETCO2 Tube secured with: Tape Dental Injury: Teeth and Oropharynx as per pre-operative assessment

## 2020-12-03 NOTE — H&P (Signed)
[name] is an 86 y.o. male.   Chief Complaint: mucoid cyst HPI: 86 year old male with mucoid cyst left long finger and DIP joint arthritis.  This is bothersome to him.  He intermittently leaks out from under his cuticle.  He wishes to have the cyst removed and the DIP joint debrided.  Allergies: No Known Allergies  Past Medical History:  Diagnosis Date  . Anemia   . GERD (gastroesophageal reflux disease)   . H/O hiatal hernia   . Headache(784.0)   . History of cystoscopy 1965  . History of hernia repair    left side:1991and 1978 right side: 2009  . Hx of benign prostatic hypertrophy   . Hx of colonic polyps    Dr. Earlean Shawl removed 2 in 2009  . Hyperlipidemia   . Hypothyroidism   . Macular degeneration   . Shortness of breath    with exertion   . Sleep apnea    2008 no CPAP machine   . SVT (supraventricular tachycardia) ([place])    2002   . Thyroid disease    hypothyroidism    Past Surgical History:  Procedure Laterality Date  . bilateral hernia repair  [id number]  . borkne bones foot  86 yrs old  . broken finger  86 yrs old  . cystocopy  1965  . [place], 2009  . LAPAROSCOPIC NISSEN FUNDOPLICATION  10/06/1015   Procedure: LAPAROSCOPIC NISSEN FUNDOPLICATION;  Surgeon: Edward Jolly, MD;  Location: [facility];  Service: General;  Laterality: N/A;  Laparoscopic Repair of Large Hiatal Hernia with Nissen Fundoplication with mesh   . pneumonia  86 years old  . skin cancer face last ck on 08-22-07    . THYROIDECTOMY  1967    Family History: Family History  Problem Relation Age of Onset  . Cancer Mother 61       breast  . Heart disease Mother   . Dementia Mother 71  . Thyroid disease Mother   . Macular degeneration Mother   . Heart disease Father   . Hypertension Father   . Heart failure Father   . Dementia Father   . Cancer Maternal Grandmother   . Cancer Paternal Grandmother     Social History:   reports that he quit smoking about 42 years  ago. His smoking use included cigarettes. He smoked 1.00 pack per day. He has never used smokeless tobacco. He reports current alcohol use of about 1.0 standard drink of alcohol per week. He reports that he does not use drugs.  Medications: Medications Prior to Admission  Medication Sig Dispense Refill  . acetaminophen (TYLENOL) 500 MG tablet Take 1,000 mg by mouth every 4 (four) hours as needed. For pain    . aspirin 81 MG tablet Take 81 mg by mouth every morning.    . Calcium Citrate-Vitamin D (CALCIUM CITRATE+D3 PO) Take by mouth.    . cetirizine (ZYRTEC) 10 MG tablet Take 10 mg by mouth daily.    Marland Kitchen levothyroxine (SYNTHROID) 125 MCG tablet TAKE 1 TABLET BY MOUTH EVERY MORNING. 90 tablet 1  . metoprolol succinate (TOPROL-XL) 25 MG 24 hr tablet TAKE 1 TABLET BY MOUTH DAILY GENERIC EQUIVALENT FOR TOPROL XL 90 tablet 1  . Multiple Vitamins-Minerals (ICAPS AREDS 2 PO) Take by mouth.    Marland Kitchen omeprazole (PRILOSEC) 20 MG capsule Take 20 mg by mouth daily.    Marland Kitchen saccharomyces boulardii (FLORASTOR) 250 MG capsule Take 1 capsule (250 mg total) by mouth 2 (two)  times daily. 60 capsule 2  . vitamin B-12 (CYANOCOBALAMIN) 1000 MCG tablet Take 500 mcg by mouth daily. Patient states that he takes half tablet once a day      No results found for this or any previous visit (from the past 48 hour(s)).  No results found.   A comprehensive review of systems was negative.  Blood pressure (!) 119/97, pulse (!) 57, temperature 97.9 F (36.6 C), temperature source Oral, resp. rate 18, height 5\' 9"  (1.753 m), weight 76.9 kg, SpO2 99 %.  General appearance: alert, cooperative and appears stated age Head: Normocephalic, without obvious abnormality, atraumatic Neck: supple, symmetrical, trachea midline Cardio: regular rate and rhythm Resp: clear to auscultation bilaterally Extremities: Intact sensation and capillary refill all digits.  +epl/fpl/io.  No wounds.  Pulses: 2+ and symmetric Skin: Skin color, texture,  turgor normal. 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 Non operative and operative treatment options have been discussed with the patient and patient wishes to proceed with operative treatment. Risks, benefits, and alternatives of surgery have been discussed and the patient agrees with the plan of care.   Leanora Cover 12/03/2020, 1:15 PM

## 2020-12-03 NOTE — Anesthesia Preprocedure Evaluation (Signed)
Anesthesia Evaluation  Patient identified by MRN, date of birth, ID band Patient awake    Reviewed: Allergy & Precautions, H&P , NPO status , Patient's Chart, lab work & pertinent test results, reviewed documented beta blocker date and time   Airway Mallampati: III  TM Distance: >3 FB Neck ROM: full    Dental  (+) Dental Advisory Given, Caps,    Pulmonary neg pulmonary ROS, shortness of breath and with exertion, sleep apnea , former smoker,    Pulmonary exam normal breath sounds clear to auscultation       Cardiovascular Exercise Tolerance: Good negative cardio ROS Normal cardiovascular exam+ dysrhythmias Supra Ventricular Tachycardia  Rhythm:regular Rate:Normal  Recent stress test was normal   Neuro/Psych  Headaches, negative neurological ROS  negative psych ROS   GI/Hepatic negative GI ROS, Neg liver ROS, hiatal hernia, GERD  Medicated and Controlled,  Endo/Other  negative endocrine ROSHypothyroidism   Renal/GU negative Renal ROS  negative genitourinary   Musculoskeletal   Abdominal Normal abdominal exam  (+)   Peds  Hematology negative hematology ROS (+)   Anesthesia Other Findings   Reproductive/Obstetrics negative OB ROS                             Anesthesia Physical  Anesthesia Plan  ASA: III  Anesthesia Plan: General   Post-op Pain Management:    Induction: Intravenous  PONV Risk Score and Plan: 2 and Ondansetron, Midazolam and Treatment may vary due to age or medical condition  Airway Management Planned: LMA  Additional Equipment:   Intra-op Plan:   Post-operative Plan: Extubation in OR  Informed Consent: I have reviewed the patients History and Physical, chart, labs and discussed the procedure including the risks, benefits and alternatives for the proposed anesthesia with the patient or authorized representative who has indicated his/her understanding and acceptance.      Dental Advisory Given  Plan Discussed with: Surgeon  Anesthesia Plan Comments:         Anesthesia Quick Evaluation

## 2020-12-03 NOTE — Discharge Instructions (Addendum)

## 2020-12-06 ENCOUNTER — Other Ambulatory Visit: Payer: Self-pay

## 2020-12-06 ENCOUNTER — Ambulatory Visit (INDEPENDENT_AMBULATORY_CARE_PROVIDER_SITE_OTHER): Payer: Medicare Other | Admitting: Internal Medicine

## 2020-12-06 ENCOUNTER — Encounter (HOSPITAL_BASED_OUTPATIENT_CLINIC_OR_DEPARTMENT_OTHER): Payer: Self-pay | Admitting: Orthopedic Surgery

## 2020-12-06 VITALS — BP 114/70 | HR 56 | Temp 97.2°F | Ht 70.0 in | Wt 173.0 lb

## 2020-12-06 DIAGNOSIS — I471 Supraventricular tachycardia, unspecified: Secondary | ICD-10-CM

## 2020-12-06 DIAGNOSIS — G4733 Obstructive sleep apnea (adult) (pediatric): Secondary | ICD-10-CM

## 2020-12-06 LAB — SURGICAL PATHOLOGY

## 2020-12-06 NOTE — Patient Instructions (Signed)
Order- DME [employer]- please service CPAP machine for abnormal noise Continue auto 5- 15, mask of choice, humidifier, supplies, Airview/ card  Please call if we can help

## 2020-12-20 ENCOUNTER — Other Ambulatory Visit: Payer: Self-pay | Admitting: Internal Medicine

## 2020-12-20 DIAGNOSIS — K219 Gastro-esophageal reflux disease without esophagitis: Secondary | ICD-10-CM

## 2020-12-28 DIAGNOSIS — Z23 Encounter for immunization: Secondary | ICD-10-CM | POA: Diagnosis not present

## 2020-12-29 ENCOUNTER — Telehealth: Payer: Self-pay | Admitting: Internal Medicine

## 2020-12-29 MED ORDER — OMEPRAZOLE 20 MG PO CPDR: 20.0000 mg | DELAYED_RELEASE_CAPSULE | Freq: Every day | ORAL | 1 refills | Status: DC

## 2020-12-29 NOTE — Telephone Encounter (Signed)
Refill sent.

## 2020-12-29 NOTE — Telephone Encounter (Signed)
Pt is calling in needing a refill on Rx omeprazole (PRILOSEC) 20 MG  #90   Pharm:  [facility]).  Pt would like to have a call when it is sent in.

## 2021-01-03 DIAGNOSIS — L570 Actinic keratosis: Secondary | ICD-10-CM | POA: Diagnosis not present

## 2021-01-03 DIAGNOSIS — L821 Other seborrheic keratosis: Secondary | ICD-10-CM | POA: Diagnosis not present

## 2021-01-03 DIAGNOSIS — D225 Melanocytic nevi of trunk: Secondary | ICD-10-CM | POA: Diagnosis not present

## 2021-01-03 DIAGNOSIS — D485 Neoplasm of uncertain behavior of skin: Secondary | ICD-10-CM | POA: Diagnosis not present

## 2021-01-03 DIAGNOSIS — L720 Epidermal cyst: Secondary | ICD-10-CM | POA: Diagnosis not present

## 2021-01-06 DIAGNOSIS — H353211 Exudative age-related macular degeneration, right eye, with active choroidal neovascularization: Secondary | ICD-10-CM | POA: Diagnosis not present

## 2021-02-01 DIAGNOSIS — Z20822 Contact with and (suspected) exposure to covid-19: Secondary | ICD-10-CM | POA: Diagnosis not present

## 2021-02-14 DIAGNOSIS — H353211 Exudative age-related macular degeneration, right eye, with active choroidal neovascularization: Secondary | ICD-10-CM | POA: Diagnosis not present

## 2021-02-14 DIAGNOSIS — Z961 Presence of intraocular lens: Secondary | ICD-10-CM | POA: Diagnosis not present

## 2021-02-14 DIAGNOSIS — H353122 Nonexudative age-related macular degeneration, left eye, intermediate dry stage: Secondary | ICD-10-CM | POA: Diagnosis not present

## 2021-02-14 DIAGNOSIS — H35363 Drusen (degenerative) of macula, bilateral: Secondary | ICD-10-CM | POA: Diagnosis not present

## 2021-02-14 DIAGNOSIS — H53411 Scotoma involving central area, right eye: Secondary | ICD-10-CM | POA: Diagnosis not present

## 2021-02-14 DIAGNOSIS — H35451 Secondary pigmentary degeneration, right eye: Secondary | ICD-10-CM | POA: Diagnosis not present

## 2021-02-16 DIAGNOSIS — H353211 Exudative age-related macular degeneration, right eye, with active choroidal neovascularization: Secondary | ICD-10-CM | POA: Diagnosis not present

## 2021-02-17 DIAGNOSIS — H353211 Exudative age-related macular degeneration, right eye, with active choroidal neovascularization: Secondary | ICD-10-CM | POA: Diagnosis not present

## 2021-03-30 ENCOUNTER — Other Ambulatory Visit: Payer: Self-pay

## 2021-03-30 ENCOUNTER — Encounter: Payer: Self-pay | Admitting: Internal Medicine

## 2021-03-30 ENCOUNTER — Ambulatory Visit (INDEPENDENT_AMBULATORY_CARE_PROVIDER_SITE_OTHER)
Admission: RE | Admit: 2021-03-30 | Discharge: 2021-03-30 | Disposition: A | Discharge status: Home / Self Care | Payer: Medicare Other | Source: Ambulatory Visit | Attending: Internal Medicine | Admitting: Internal Medicine

## 2021-03-30 ENCOUNTER — Telehealth (INDEPENDENT_AMBULATORY_CARE_PROVIDER_SITE_OTHER): Payer: Medicare Other | Admitting: Internal Medicine

## 2021-03-30 VITALS — Wt 168.0 lb

## 2021-03-30 DIAGNOSIS — J189 Pneumonia, unspecified organism: Secondary | ICD-10-CM

## 2021-03-30 DIAGNOSIS — K449 Diaphragmatic hernia without obstruction or gangrene: Secondary | ICD-10-CM | POA: Diagnosis not present

## 2021-03-30 DIAGNOSIS — R0602 Shortness of breath: Secondary | ICD-10-CM | POA: Diagnosis not present

## 2021-03-30 DIAGNOSIS — R059 Cough, unspecified: Secondary | ICD-10-CM | POA: Diagnosis not present

## 2021-03-30 MED ORDER — AMOXICILLIN-POT CLAVULANATE 875-125 MG PO TABS
1.0000 | ORAL_TABLET | Freq: Two times a day (BID) | ORAL | 0 refills | Status: DC
Start: 2021-03-30 — End: 2021-04-06

## 2021-03-30 NOTE — Progress Notes (Signed)
Virtual Visit via Video Note  I connected with [name] on 03/30/21 at  2:00 PM EDT by a video enabled telemedicine application and verified that I am speaking with the correct person using two identifiers.  Location patient: home Location provider: work office Persons participating in the virtual visit: patient, provider  I discussed the limitations of evaluation and management by telemedicine and the availability of in person appointments. The patient expressed understanding and agreed to proceed.   HPI: He has scheduled this visit to discuss a cough that has been ongoing for about 5 weeks.  This started after a trip to the beach.  The cough is productive of yellow/green sputum that he has shown me today.  He is now having a hoarse voice.  He feels very fatigued and drowsy.  He has not had any fevers, he is feeling a little shortness of breath even with routine tasks that he is accustomed to doing such as bringing up the trash cans from the street.  He has taken several COVID tests, most recently this morning throughout the duration of his illness that have been negative.  He has had all 4 COVID vaccinations.   ROS: Constitutional: Denies fever, chills, diaphoresis, appetite change. HEENT: Denies photophobia, eye pain, redness, hearing loss,  mouth sores, trouble swallowing, neck pain, neck stiffness and tinnitus.   Respiratory: Denies  chest tightness,  and wheezing.   Cardiovascular: Denies chest pain, palpitations and leg swelling.  Gastrointestinal: Denies nausea, vomiting, abdominal pain, diarrhea, constipation, blood in stool and abdominal distention.  Genitourinary: Denies dysuria, urgency, frequency, hematuria, flank pain and difficulty urinating.  Endocrine: Denies: hot or cold intolerance, sweats, changes in hair or nails, polyuria, polydipsia. Musculoskeletal: Denies myalgias, back pain, joint swelling, arthralgias and gait problem.  Skin: Denies pallor, rash and  wound.  Neurological: Denies dizziness, seizures, syncope, weakness, light-headedness, numbness and headaches.  Hematological: Denies adenopathy. Easy bruising, personal or family bleeding history  Psychiatric/Behavioral: Denies suicidal ideation, mood changes, confusion, nervousness, sleep disturbance and agitation   Past Medical History:  Diagnosis Date   Anemia    GERD (gastroesophageal reflux disease)    H/O hiatal hernia    Headache(784.0)    History of cystoscopy 1965   History of hernia repair    left side:1991and 1978 right side: 2009   Hx of benign prostatic hypertrophy    Hx of colonic polyps    Dr. Earlean Shawl removed 2 in 2009   Hyperlipidemia    Hypothyroidism    Macular degeneration    Shortness of breath    with exertion    Sleep apnea    2008 no CPAP machine    SVT (supraventricular tachycardia) ([place])    2002    Thyroid disease    hypothyroidism    Past Surgical History:  Procedure Laterality Date   bilateral hernia repair  [id number]   borkne bones foot  86 yrs old   broken finger  86 yrs old   cystocopy  [place] Left 12/03/2020   Procedure: LEFT LONG FINGER EXCISION MUCOID CYST AND [place];  Surgeon: Leanora Cover, MD;  Location: [facility];  Service: Orthopedics;  Laterality: Left;   HERNIA REPAIR  1991, 1998, 2009   LAPAROSCOPIC NISSEN FUNDOPLICATION  [id number]   Procedure: LAPAROSCOPIC NISSEN FUNDOPLICATION;  Surgeon: Edward Jolly, MD;  Location: [facility];  Service: General;  Laterality: N/A;  Laparoscopic Repair of Large Hiatal Hernia with Nissen  Fundoplication with mesh    pneumonia  86 years old   skin cancer face last ck on 08-22-07     THYROIDECTOMY  1967    Family History  Problem Relation Age of Onset   Cancer Mother 35       breast   Heart disease Mother    Dementia Mother 61   Thyroid disease Mother    Macular degeneration Mother    Heart disease Father     Hypertension Father    Heart failure Father    Dementia Father    Cancer Maternal Grandmother    Cancer Paternal Grandmother     SOCIAL HX:   reports that he quit smoking about 42 years ago. His smoking use included cigarettes. He smoked an average of 1 pack per day. He has never used smokeless tobacco. He reports current alcohol use of about 1.0 standard drink per week. He reports that he does not use drugs.   Current Outpatient Medications:    acetaminophen (TYLENOL) 500 MG tablet, Take 1,000 mg by mouth every 4 (four) hours as needed. For pain, Disp: , Rfl:    amoxicillin-clavulanate (AUGMENTIN) 875-125 MG tablet, Take 1 tablet by mouth 2 (two) times daily for 7 days., Disp: 14 tablet, Rfl: 0   Calcium Citrate-Vitamin D (CALCIUM CITRATE+D3 PO), Take by mouth., Disp: , Rfl:    cetirizine (ZYRTEC) 10 MG tablet, Take 10 mg by mouth daily., Disp: , Rfl:    levothyroxine (SYNTHROID) 125 MCG tablet, TAKE 1 TABLET BY MOUTH EVERY MORNING., Disp: 90 tablet, Rfl: 1   metoprolol succinate (TOPROL-XL) 25 MG 24 hr tablet, TAKE 1 TABLET BY MOUTH DAILY GENERIC EQUIVALENT FOR TOPROL XL, Disp: 90 tablet, Rfl: 1   Multiple Vitamins-Minerals (ICAPS AREDS 2 PO), Take by mouth., Disp: , Rfl:    omeprazole (PRILOSEC) 20 MG capsule, Take 1 capsule (20 mg total) by mouth daily., Disp: 90 capsule, Rfl: 1   vitamin B-12 (CYANOCOBALAMIN) 1000 MCG tablet, Take 500 mcg by mouth daily. Patient states that he takes half tablet once a day, Disp: , Rfl:   EXAM:   VITALS per patient if applicable: None reported  GENERAL: alert, oriented, appears well and in no acute distress  HEENT: atraumatic, conjunttiva clear, no obvious abnormalities on inspection of external nose and ears  NECK: normal movements of the head and neck  LUNGS: on inspection no signs of respiratory distress, breathing rate appears normal, no obvious gross increased work of breathing, gasping or wheezing  CV: no obvious cyanosis  MS: moves all  visible extremities without noticeable abnormality  PSYCH/NEURO: pleasant and cooperative, no obvious depression or anxiety, speech and thought processing grossly intact  ASSESSMENT AND PLAN:   Pneumonia due to infectious organism, unspecified laterality, unspecified part of lung  - Plan: amoxicillin-clavulanate (AUGMENTIN) 875-125 MG tablet, DG Chest 2 View -Based on duration of cough, colored phlegm and symptoms such as extreme fatigue and shortness of breath, I believe he might have a bacterial pneumonia. -He will go for chest x-ray today, I have also sent in a 7-day course of Augmentin. -He knows to contact me if symptoms fail to resolve.     I discussed the assessment and treatment plan with the patient. The patient was provided an opportunity to ask questions and all were answered. The patient agreed with the plan and demonstrated an understanding of the instructions.   The patient was advised to call back or seek an in-person evaluation if the symptoms worsen or if  the condition fails to improve as anticipated.    Lelon Frohlich, MD  [facility]

## 2021-04-05 ENCOUNTER — Telehealth: Payer: Self-pay

## 2021-04-05 ENCOUNTER — Other Ambulatory Visit: Payer: Self-pay

## 2021-04-05 NOTE — Telephone Encounter (Signed)
Spoke with patient and an appointment scheduled 

## 2021-04-05 NOTE — Telephone Encounter (Signed)
Patient calling complaining of dry cough which increases at night.  He is sleeping in is recliner and has tried Nyquil with some relief.  There is some fatigue, but no fever.    Please advise  [employer]

## 2021-04-05 NOTE — Telephone Encounter (Signed)
Patient called stating he does not feel any better since last visit pt requested a call back to discuss other options.

## 2021-04-06 ENCOUNTER — Ambulatory Visit (INDEPENDENT_AMBULATORY_CARE_PROVIDER_SITE_OTHER): Payer: Medicare Other | Admitting: Internal Medicine

## 2021-04-06 VITALS — BP 120/80 | HR 70 | Temp 98.4°F

## 2021-04-06 DIAGNOSIS — H353211 Exudative age-related macular degeneration, right eye, with active choroidal neovascularization: Secondary | ICD-10-CM | POA: Diagnosis not present

## 2021-04-06 DIAGNOSIS — H524 Presbyopia: Secondary | ICD-10-CM | POA: Diagnosis not present

## 2021-04-06 DIAGNOSIS — R058 Other specified cough: Secondary | ICD-10-CM | POA: Diagnosis not present

## 2021-04-06 DIAGNOSIS — Z961 Presence of intraocular lens: Secondary | ICD-10-CM | POA: Diagnosis not present

## 2021-04-06 MED ORDER — BENZONATATE 100 MG PO CAPS: 100.0000 mg | ORAL_CAPSULE | Freq: Two times a day (BID) | ORAL | 0 refills | Status: DC | PRN

## 2021-04-06 NOTE — Progress Notes (Signed)
Acute office Visit     This visit occurred during the SARS-CoV-2 public health emergency.  Safety protocols were in place, including screening questions prior to the visit, additional usage of staff PPE, and extensive cleaning of exam room while observing appropriate contact time as indicated for disinfecting solutions.    CC/Reason for Visit: Continued cough  HPI: [name] is a 86 y.o. male who is coming in today for the above mentioned reasons.  He was seen on August 31 via telemedicine consultation for a cough.  Please refer to that note for details but in brief, he was given an antibiotic and sent for a chest x-ray.  He has completed Augmentin with some relief but not complete.  Chest x-ray was negative for acute disease.  He has a history of GERD and is on pantoprazole 40 mg daily.  He has seasonal allergies and is currently taking Claritin daily.  He notes that the cough is worse at bedtime, will sometimes feel a tickle in his throat.  Past Medical/Surgical History: Past Medical History:  Diagnosis Date   Anemia    GERD (gastroesophageal reflux disease)    H/O hiatal hernia    Headache(784.0)    History of cystoscopy 1965   History of hernia repair    left side:1991and 1978 right side: 2009   Hx of benign prostatic hypertrophy    Hx of colonic polyps    Dr. Earlean Shawl removed 2 in 2009   Hyperlipidemia    Hypothyroidism    Macular degeneration    Shortness of breath    with exertion    Sleep apnea    2008 no CPAP machine    SVT (supraventricular tachycardia) ([place])    2002    Thyroid disease    hypothyroidism    Past Surgical History:  Procedure Laterality Date   bilateral hernia repair  [id number]   borkne bones foot  86 yrs old   broken finger  86 yrs old   cystocopy  [place] Left 12/03/2020   Procedure: LEFT [place];  Surgeon: Leanora Cover, MD;  Location: [facility];  Service: Orthopedics;  Laterality: Left;   [place], 2009   LAPAROSCOPIC NISSEN FUNDOPLICATION  [id number]   Procedure: LAPAROSCOPIC NISSEN FUNDOPLICATION;  Surgeon: Edward Jolly, MD;  Location: [facility];  Service: General;  Laterality: N/A;  Laparoscopic Repair of Large Hiatal Hernia with Nissen Fundoplication with mesh    pneumonia  86 years old   skin cancer face last ck on 08-22-07     THYROIDECTOMY  1967    Social History:  reports that he quit smoking about 42 years ago. His smoking use included cigarettes. He smoked an average of 1 pack per day. He has never used smokeless tobacco. He reports current alcohol use of about 1.0 standard drink per week. He reports that he does not use drugs.  Allergies: No Known Allergies  Family History:  Family History  Problem Relation Age of Onset   Cancer Mother 45       breast   Heart disease Mother    Dementia Mother 65   Thyroid disease Mother    Macular degeneration Mother    Heart disease Father    Hypertension Father    Heart failure Father    Dementia Father    Cancer Maternal Grandmother    Cancer Paternal Grandmother  Current Outpatient Medications:    acetaminophen (TYLENOL) 500 MG tablet, Take 1,000 mg by mouth every 4 (four) hours as needed. For pain, Disp: , Rfl:    benzonatate (TESSALON) 100 MG capsule, Take 1 capsule (100 mg total) by mouth 2 (two) times daily as needed for cough., Disp: 20 capsule, Rfl: 0   Calcium Citrate-Vitamin D (CALCIUM CITRATE+D3 PO), Take by mouth., Disp: , Rfl:    cetirizine (ZYRTEC) 10 MG tablet, Take 10 mg by mouth daily., Disp: , Rfl:    levothyroxine (SYNTHROID) 125 MCG tablet, TAKE 1 TABLET BY MOUTH EVERY MORNING., Disp: 90 tablet, Rfl: 1   metoprolol succinate (TOPROL-XL) 25 MG 24 hr tablet, TAKE 1 TABLET BY MOUTH DAILY GENERIC EQUIVALENT FOR TOPROL XL, Disp: 90 tablet, Rfl: 1   Multiple Vitamins-Minerals (ICAPS AREDS 2 PO), Take by mouth., Disp: ,  Rfl:    omeprazole (PRILOSEC) 20 MG capsule, Take 1 capsule (20 mg total) by mouth daily., Disp: 90 capsule, Rfl: 1   vitamin B-12 (CYANOCOBALAMIN) 1000 MCG tablet, Take 500 mcg by mouth daily. Patient states that he takes half tablet once a day, Disp: , Rfl:   Review of Systems:  Constitutional: Denies fever, chills, diaphoresis, appetite change and fatigue.  HEENT: Denies photophobia, eye pain, redness, hearing loss,  mouth sores, trouble swallowing, neck pain, neck stiffness and tinnitus.   Respiratory: Denies SOB, DOE, cough, chest tightness,  and wheezing.   Cardiovascular: Denies chest pain, palpitations and leg swelling.  Gastrointestinal: Denies nausea, vomiting, abdominal pain, diarrhea, constipation, blood in stool and abdominal distention.  Genitourinary: Denies dysuria, urgency, frequency, hematuria, flank pain and difficulty urinating.  Endocrine: Denies: hot or cold intolerance, sweats, changes in hair or nails, polyuria, polydipsia. Musculoskeletal: Denies myalgias, back pain, joint swelling, arthralgias and gait problem.  Skin: Denies pallor, rash and wound.  Neurological: Denies dizziness, seizures, syncope, weakness, light-headedness, numbness and headaches.  Hematological: Denies adenopathy. Easy bruising, personal or family bleeding history  Psychiatric/Behavioral: Denies suicidal ideation, mood changes, confusion, nervousness, sleep disturbance and agitation    Physical Exam: Vitals:   04/06/21 1446  BP: 120/80  Pulse: 70  Temp: 98.4 F (36.9 C)  TempSrc: Oral  SpO2: 95%    There is no height or weight on file to calculate BMI.   Constitutional: NAD, calm, comfortable Eyes: PERRL, lids and conjunctivae normal, wears corrective lenses ENMT: Mucous membranes are moist. Posterior pharynx is erythematous but clear of any exudate or lesions. Normal dentition. Tympanic membrane is pearly white, no erythema or bulging. Neck: normal, supple, no masses, no  thyromegaly Respiratory: clear to auscultation bilaterally, no wheezing, no crackles. Normal respiratory effort. No accessory muscle use.  Cardiovascular: Regular rate and rhythm, no murmurs / rubs / gallops. No extremity edema. Neurologic: Grossly intact and nonfocal Psychiatric: Normal judgment and insight. Alert and oriented x 3. Normal mood.    Impression and Plan:  Post-viral cough syndrome  - Plan: benzonatate (TESSALON) 100 MG capsule -This is most likely a postviral cough, have advised that this may take 6 to 8 weeks to fully resolve. -We have considered the possibility of postnasal drip and GERD as etiologies of his cough, he is already on PPI and antihistamine therapy. -I will give him Ladona Ridgel, he knows to contact me if his symptoms fail to resolve after a couple more weeks.    Lelon Frohlich, MD [facility]

## 2021-04-06 NOTE — Addendum Note (Signed)
Addended by: Westley Hummer B on: 04/06/2021 03:24 PM   Modules accepted: Orders

## 2021-04-11 DIAGNOSIS — H353211 Exudative age-related macular degeneration, right eye, with active choroidal neovascularization: Secondary | ICD-10-CM | POA: Diagnosis not present

## 2021-04-15 ENCOUNTER — Telehealth: Payer: Self-pay | Admitting: Internal Medicine

## 2021-04-15 DIAGNOSIS — R058 Other specified cough: Secondary | ICD-10-CM

## 2021-04-15 MED ORDER — BENZONATATE 100 MG PO CAPS: 100.0000 mg | ORAL_CAPSULE | Freq: Two times a day (BID) | ORAL | 0 refills | Status: DC | PRN

## 2021-04-15 NOTE — Telephone Encounter (Signed)
PT needs a refill of their benzonatate (TESSALON) 100 MG capsule called into the [facility] on file.

## 2021-04-15 NOTE — Telephone Encounter (Signed)
Refill sent.

## 2021-04-15 NOTE — Addendum Note (Signed)
Addended by: Westley Hummer B on: 04/15/2021 01:10 PM   Modules accepted: Orders

## 2021-04-24 ENCOUNTER — Encounter: Payer: Self-pay | Admitting: Internal Medicine

## 2021-04-24 NOTE — Assessment & Plan Note (Signed)
Benefits with good control. Uses it every night, but turning it off early due to "noise". Plan- continue auto 5-15. Get machine serviced.

## 2021-04-24 NOTE — Assessment & Plan Note (Signed)
Continues cardiology f/u.

## 2021-04-26 ENCOUNTER — Telehealth: Payer: Self-pay | Admitting: Internal Medicine

## 2021-04-26 NOTE — Telephone Encounter (Signed)
Patient requested a refill of tessalon to have on hand.  I explained he would need an office visit.  Patient will call back as needed.

## 2021-04-26 NOTE — Telephone Encounter (Signed)
PT called to advise that they were seen awhile back for a cough and they now feel 70-80% better and want to know if they should keep taking benzonatate (TESSALON) 100 MG capsule and mucinex. The PT feels like he should but wants some advise on what to do.

## 2021-05-04 DIAGNOSIS — L538 Other specified erythematous conditions: Secondary | ICD-10-CM | POA: Diagnosis not present

## 2021-05-04 DIAGNOSIS — L570 Actinic keratosis: Secondary | ICD-10-CM | POA: Diagnosis not present

## 2021-05-04 DIAGNOSIS — L820 Inflamed seborrheic keratosis: Secondary | ICD-10-CM | POA: Diagnosis not present

## 2021-05-04 DIAGNOSIS — R208 Other disturbances of skin sensation: Secondary | ICD-10-CM | POA: Diagnosis not present

## 2021-05-17 ENCOUNTER — Ambulatory Visit (INDEPENDENT_AMBULATORY_CARE_PROVIDER_SITE_OTHER): Payer: Medicare Other | Admitting: *Deleted

## 2021-05-17 ENCOUNTER — Other Ambulatory Visit: Payer: Self-pay

## 2021-05-17 DIAGNOSIS — Z23 Encounter for immunization: Secondary | ICD-10-CM

## 2021-05-18 DIAGNOSIS — Z23 Encounter for immunization: Secondary | ICD-10-CM | POA: Diagnosis not present

## 2021-05-23 DIAGNOSIS — H353211 Exudative age-related macular degeneration, right eye, with active choroidal neovascularization: Secondary | ICD-10-CM | POA: Diagnosis not present

## 2021-05-30 DIAGNOSIS — H353211 Exudative age-related macular degeneration, right eye, with active choroidal neovascularization: Secondary | ICD-10-CM | POA: Diagnosis not present

## 2021-05-30 DIAGNOSIS — H353231 Exudative age-related macular degeneration, bilateral, with active choroidal neovascularization: Secondary | ICD-10-CM | POA: Diagnosis not present

## 2021-05-30 DIAGNOSIS — H353221 Exudative age-related macular degeneration, left eye, with active choroidal neovascularization: Secondary | ICD-10-CM | POA: Diagnosis not present

## 2021-05-30 DIAGNOSIS — H35361 Drusen (degenerative) of macula, right eye: Secondary | ICD-10-CM | POA: Diagnosis not present

## 2021-05-30 DIAGNOSIS — Z961 Presence of intraocular lens: Secondary | ICD-10-CM | POA: Diagnosis not present

## 2021-05-30 DIAGNOSIS — H35451 Secondary pigmentary degeneration, right eye: Secondary | ICD-10-CM | POA: Diagnosis not present

## 2021-05-30 DIAGNOSIS — H3562 Retinal hemorrhage, left eye: Secondary | ICD-10-CM | POA: Diagnosis not present

## 2021-07-05 DIAGNOSIS — L720 Epidermal cyst: Secondary | ICD-10-CM | POA: Diagnosis not present

## 2021-07-05 DIAGNOSIS — L570 Actinic keratosis: Secondary | ICD-10-CM | POA: Diagnosis not present

## 2021-07-06 DIAGNOSIS — H353211 Exudative age-related macular degeneration, right eye, with active choroidal neovascularization: Secondary | ICD-10-CM | POA: Diagnosis not present

## 2021-07-07 DIAGNOSIS — H353221 Exudative age-related macular degeneration, left eye, with active choroidal neovascularization: Secondary | ICD-10-CM | POA: Diagnosis not present

## 2021-07-19 ENCOUNTER — Other Ambulatory Visit: Payer: Self-pay | Admitting: Internal Medicine

## 2021-08-11 DIAGNOSIS — H353221 Exudative age-related macular degeneration, left eye, with active choroidal neovascularization: Secondary | ICD-10-CM | POA: Diagnosis not present

## 2021-08-12 ENCOUNTER — Other Ambulatory Visit: Payer: Self-pay | Admitting: Internal Medicine

## 2021-08-12 DIAGNOSIS — E039 Hypothyroidism, unspecified: Secondary | ICD-10-CM

## 2021-08-12 DIAGNOSIS — I471 Supraventricular tachycardia: Secondary | ICD-10-CM

## 2021-08-23 ENCOUNTER — Other Ambulatory Visit: Payer: Self-pay

## 2021-08-23 MED ORDER — OMEPRAZOLE 20 MG PO CPDR: 20.0000 mg | DELAYED_RELEASE_CAPSULE | Freq: Every day | ORAL | 1 refills | Status: DC

## 2021-08-23 NOTE — Telephone Encounter (Signed)
Pt in office with wife. Request refill of Omeprazole 20mg  be sent [facility]. Medication not on medication list. Verbal order received from Dr Jerilee Hoh for Omeprazole 20mg  by mouth daily.

## 2021-08-29 DIAGNOSIS — H353211 Exudative age-related macular degeneration, right eye, with active choroidal neovascularization: Secondary | ICD-10-CM | POA: Diagnosis not present

## 2021-09-14 DIAGNOSIS — H353221 Exudative age-related macular degeneration, left eye, with active choroidal neovascularization: Secondary | ICD-10-CM | POA: Diagnosis not present

## 2021-10-10 DIAGNOSIS — H353211 Exudative age-related macular degeneration, right eye, with active choroidal neovascularization: Secondary | ICD-10-CM | POA: Diagnosis not present

## 2021-10-19 DIAGNOSIS — H353221 Exudative age-related macular degeneration, left eye, with active choroidal neovascularization: Secondary | ICD-10-CM | POA: Diagnosis not present

## 2021-10-20 DIAGNOSIS — Z20822 Contact with and (suspected) exposure to covid-19: Secondary | ICD-10-CM | POA: Diagnosis not present

## 2021-11-01 DIAGNOSIS — H35453 Secondary pigmentary degeneration, bilateral: Secondary | ICD-10-CM | POA: Diagnosis not present

## 2021-11-01 DIAGNOSIS — H53411 Scotoma involving central area, right eye: Secondary | ICD-10-CM | POA: Diagnosis not present

## 2021-11-01 DIAGNOSIS — H353231 Exudative age-related macular degeneration, bilateral, with active choroidal neovascularization: Secondary | ICD-10-CM | POA: Diagnosis not present

## 2021-11-01 DIAGNOSIS — Z961 Presence of intraocular lens: Secondary | ICD-10-CM | POA: Diagnosis not present

## 2021-11-01 DIAGNOSIS — H35363 Drusen (degenerative) of macula, bilateral: Secondary | ICD-10-CM | POA: Diagnosis not present

## 2021-11-16 DIAGNOSIS — Z20822 Contact with and (suspected) exposure to covid-19: Secondary | ICD-10-CM | POA: Diagnosis not present

## 2021-11-22 DIAGNOSIS — H353211 Exudative age-related macular degeneration, right eye, with active choroidal neovascularization: Secondary | ICD-10-CM | POA: Diagnosis not present

## 2021-11-23 DIAGNOSIS — H353221 Exudative age-related macular degeneration, left eye, with active choroidal neovascularization: Secondary | ICD-10-CM | POA: Diagnosis not present

## 2021-11-24 ENCOUNTER — Encounter: Payer: Self-pay | Admitting: Internal Medicine

## 2021-11-24 ENCOUNTER — Ambulatory Visit (INDEPENDENT_AMBULATORY_CARE_PROVIDER_SITE_OTHER): Payer: Medicare Other | Admitting: Internal Medicine

## 2021-11-24 VITALS — BP 120/80 | HR 56 | Temp 97.6°F | Ht 68.5 in | Wt 170.1 lb

## 2021-11-24 DIAGNOSIS — E559 Vitamin D deficiency, unspecified: Secondary | ICD-10-CM | POA: Diagnosis not present

## 2021-11-24 DIAGNOSIS — R5383 Other fatigue: Secondary | ICD-10-CM

## 2021-11-24 DIAGNOSIS — G4733 Obstructive sleep apnea (adult) (pediatric): Secondary | ICD-10-CM

## 2021-11-24 DIAGNOSIS — E785 Hyperlipidemia, unspecified: Secondary | ICD-10-CM

## 2021-11-24 DIAGNOSIS — Z125 Encounter for screening for malignant neoplasm of prostate: Secondary | ICD-10-CM | POA: Diagnosis not present

## 2021-11-24 DIAGNOSIS — Z860100 Personal history of colon polyps, unspecified: Secondary | ICD-10-CM

## 2021-11-24 DIAGNOSIS — Z0000 Encounter for general adult medical examination without abnormal findings: Secondary | ICD-10-CM | POA: Diagnosis not present

## 2021-11-24 DIAGNOSIS — Z1283 Encounter for screening for malignant neoplasm of skin: Secondary | ICD-10-CM

## 2021-11-24 DIAGNOSIS — E039 Hypothyroidism, unspecified: Secondary | ICD-10-CM | POA: Diagnosis not present

## 2021-11-24 LAB — COMPREHENSIVE METABOLIC PANEL WITH GFR
ALT: 17 U/L (ref 0–53)
AST: 20 U/L (ref 0–37)
Albumin: 4.2 g/dL (ref 3.5–5.2)
Alkaline Phosphatase: 83 U/L (ref 39–117)
BUN: 14 mg/dL (ref 6–23)
CO2: 27 meq/L (ref 19–32)
Calcium: 9 mg/dL (ref 8.4–10.5)
Chloride: 100 meq/L (ref 96–112)
Creatinine, Ser: 0.96 mg/dL (ref 0.40–1.50)
GFR: 72.39 mL/min
Glucose, Bld: 108 mg/dL — ABNORMAL HIGH (ref 70–99)
Potassium: 4.5 meq/L (ref 3.5–5.1)
Sodium: 134 meq/L — ABNORMAL LOW (ref 135–145)
Total Bilirubin: 0.6 mg/dL (ref 0.2–1.2)
Total Protein: 7.1 g/dL (ref 6.0–8.3)

## 2021-11-24 LAB — CBC WITH DIFFERENTIAL/PLATELET
Basophils Absolute: 0.1 10*3/uL (ref 0.0–0.1)
Basophils Relative: 1.3 % (ref 0.0–3.0)
Eosinophils Absolute: 0.1 10*3/uL (ref 0.0–0.7)
Eosinophils Relative: 3.4 % (ref 0.0–5.0)
HCT: 43.2 % (ref 39.0–52.0)
Hemoglobin: 14.3 g/dL (ref 13.0–17.0)
Lymphocytes Relative: 29 % (ref 12.0–46.0)
Lymphs Abs: 1.2 10*3/uL (ref 0.7–4.0)
MCHC: 33 g/dL (ref 30.0–36.0)
MCV: 87.3 fl (ref 78.0–100.0)
Monocytes Absolute: 0.6 10*3/uL (ref 0.1–1.0)
Monocytes Relative: 14 % — ABNORMAL HIGH (ref 3.0–12.0)
Neutro Abs: 2.1 10*3/uL (ref 1.4–7.7)
Neutrophils Relative %: 52.3 % (ref 43.0–77.0)
Platelets: 209 10*3/uL (ref 150.0–400.0)
RBC: 4.95 Mil/uL (ref 4.22–5.81)
RDW: 14.3 % (ref 11.5–15.5)
WBC: 4 10*3/uL (ref 4.0–10.5)

## 2021-11-24 LAB — LIPID PANEL
Cholesterol: 189 mg/dL (ref 0–200)
HDL: 50.4 mg/dL
LDL Cholesterol: 120 mg/dL — ABNORMAL HIGH (ref 0–99)
NonHDL: 138.89
Total CHOL/HDL Ratio: 4
Triglycerides: 95 mg/dL (ref 0.0–149.0)
VLDL: 19 mg/dL (ref 0.0–40.0)

## 2021-11-24 LAB — TSH: TSH: 0.81 u[IU]/mL (ref 0.35–5.50)

## 2021-11-24 LAB — VITAMIN D 25 HYDROXY (VIT D DEFICIENCY, FRACTURES): VITD: 70.21 ng/mL (ref 30.00–100.00)

## 2021-11-24 LAB — PSA: PSA: 0.64 ng/mL (ref 0.10–4.00)

## 2021-11-24 LAB — VITAMIN B12: Vitamin B-12: 761 pg/mL (ref 211–911)

## 2021-11-24 NOTE — Progress Notes (Signed)
? ? ? ?Established Patient Office Visit ? ? ? ? ?This visit occurred during the SARS-CoV-2 public health emergency.  Safety protocols were in place, including screening questions prior to the visit, additional usage of staff PPE, and extensive cleaning of exam room while observing appropriate contact time as indicated for disinfecting solutions.  ? ? ?CC/Reason for Visit: Subsequent Medicare wellness visit and yearly follow-up chronic conditions ? ?HPI: [name] is a 87 y.o. male who is coming in today for the above mentioned reasons. Past Medical History is significant for: Obstructive sleep apnea on CPAP therapy, hypothyroidism, GERD, hyperlipidemia, wet macular degeneration.  He had an intravitreal injection yesterday of his left eye, has been having difficulty seeing since, this usually does not happen when he gets these injections.  Over the last few months he has noticed increasing fatigue with exertion, he does state however that he is able to undertake big projects such as recently cutting down a tree in his property.  He denies chest discomfort.  He has routine eye and dental care, no perceived hearing issues.  He last had a colonoscopy in 2018 that was clean, he wonders about subsequent colonoscopies.  All immunizations are up-to-date and age-appropriate.  He is requesting dermatology referral for skin cancer screening as a friend recently had melanoma. ? ? ?Past Medical/Surgical History: ?Past Medical History:  ?Diagnosis Date  ? Anemia   ? GERD (gastroesophageal reflux disease)   ? H/O hiatal hernia   ? Headache(784.0)   ? History of cystoscopy 1965  ? History of hernia repair   ? left side:1991and 1978 right side: 2009  ? Hx of benign prostatic hypertrophy   ? Hx of colonic polyps   ? Dr. Earlean Shawl removed 2 in 2009  ? Hyperlipidemia   ? Hypothyroidism   ? Macular degeneration   ? Shortness of breath   ? with exertion   ? Sleep apnea   ? 2008 no CPAP machine   ? SVT (supraventricular tachycardia)  ([place])   ? 2002   ? Thyroid disease   ? hypothyroidism  ? ? ?Past Surgical History:  ?Procedure Laterality Date  ? bilateral hernia repair  [phone]  ? borkne bones foot  87 yrs old  ? broken finger  87 yrs old  ? cystocopy  1965  ? EXCISION METACARPAL MASS Left 12/03/2020  ? Procedure: LEFT LONG FINGER EXCISION MUCOID CYST AND DEBRIDEMENT DISTAL INTERPHALANGEAL JOINT;  Surgeon: Leanora Cover, MD;  Location: [place];  Service: Orthopedics;  Laterality: Left;  ? [place], 2009  ? LAPAROSCOPIC NISSEN FUNDOPLICATION  11/02/8097  ? Procedure: LAPAROSCOPIC NISSEN FUNDOPLICATION;  Surgeon: Edward Jolly, MD;  [facility];  Service: General;  Laterality: N/A;  Laparoscopic Repair of Large Hiatal Hernia with Nissen Fundoplication with mesh   ? pneumonia  87 years old  ? skin cancer face last ck on 08-22-07    ? THYROIDECTOMY  1967  ? ? ?Social History: ? reports that he quit smoking about 43 years ago. His smoking use included cigarettes. He smoked an average of 1 pack per day. He has never used smokeless tobacco. He reports current alcohol use of about 1.0 standard drink per week. He reports that he does not use drugs. ? ?Allergies: ?No Known Allergies ? ?Family History:  ?Family History  ?Problem Relation Age of Onset  ? Cancer Mother 62  ?     breast  ? Heart disease Mother   ? Dementia  Mother 71  ? Thyroid disease Mother   ? Macular degeneration Mother   ? Heart disease Father   ? Hypertension Father   ? Heart failure Father   ? Dementia Father   ? Cancer Maternal Grandmother   ? Cancer Paternal Grandmother   ? ? ? ?Current Outpatient Medications:  ?  acetaminophen (TYLENOL) 500 MG tablet, Take 1,000 mg by mouth every 4 (four) hours as needed. For pain, Disp: , Rfl:  ?  aspirin EC 81 MG tablet, Take 81 mg by mouth daily. Swallow whole., Disp: , Rfl:  ?  Calcium Citrate-Vitamin D (CALCIUM CITRATE+D3 PO), Take by mouth., Disp: , Rfl:  ?  cetirizine (ZYRTEC) 10 MG tablet, Take 10 mg  by mouth daily., Disp: , Rfl:  ?  levothyroxine (SYNTHROID) 125 MCG tablet, TAKE 1 TABLET BY MOUTH EVERY MORNING., Disp: 90 tablet, Rfl: 1 ?  metoprolol succinate (TOPROL-XL) 25 MG 24 hr tablet, Take 1 tablet (25 mg total) by mouth daily., Disp: 90 tablet, Rfl: 1 ?  Multiple Vitamins-Minerals (ICAPS AREDS 2 PO), Take by mouth., Disp: , Rfl:  ?  omeprazole (PRILOSEC) 20 MG capsule, Take 1 capsule (20 mg total) by mouth daily., Disp: 90 capsule, Rfl: 1 ?  pantoprazole (PROTONIX) 40 MG tablet, Take 40 mg by mouth daily., Disp: , Rfl:  ?  vitamin B-12 (CYANOCOBALAMIN) 1000 MCG tablet, Take 500 mcg by mouth daily. Patient states that he takes half tablet once a day, Disp: , Rfl:  ? ?Review of Systems:  ?Constitutional: Denies fever, chills, diaphoresis, appetite change . ?HEENT: Denies photophobia, eye pain, redness, hearing loss, ear pain, congestion, sore throat, rhinorrhea, sneezing, mouth sores, trouble swallowing, neck pain, neck stiffness and tinnitus.   ?Respiratory: Denies SOB, DOE, cough, chest tightness,  and wheezing.   ?Cardiovascular: Denies chest pain, palpitations and leg swelling.  ?Gastrointestinal: Denies nausea, vomiting, abdominal pain, diarrhea, constipation, blood in stool and abdominal distention.  ?Genitourinary: Denies dysuria, urgency, frequency, hematuria, flank pain and difficulty urinating.  ?Endocrine: Denies: hot or cold intolerance, sweats, changes in hair or nails, polyuria, polydipsia. ?Musculoskeletal: Denies myalgias, back pain, joint swelling, arthralgias and gait problem.  ?Skin: Denies pallor, rash and wound.  ?Neurological: Denies dizziness, seizures, syncope, weakness, light-headedness, numbness and headaches.  ?Hematological: Denies adenopathy. Easy bruising, personal or family bleeding history  ?Psychiatric/Behavioral: Denies suicidal ideation, mood changes, confusion, nervousness, sleep disturbance and agitation ? ? ? ?Physical Exam: ?Vitals:  ? 11/24/21 0902  ?BP: 120/80   ?Pulse: (!) 56  ?Temp: 97.6 ?F (36.4 ?C)  ?TempSrc: Oral  ?SpO2: 97%  ?Weight: 170 lb 1.6 oz (77.2 kg)  ?Height: 5' 8.5" (1.74 m)  ? ? ?Body mass index is 25.49 kg/m?. ? ? ?Constitutional: NAD, calm, comfortable ?Eyes: PERRL, lids and conjunctivae normal, wears corrective lenses ?ENMT: Mucous membranes are moist. Posterior pharynx clear of any exudate or lesions. Normal dentition. Tympanic membrane is pearly white, no erythema or bulging. ?Neck: normal, supple, no masses, no thyromegaly ?Respiratory: clear to auscultation bilaterally, no wheezing, no crackles. Normal respiratory effort. No accessory muscle use.  ?Cardiovascular: Regular rate and rhythm, no murmurs / rubs / gallops. No extremity edema. 2+ pedal pulses. No carotid bruits.  ?Abdomen: no tenderness, no masses palpated. No hepatosplenomegaly. Bowel sounds positive.  ?Musculoskeletal: no clubbing / cyanosis. No joint deformity upper and lower extremities. Good ROM, no contractures. Normal muscle tone.  ?Skin: no rashes, lesions, ulcers. No induration ?Neurologic: CN 2-12 grossly intact. Sensation intact, DTR normal. Strength 5/5 in all  4.  ?Psychiatric: Normal judgment and insight. Alert and oriented x 3. Normal mood.  ? ? ?Subsequent Medicare wellness visit ?  ?1. Risk factors, based on past  M,S,F -cardiovascular disease risk factors include age, gender, history of hyperlipidemia. ?  ?2.  Physical activities: He remains physically active ?  ?3.  Depression/mood: Stable, not depressed ?  ?4.  Hearing: No perceived issues ?  ?5.  ADL's: Independent in all ADLs ?  ?6.  Fall risk: Low fall risk ?  ?7.  Home safety: No problems identified ?  ?8.  Height weight, and visual acuity: height and weight as above, vision: ? ?He has declined vision exam today as he is having difficulty seeing due to his recent intravitreal injection. ?  ?9.  Counseling: We have counseled on lifestyle modifications ?  ?10. Lab orders based on risk factors: Laboratory update will be  reviewed ?  ?11. Referral : Dermatology ?  ?12. Care plan: Follow-up with me in 6 to 12 months ?  ?13. Cognitive assessment: No cognitive impairment ?  ?14. Screening: Patient provided with a written and pers

## 2021-11-24 NOTE — Patient Instructions (Signed)
-  Nice seeing you today!! ? ?-Lab work today; will notify you once results are available. ? ?-See you back in 1 year or sooner as needed. ? ? ?

## 2021-11-28 ENCOUNTER — Other Ambulatory Visit: Payer: Self-pay | Admitting: *Deleted

## 2021-11-28 DIAGNOSIS — E785 Hyperlipidemia, unspecified: Secondary | ICD-10-CM

## 2021-12-02 DIAGNOSIS — Z20822 Contact with and (suspected) exposure to covid-19: Secondary | ICD-10-CM | POA: Diagnosis not present

## 2021-12-03 NOTE — Progress Notes (Signed)
HPI ? M former smoker followed for OSA, complicated by SVT, Syncope, Rhinitis, GERD, Hypothyroid ?NPSG 01/01/07- AHI 30/ hr, desaturation to 83% ? ?------------------------------------------------------------------------ ? ? ?12/06/20- 87 yoM former smoker followed for OSA, complicated by SVT, Syncope, Rhinitis, GERD, Hypothyroid ?CPAP auto 5-15/ [facility] ?Download- compliance 67%, AHI 0.2/ hr ?Body weight today-173 lbs ?Covid vax-2 Moderna, 1 Phizer ?Machine has been making abnormal noise in-synch with his breathing, so he turns if off. Wife tells him he still snores without CPAP. ?Machine is 87 yrs old. Discussed getting it serviced. ?At age 27, aware he is less energetic than in past. No acute change. ?L middle finger bandaged after surgery for infected cyst. ? ?12/06/21-  87 yoM former smoker followed for OSA, complicated by SVT, Syncope, Rhinitis, GERD, Hypothyroid, Hyperlipidemia, Macular Degeneration,  ?CPAP auto 5-15/ [facility] ?Download- compliance 80%, AHI 0.3/ hr ?Body weight today-169 lbs ?Covid vax-2 Moderna, 1 Phizer ?Flu vax- ?------Patient is doing good sleeping good. Wants to talk about the inspire ?He has been doing extremely well with CPAP.  Sometimes leaves it off if he gets up for the bathroom at the end of the night but no concerns and machine is working very well.  Download reviewed with him.  Wife here and confirms. ?He asked about the ads for [facility] which we discussed.  He does not feel ready to go further with that at this time. ?CXR 03/31/21-  ?IMPRESSION: ?1. No acute abnormality of the lungs. ?2. Hiatal hernia. ? ?ROS-see HPI  + = positive ?Constitutional:    weight loss, night sweats, fevers, chills, fatigue, lassitude. ?HEENT:    headaches, +difficulty swallowing, tooth/dental problems, sore throat,  ?     +sneezing, itching, ear ache, +nasal congestion, post nasal drip, snoring ?CV:    chest pain, orthopnea, PND, swelling in lower extremities, anasarca,                                 ?  dizziness,  +palpitations ?Resp:   shortness of breath with exertion or at rest.   ?             productive cough,   +non-productive cough, coughing up of blood.   ?           change in color of mucus.  wheezing.   ?Skin:    rash or lesions. ?GI:  + heartburn, indigestion, abdominal pain, nausea, vomiting, diarrhea,  ?               change in bowel habits, loss of appetite ?GU: dysuria, change in color of urine, no urgency or frequency.   flank pain. ?MS:   joint pain, stiffness, decreased range of motion, back pain. ?Neuro-     nothing unusual ?Psych:  change in mood or affect.  depression or anxiety.   memory loss. ? ?OBJ- Physical Exam ?General- Alert, Oriented, Affect-appropriate, Distress- none acute, not obese ?Skin- rash-none, lesions- none, excoriation- none ?Lymphadenopathy- none ?Head- atraumatic ?           Eyes- Gross vision intact, PERRLA, conjunctivae and secretions clear ?           Ears- Hearing, canals-normal ?           Nose- Clear, no-Septal dev, mucus, polyps, erosion, perforation  ?           Throat- Mallampati III , mucosa clear , drainage- none, tonsils- atrophic ?Neck- flexible , trachea midline,  no stridor , thyroid nl, carotid no bruit ?Chest - symmetrical excursion , unlabored ?          Heart/CV- RRR , no murmur , no gallop  , no rub, nl s1 s2 ?                          - JVD- none , edema- none, stasis changes- none, varices- none ?          Lung- clear to P&A, wheeze- none, cough+slight , dullness-none, rub- none ?          Chest wall-  ?Abd-  ?Br/ Gen/ Rectal- Not done, not indicated ?Extrem-  ?Neuro- grossly intact to observation ? ? ?

## 2021-12-06 ENCOUNTER — Ambulatory Visit (INDEPENDENT_AMBULATORY_CARE_PROVIDER_SITE_OTHER): Payer: Medicare Other | Admitting: Internal Medicine

## 2021-12-06 ENCOUNTER — Encounter: Payer: Self-pay | Admitting: Internal Medicine

## 2021-12-06 DIAGNOSIS — K219 Gastro-esophageal reflux disease without esophagitis: Secondary | ICD-10-CM | POA: Diagnosis not present

## 2021-12-06 DIAGNOSIS — G4733 Obstructive sleep apnea (adult) (pediatric): Secondary | ICD-10-CM

## 2021-12-06 NOTE — Assessment & Plan Note (Signed)
Benefits from CPAP with good compliance and control Plan- continue auto 5-15 

## 2021-12-06 NOTE — Patient Instructions (Signed)
We can continue CPAP auto 5-15 ? ?If you have other questions or concerns please let us hear. ?

## 2021-12-06 NOTE — Assessment & Plan Note (Signed)
Encourage continued reflux precautions ?

## 2021-12-29 DIAGNOSIS — H353221 Exudative age-related macular degeneration, left eye, with active choroidal neovascularization: Secondary | ICD-10-CM | POA: Diagnosis not present

## 2022-01-05 DIAGNOSIS — H353211 Exudative age-related macular degeneration, right eye, with active choroidal neovascularization: Secondary | ICD-10-CM | POA: Diagnosis not present

## 2022-01-18 ENCOUNTER — Other Ambulatory Visit: Payer: Self-pay | Admitting: Internal Medicine

## 2022-01-18 DIAGNOSIS — I471 Supraventricular tachycardia: Secondary | ICD-10-CM

## 2022-01-18 DIAGNOSIS — E039 Hypothyroidism, unspecified: Secondary | ICD-10-CM

## 2022-01-26 DIAGNOSIS — H353221 Exudative age-related macular degeneration, left eye, with active choroidal neovascularization: Secondary | ICD-10-CM | POA: Diagnosis not present

## 2022-02-15 DIAGNOSIS — H353211 Exudative age-related macular degeneration, right eye, with active choroidal neovascularization: Secondary | ICD-10-CM | POA: Diagnosis not present

## 2022-03-06 DIAGNOSIS — H353221 Exudative age-related macular degeneration, left eye, with active choroidal neovascularization: Secondary | ICD-10-CM | POA: Diagnosis not present

## 2022-03-20 DIAGNOSIS — H35363 Drusen (degenerative) of macula, bilateral: Secondary | ICD-10-CM | POA: Diagnosis not present

## 2022-03-20 DIAGNOSIS — H01005 Unspecified blepharitis left lower eyelid: Secondary | ICD-10-CM | POA: Diagnosis not present

## 2022-03-20 DIAGNOSIS — H0015 Chalazion left lower eyelid: Secondary | ICD-10-CM | POA: Diagnosis not present

## 2022-03-20 DIAGNOSIS — H353231 Exudative age-related macular degeneration, bilateral, with active choroidal neovascularization: Secondary | ICD-10-CM | POA: Diagnosis not present

## 2022-03-29 DIAGNOSIS — H01005 Unspecified blepharitis left lower eyelid: Secondary | ICD-10-CM | POA: Diagnosis not present

## 2022-03-29 DIAGNOSIS — H11432 Conjunctival hyperemia, left eye: Secondary | ICD-10-CM | POA: Diagnosis not present

## 2022-03-29 DIAGNOSIS — H353211 Exudative age-related macular degeneration, right eye, with active choroidal neovascularization: Secondary | ICD-10-CM | POA: Diagnosis not present

## 2022-03-29 DIAGNOSIS — H0015 Chalazion left lower eyelid: Secondary | ICD-10-CM | POA: Diagnosis not present

## 2022-04-24 DIAGNOSIS — H18511 Endothelial corneal dystrophy, right eye: Secondary | ICD-10-CM | POA: Diagnosis not present

## 2022-04-24 DIAGNOSIS — H353231 Exudative age-related macular degeneration, bilateral, with active choroidal neovascularization: Secondary | ICD-10-CM | POA: Diagnosis not present

## 2022-04-24 DIAGNOSIS — H353211 Exudative age-related macular degeneration, right eye, with active choroidal neovascularization: Secondary | ICD-10-CM | POA: Diagnosis not present

## 2022-04-24 DIAGNOSIS — H53411 Scotoma involving central area, right eye: Secondary | ICD-10-CM | POA: Diagnosis not present

## 2022-04-24 DIAGNOSIS — H5319 Other subjective visual disturbances: Secondary | ICD-10-CM | POA: Diagnosis not present

## 2022-04-24 DIAGNOSIS — H353221 Exudative age-related macular degeneration, left eye, with active choroidal neovascularization: Secondary | ICD-10-CM | POA: Diagnosis not present

## 2022-05-01 DIAGNOSIS — H35453 Secondary pigmentary degeneration, bilateral: Secondary | ICD-10-CM | POA: Diagnosis not present

## 2022-05-01 DIAGNOSIS — H35363 Drusen (degenerative) of macula, bilateral: Secondary | ICD-10-CM | POA: Diagnosis not present

## 2022-05-01 DIAGNOSIS — Z961 Presence of intraocular lens: Secondary | ICD-10-CM | POA: Diagnosis not present

## 2022-05-01 DIAGNOSIS — H353231 Exudative age-related macular degeneration, bilateral, with active choroidal neovascularization: Secondary | ICD-10-CM | POA: Diagnosis not present

## 2022-05-03 DIAGNOSIS — D225 Melanocytic nevi of trunk: Secondary | ICD-10-CM | POA: Diagnosis not present

## 2022-05-03 DIAGNOSIS — L570 Actinic keratosis: Secondary | ICD-10-CM | POA: Diagnosis not present

## 2022-05-03 DIAGNOSIS — L821 Other seborrheic keratosis: Secondary | ICD-10-CM | POA: Diagnosis not present

## 2022-05-09 ENCOUNTER — Other Ambulatory Visit (INDEPENDENT_AMBULATORY_CARE_PROVIDER_SITE_OTHER): Payer: Medicare Other

## 2022-05-09 DIAGNOSIS — Z23 Encounter for immunization: Secondary | ICD-10-CM

## 2022-05-10 DIAGNOSIS — H353211 Exudative age-related macular degeneration, right eye, with active choroidal neovascularization: Secondary | ICD-10-CM | POA: Diagnosis not present

## 2022-05-18 DIAGNOSIS — Z23 Encounter for immunization: Secondary | ICD-10-CM | POA: Diagnosis not present

## 2022-05-22 DIAGNOSIS — H353232 Exudative age-related macular degeneration, bilateral, with inactive choroidal neovascularization: Secondary | ICD-10-CM | POA: Diagnosis not present

## 2022-05-22 DIAGNOSIS — H5201 Hypermetropia, right eye: Secondary | ICD-10-CM | POA: Diagnosis not present

## 2022-05-22 DIAGNOSIS — Z961 Presence of intraocular lens: Secondary | ICD-10-CM | POA: Diagnosis not present

## 2022-05-22 DIAGNOSIS — H5212 Myopia, left eye: Secondary | ICD-10-CM | POA: Diagnosis not present

## 2022-06-03 IMAGING — DX DG CHEST 2V
2 series · 2 of 2 positions shown · non-contrast
Comparison: 05/18/2014

CLINICAL DATA: Cough, shortness of breath, rule out new

EXAM:
CHEST - 2 VIEW

[chest pa]
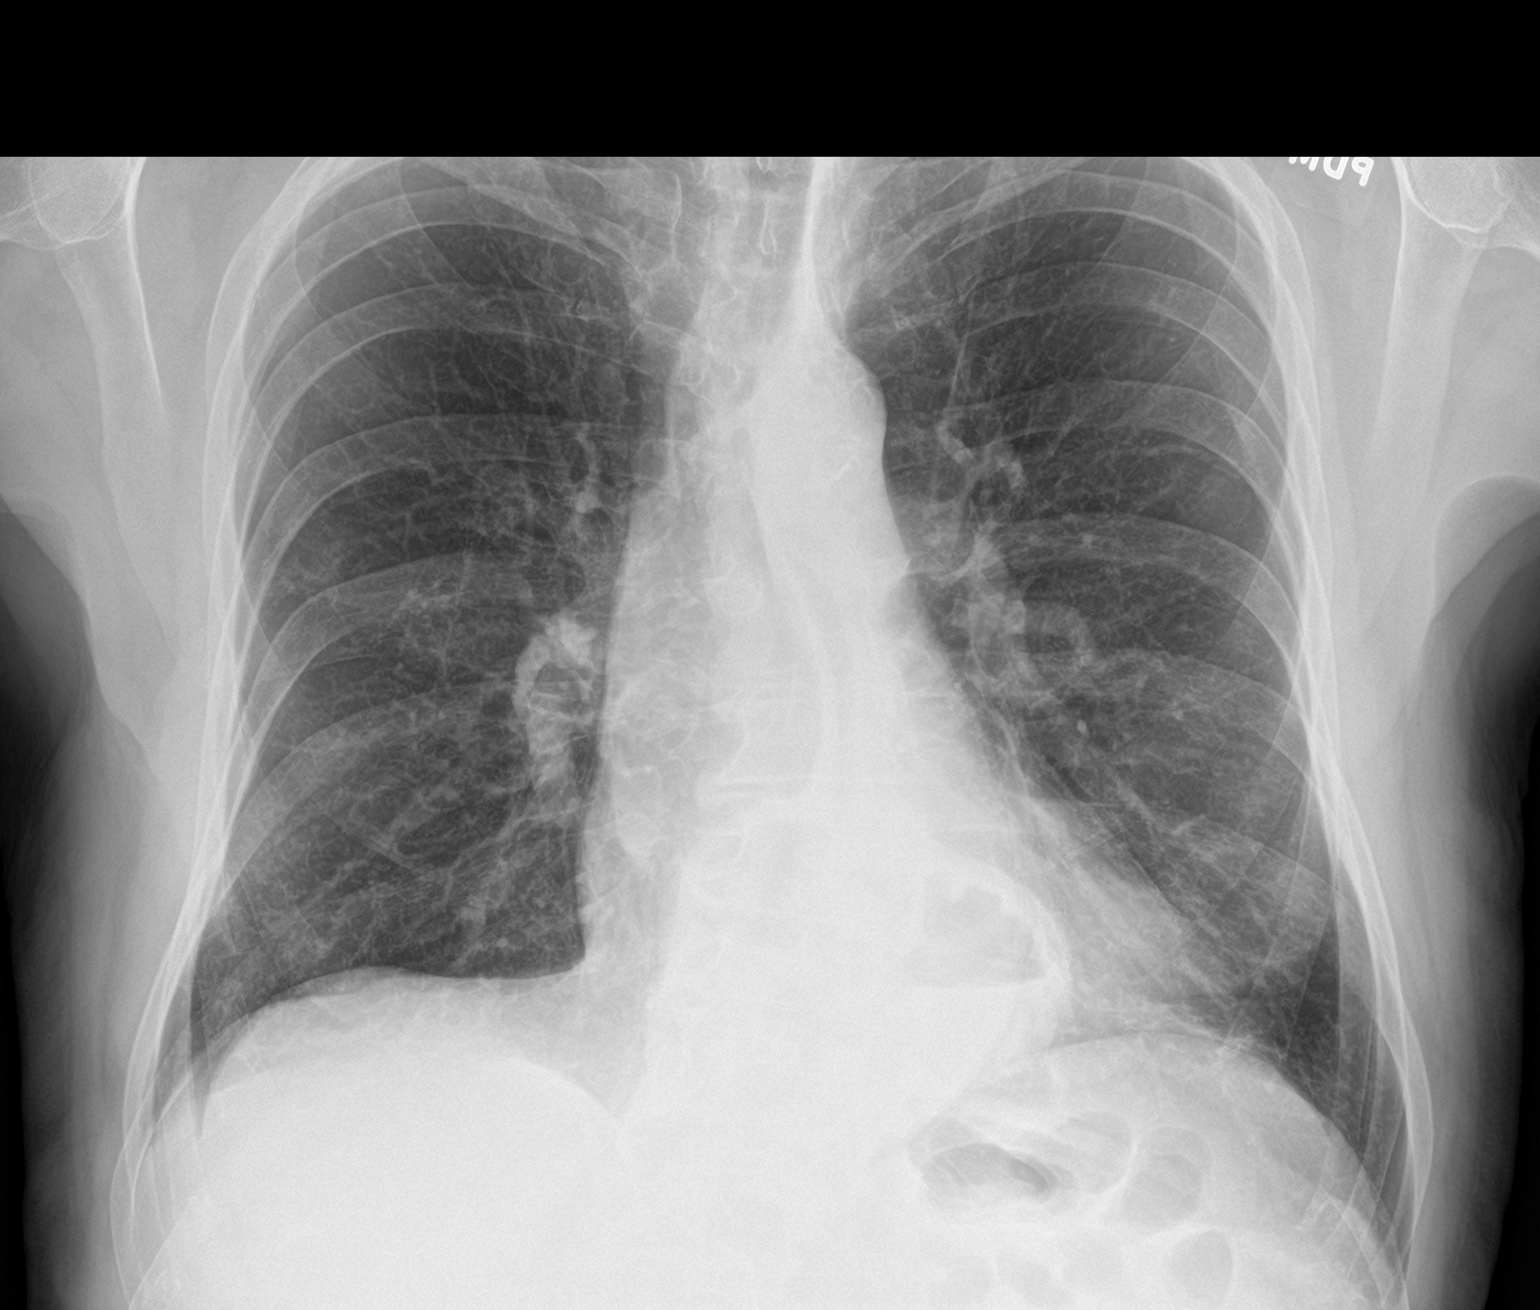

[chest lat]
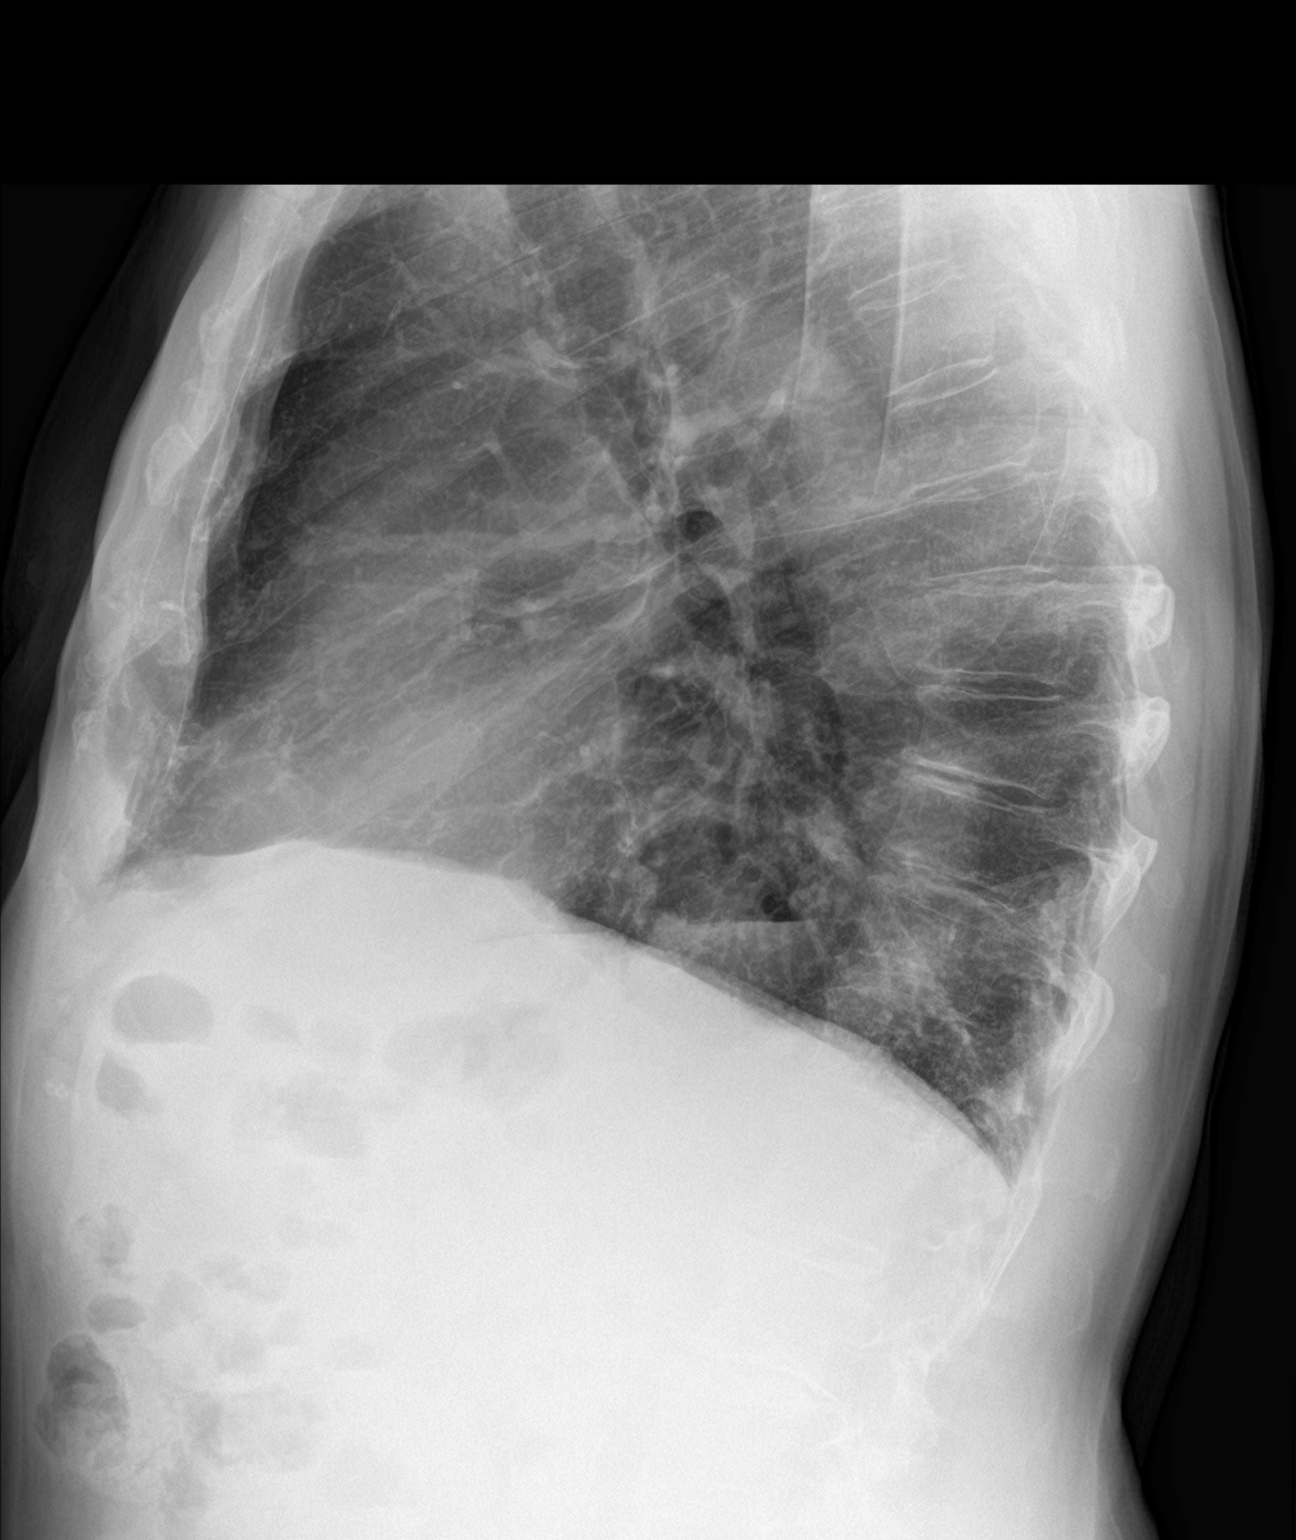

[2 of 2 positions shown; findings below may reference images not displayed]

FINDINGS: The heart is normal in size. Hiatal hernia. Both lungs are clear.
Disc degenerative disease of the thoracic spine.
IMPRESSION: 1. No acute abnormality of the lungs.
2. Hiatal hernia.

## 2022-06-05 ENCOUNTER — Other Ambulatory Visit (INDEPENDENT_AMBULATORY_CARE_PROVIDER_SITE_OTHER): Payer: Medicare Other

## 2022-06-05 DIAGNOSIS — E785 Hyperlipidemia, unspecified: Secondary | ICD-10-CM

## 2022-06-05 LAB — LIPID PANEL
Cholesterol: 181 mg/dL (ref 0–200)
HDL: 49.2 mg/dL (ref >39.00)
LDL Cholesterol: 107 mg/dL — ABNORMAL HIGH (ref 0–99)
NonHDL: 131.57
Total CHOL/HDL Ratio: 4
Triglycerides: 123 mg/dL (ref 0.0–149.0)
VLDL: 24.6 mg/dL (ref 0.0–40.0)

## 2022-06-13 DIAGNOSIS — H353221 Exudative age-related macular degeneration, left eye, with active choroidal neovascularization: Secondary | ICD-10-CM | POA: Diagnosis not present

## 2022-06-21 ENCOUNTER — Other Ambulatory Visit: Payer: Self-pay | Admitting: Internal Medicine

## 2022-06-21 DIAGNOSIS — E039 Hypothyroidism, unspecified: Secondary | ICD-10-CM

## 2022-06-21 DIAGNOSIS — I471 Supraventricular tachycardia, unspecified: Secondary | ICD-10-CM

## 2022-06-28 DIAGNOSIS — H353211 Exudative age-related macular degeneration, right eye, with active choroidal neovascularization: Secondary | ICD-10-CM | POA: Diagnosis not present

## 2022-07-26 ENCOUNTER — Telehealth: Payer: Self-pay | Admitting: *Deleted

## 2022-07-26 ENCOUNTER — Encounter: Payer: Self-pay | Admitting: *Deleted

## 2022-07-26 NOTE — Patient Instructions (Signed)
Visit Information  Thank you for taking time to visit with me today. Please don't hesitate to contact me if I can be of assistance to you.   Following are the goals we discussed today:   Goals Addressed             This Visit's Progress    COMPLETED: Care coordination activity       Care Coordination Interventions: Reviewed medications with patient and discussed adherence with no needed refills Reviewed scheduled/upcoming provider appointments including sufficient transportation source Assessed social determinant of health barriers Educated  on care management services utilizing social workers, pharmacy and nurse care manager for any ongoing diease management issues. Pt denies any needs at this time.         Please call the care guide team at [phone] if you need to cancel or reschedule your appointment.   If you are experiencing a Mental Health or [place] or need someone to talk to, please call the Suicide and Crisis Lifeline: 988  Patient verbalizes understanding of instructions and care plan provided today and agrees to view in [place]. Active MyChart status and patient understanding of how to access instructions and care plan via MyChart confirmed with patient.     No further follow up required: No follow up needs  Raina Mina, RN Care Management Coordinator [place] Office [phone]

## 2022-07-26 NOTE — Patient Outreach (Signed)
  Care Coordination   Initial Visit Note   07/26/2022 Name: [name] MRN: [id number] DOB: 1935/03/08  [name] is a 87 y.o. year old male who sees [name], [name] Halsted, MD for primary care. I spoke with  [name] by phone today.  What matters to the patients health and wellness today?  No needs    Goals Addressed             This Visit's Progress    COMPLETED: Care coordination activity       Care Coordination Interventions: Reviewed medications with patient and discussed adherence with no needed refills Reviewed scheduled/upcoming provider appointments including sufficient transportation source Assessed social determinant of health barriers Educated  on care management services utilizing social workers, pharmacy and nurse care manager for any ongoing diease management issues. Pt denies any needs at this time.         SDOH assessments and interventions completed:  Yes  SDOH Interventions Today    Flowsheet Row Most Recent Value  SDOH Interventions   Food Insecurity Interventions Intervention Not Indicated  Housing Interventions Intervention Not Indicated  Transportation Interventions Intervention Not Indicated  Utilities Interventions Intervention Not Indicated        Care Coordination Interventions:  Yes, provided   Follow up plan: No further intervention required.   Encounter Outcome:  Pt. Visit Completed   [name] Mina, RN Care Management Coordinator [place] Office [phone]

## 2022-08-01 DIAGNOSIS — H353221 Exudative age-related macular degeneration, left eye, with active choroidal neovascularization: Secondary | ICD-10-CM | POA: Diagnosis not present

## 2022-08-09 DIAGNOSIS — H353211 Exudative age-related macular degeneration, right eye, with active choroidal neovascularization: Secondary | ICD-10-CM | POA: Diagnosis not present

## 2022-09-13 DIAGNOSIS — H353211 Exudative age-related macular degeneration, right eye, with active choroidal neovascularization: Secondary | ICD-10-CM | POA: Diagnosis not present

## 2022-09-26 DIAGNOSIS — H353221 Exudative age-related macular degeneration, left eye, with active choroidal neovascularization: Secondary | ICD-10-CM | POA: Diagnosis not present

## 2022-10-18 DIAGNOSIS — H353211 Exudative age-related macular degeneration, right eye, with active choroidal neovascularization: Secondary | ICD-10-CM | POA: Diagnosis not present

## 2022-10-31 ENCOUNTER — Inpatient Hospital Stay: Payer: Medicare Other

## 2022-10-31 ENCOUNTER — Inpatient Hospital Stay: Payer: Medicare Other | Admitting: Genetic Counselor

## 2022-10-31 ENCOUNTER — Other Ambulatory Visit: Payer: Self-pay

## 2022-10-31 DIAGNOSIS — Z8481 Family history of carrier of genetic disease: Secondary | ICD-10-CM

## 2022-10-31 DIAGNOSIS — Z8042 Family history of malignant neoplasm of prostate: Secondary | ICD-10-CM

## 2022-10-31 DIAGNOSIS — Z86010 Personal history of colonic polyps: Secondary | ICD-10-CM | POA: Diagnosis not present

## 2022-10-31 DIAGNOSIS — Z803 Family history of malignant neoplasm of breast: Secondary | ICD-10-CM

## 2022-10-31 LAB — GENETIC SCREENING ORDER

## 2022-11-02 ENCOUNTER — Encounter: Payer: Self-pay | Admitting: Genetic Counselor

## 2022-11-02 DIAGNOSIS — Z8481 Family history of carrier of genetic disease: Secondary | ICD-10-CM | POA: Insufficient documentation

## 2022-11-02 HISTORY — DX: Family history of carrier of genetic disease: Z84.81

## 2022-11-02 NOTE — Progress Notes (Signed)
REFERRING PROVIDER:  Self-referred    PRIMARY PROVIDER:   Isaac [name], [name] Halsted, MD     PRIMARY REASON FOR VISIT:   Encounter Diagnoses  Name Primary?   Family history of BRCA2 gene positive Yes   Family history of breast cancer    Family history of prostate cancer      HISTORY OF PRESENT ILLNESS:    [name], an 88yo male, was seen for a [place] cancer genetics consultation due to a family history of breast cancer and known pathogenic BRCA2 variant in his [name]. [name] presents to clinic today to discuss the possibility of a hereditary predisposition to cancer, to discuss genetic testing, and to further clarify future cancer risks, as well as potential cancer risks for family members.    [name] is an 88yo male with no personal history of cancer.   RISK FACTORS Benign prostate hypertrophy.  Regular PSA screening.  History of two colon polyps at age 57.  Most recent colonoscopy 2018.  Dermatology screening.    FAMILY HISTORY:   We obtained a detailed, 4-generation family history.  Significant diagnoses are listed below:  Family History  Problem Relation Age of Onset   Breast cancer Mother 69   Breast cancer Maternal Grandmother        dx 26 and 2   Breast cancer [name] 59       BRCA2 positive   Prostate cancer Son 60          [name]'s mother and maternal grandmother were diagnosed with breast cancer at age 38 and 29, respectively. [name]'s [name] was diagnosed with breast cancer at age 69, and subsequently tested positive for BRCA2 [id number]_779del (p.Glu260Serfs*15) on the [facility] Multi-Cancer (70 gene) panel. [name]'s son was diagnosed with prostate cancer at age 93, and reportedly tested negative for the familial BRCA2 variant found in his sister.  His son's genetic testing  records were not available for review at this time. [name] is unaware of any cancer history in his father or paternal uncles and cousins but does not keep in touch with this side  of his family.   [name]'s wife and mother of [name], who also has a personal history of breast cancer, received genetic testing after her [name]'s positive results and tested negative for BRCA2.     GENETIC COUNSELING ASSESSMENT: [name] is an 88yo male with a family history of a known BRCA2 mutation in his [name]. Subsequent genetic testing in [name]'s wife was negative for the familial BRCA2 variant. Given [name]'s family history of breast cancer and his wife's negative result, there is a high chance that [name] also carries the BRCA2 mutation found in his [name].  We, therefore, discussed and recommended the following at today's visit.      DISCUSSION: We discussed that 5-10% of breast cancer is hereditary, with most cases of hereditary breast cancer associated with BRCA1/BRCA2. Pathogenic mutations in the BRCA2 gene is associated with an increased risk for not only breast cancer but also ovarian, prostate, and pancreatic cancer as well as an increased risk for melanoma. We reviewed the management recommendations for males who carry a BRCA2 mutation including clinical breast exams for males, PSA and digital rectal exams, and dermatology screenings. There are other genes that can be associated with hereditary cancer syndromes. We discussed that testing is beneficial for several reasons, including knowing about other cancer risks and understanding if other family members could be at risk for cancer and allowing them to undergo genetic  testing.   We reviewed the characteristics, features and inheritance patterns of hereditary cancer syndromes. We also discussed genetic testing, including the appropriate family members to test, the process of testing, insurance coverage and turn-around-time for results. We discussed the implications of a negative, positive, and variant of uncertain significant result.   [name] was offered targeted BRCA2 familial variant testing and an expanded  pan-cancer panel (70 genes). [name] was informed of the benefits and limitations of each genetic testing option, including that expanded pan-cancer panels contain several genes that do not have clear management guidelines at this point in time. ?We discussed [name]'s limited knowledge of his paternal family members' cancer history, and the fact that to his knowledge none of these relatives have had genetic testing. We also discussed that as the number of genes included on a panel increases, the chances of variants of uncertain significance increases. ?After considering the benefits and limitations of each gene panel, [name] elected to have an expanded pan-cancer panel (70 genes) through [facility].   The [facility] Multi-Cancer (70 gene) panel includes the following genes: AIP, ALK, APC, ATM, AXIN2, BAP1, BARD1, BLM, BMPR1A, BRCA1, BRCA2, BRIP1, CDC73, CDH1, CDK4, CDKN1B, CDKN2A, CHEK2, CTNNA1, DICER1, EGFR, EPCAM, FH, FLCN, GREM1, HOXB13, KIT, LZTR1, MAX, MBD4, MEN1, MET, MITF, MLH1, MSH2, MSH3, MSH6, MUTYH, NF1, NF2, NTHL1, PALB2, PDGFRA, PMS2, POLD1, POLE, POT1, PRKAR1A, PTCH1, PTEN, RAD51C, RAD51D, RB1, RET, SDHA, SDHAF2, SDHB, SDHC, SDHD, SMAD4, SMARCA4, SMARCB1, SMARCE1, STK11, SUFU, TMEM127, TP53, TSC1, TSC2, VHL.    Based on [name] family history of cancer, he meets medical criteria for panel-based genetic testing.   We discussed the Genetic Information Non-Discrimination Act (GINA) of 2008, which helps protect individuals against genetic discrimination based on their genetic test results.  It impacts both health insurance and employment.  With health insurance, it protects against genetic test results being used for increased premiums or policy termination. For employment, it protects against hiring, firing and promoting decisions based on genetic test results.  GINA does not apply to those in the [employer], those who work for companies with less than 15 employees, and new life insurance or long-term  disability insurance policies.  Health status due to a cancer diagnosis is not protected under GINA.     PLAN: After considering the risks, benefits, and limitations, Hyun provided informed consent to pursue genetic testing and the blood sample was sent to [facility] for analysis of the Multi-Cancer +RNA panel. Results should be available within approximately 2-3 weeks' time, at which point they will be disclosed by telephone to Tylar, as will any additional recommendations warranted by these results. Darell will receive a summary of his genetic counseling visit and a copy of his results once available. This information will also be available in Epic.      Quintez's questions were answered to his satisfaction today. Our contact information was provided should additional questions or concerns arise. Thank you for the referral and allowing [place] to share in the care of your patient.    Yang Rack M. Joette Catching, [place], [facility]  Genetic Counselor  [email or web address]  (P) [phone]      The patient was seen for a total of 40 minutes in face-to-face genetic counseling.  The patient was accompanied by his wife.  Drs. Lindi Adie and/or Burr Medico were available to discuss this case as needed.    _______________________________________________________________________   For Office Staff:   Number of people involved in session: 2  Was an Intern/ student involved with case: yes; Contractor, [employer]  Salomon Fick, conducted session under my direct supervision

## 2022-11-06 DIAGNOSIS — H5315 Visual distortions of shape and size: Secondary | ICD-10-CM | POA: Diagnosis not present

## 2022-11-06 DIAGNOSIS — H353231 Exudative age-related macular degeneration, bilateral, with active choroidal neovascularization: Secondary | ICD-10-CM | POA: Diagnosis not present

## 2022-11-06 DIAGNOSIS — Z961 Presence of intraocular lens: Secondary | ICD-10-CM | POA: Diagnosis not present

## 2022-11-06 DIAGNOSIS — H35453 Secondary pigmentary degeneration, bilateral: Secondary | ICD-10-CM | POA: Diagnosis not present

## 2022-11-06 DIAGNOSIS — H35363 Drusen (degenerative) of macula, bilateral: Secondary | ICD-10-CM | POA: Diagnosis not present

## 2022-11-20 ENCOUNTER — Encounter: Payer: Self-pay | Admitting: Genetic Counselor

## 2022-11-20 ENCOUNTER — Telehealth: Payer: Self-pay | Admitting: Genetic Counselor

## 2022-11-20 DIAGNOSIS — Z1501 Genetic susceptibility to malignant neoplasm of breast: Secondary | ICD-10-CM

## 2022-11-20 DIAGNOSIS — Z1379 Encounter for other screening for genetic and chromosomal anomalies: Secondary | ICD-10-CM | POA: Insufficient documentation

## 2022-11-20 DIAGNOSIS — Z1509 Genetic susceptibility to other malignant neoplasm: Secondary | ICD-10-CM

## 2022-11-20 HISTORY — DX: Genetic susceptibility to other malignant neoplasm: Z15.01

## 2022-11-20 NOTE — Telephone Encounter (Signed)
Contacted patient in attempt to disclose results of genetic testing.  LVM with contact information requesting a call back.  

## 2022-11-21 ENCOUNTER — Encounter: Payer: Self-pay | Admitting: Genetic Counselor

## 2022-11-21 ENCOUNTER — Ambulatory Visit: Payer: Self-pay | Admitting: Genetic Counselor

## 2022-11-21 DIAGNOSIS — H353221 Exudative age-related macular degeneration, left eye, with active choroidal neovascularization: Secondary | ICD-10-CM | POA: Diagnosis not present

## 2022-11-21 DIAGNOSIS — Z1379 Encounter for other screening for genetic and chromosomal anomalies: Secondary | ICD-10-CM

## 2022-11-21 DIAGNOSIS — Z8481 Family history of carrier of genetic disease: Secondary | ICD-10-CM

## 2022-11-21 DIAGNOSIS — Z803 Family history of malignant neoplasm of breast: Secondary | ICD-10-CM

## 2022-11-21 DIAGNOSIS — Z1501 Genetic susceptibility to malignant neoplasm of breast: Secondary | ICD-10-CM

## 2022-11-21 DIAGNOSIS — Z8042 Family history of malignant neoplasm of prostate: Secondary | ICD-10-CM

## 2022-11-21 NOTE — Progress Notes (Signed)
GENETIC TEST RESULTS  Patient Name: [name] Patient Age: 88 y.o. Encounter Date: 11/21/2022   Mr. Vasek was seen in the Cancer Genetics clinic on October 31, 2022 due to a family history of BRCA2 mutation in his daughter. Please refer to the prior Genetics clinic note for more information regarding Mr. Vavra medical and family histories and our assessment at the time.   FAMILY HISTORY:   We obtained a detailed, 4-generation family history.  Significant diagnoses are listed below:       Family History  Problem Relation Age of Onset   Breast cancer Mother 58   Breast cancer Maternal Grandmother          dx 12 and 45   Breast cancer Daughter 65        BRCA2 positive   Prostate cancer Son 16               Octavius's mother and maternal grandmother were diagnosed with breast cancer at age 30 and 58, respectively. Dianne's daughter was diagnosed with breast cancer at age 25, and subsequently tested positive for BRCA2 c.778_779del (p.Glu260Serfs*15) on the [facility] Multi-Cancer (70 gene) panel. Leovanni's son was diagnosed with prostate cancer at age 45, and reportedly tested negative for the familial BRCA2 variant found in his sister.  His son's genetic testing  records were not available for review at this time. Kyian is unaware of any cancer history in his father or paternal uncles and cousins but does not keep in touch with this side of his family.    Daily's wife and mother of Corney's daughter, who also has a personal history of breast cancer, received genetic testing after her daughter's positive results and tested negative for BRCA2.    GENETIC TESTING Mr. Harjo tested positive for a single pathogenic variant in the BRCA2 gene. 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  This is the same pathogenic variant that was previously detected in his daughter.   No other pathogenic variants detected in [facility] Multi-Cancer +RNA Panel.  The Multi-Cancer + RNA Panel  offered by [facility] includes sequencing and/or deletion/duplication analysis of the following 70 genes:  AIP*, ALK, APC*, ATM*, AXIN2*, BAP1*, BARD1*, BLM*, BMPR1A*, BRCA1*, BRCA2*, BRIP1*, CDC73*, CDH1*, CDK4, CDKN1B*, CDKN2A, CHEK2*, CTNNA1*, DICER1*, EPCAM (del/dup only), EGFR, FH*, FLCN*, GREM1 (promoter dup only), HOXB13, KIT, LZTR1, MAX*, MBD4, MEN1*, MET, MITF, MLH1*, MSH2*, MSH3*, MSH6*, MUTYH*, NF1*, NF2*, NTHL1*, PALB2*, PDGFRA, PMS2*, POLD1*, POLE*, POT1*, PRKAR1A*, PTCH1*, PTEN*, RAD51C*, RAD51D*, RB1*, RET, SDHA* (sequencing only), SDHAF2*, SDHB*, SDHC*, SDHD*, SMAD4*, SMARCA4*, SMARCB1*, SMARCE1*, STK11*, SUFU*, TMEM127*, TP53*, TSC1*, TSC2*, VHL*. RNA analysis is performed for * genes.   The test report has been scanned into EPIC and is located under the Molecular Pathology section of the Results Review tab.  A portion of the result report is included below for reference. Genetic testing reported out on November 10, 2022.     Clinical Information: Hereditary breast and ovarian cancer (HBOC) syndrome is characterized by an increased lifetime risk for, generally, adult-onset cancers including, breast, contralateral breast, male breast, ovarian, prostate, melanoma and pancreatic.  The cancers associated with BRCA2 are: Male breast cancer, up to an 84% risk Increased chance of contralateral breast cancer in females with a history of breast cancer Male breast cancer, up to an 8% risk Ovarian cancer, 13-29% risk Pancreatic cancer, 5-10% risk Prostate cancer, 19-61% risk Melanoma, elevated risk   Management Recommendations:  Breast Screening/Risk Reduction:  Females:  Breast awareness starting at age 51  Clinical breast examination every 6-12 months starting at age 56  Breast cancer screening: Age 73-29 years, annual breast MRI with and without contrast (or mammogram, if MRI is unavailable), although the age to initiate screening may be individualized based on family history Age  73-75 years, annual mammogram and breast MRI with and without contrast Age >75 years, management should be considered on an individual basis For women with a BRCA2 pathogenic or likely pathogenic variant who are treated for breast cancer and have not had a bilateral mastectomy, screening with annual mammogram and breast MRI should continue as described above. The option of prophylactic bilateral risk-reducing mastectomy (RRM), removal of the breast tissue before cancer develops, is the best option for significantly decreasing the risk of developing breast cancer. Studies have shown mastectomies reduce the risk of breast cancer by 90-95% in women with a BRCA2 mutation.   Males: Breast self-exam training and education starting at age 34 years Annual clinical breast exam starting at age 28 years  Consider annual mammogram starting at age 10 or 10 years before the earliest known male breast cancer in the family (whichever comes first).   Gynecological Cancer Screening/Risk Reduction: It is recommended that women with a BRCA2 mutation have a risk-reducing salpingo oophorectomy (RRSO), removal of the ovaries and fallopian tubes. It is reasonable to delay RRSO until age 57-45 years unless age at diagnosis in the family warrants earlier age for consideration of RRSO.  Having a RRSO is estimated to reduce the risk of ovarian cancer by up to 96%. There is still a small risk of developing an "ovarian-like" cancer in the lining of the abdomen, called the peritoneum. Another benefit to having the ovaries removed is the risk reduction for breast cancer. If the ovaries are removed before menopause, the risk of developing breast cancer is reduced. Ovarian cancer screening is an option for women who chose not to have a RRSO or who have not yet completed their family. Current screening methods for ovarian cancer are neither sensitive nor specific, meaning that often early stage ovarian cancer cannot be diagnosed through  this screening.  Screening can also be falsely positive with no cancer present. For this reason, RRSO is recommended over screening. If ovarian cancer screening is recommended by a physician, it could include: CA-125 blood tests Transvaginal ultrasounds Clinical pelvic exams   Skin Cancer Screening and Risk Reduction: Regular skin self-examinations Individuals should notify their physicians of any changes to moles such as increasing in size, darkening in color, or other change in appearance. Annual skin examinations by a dermatologist  Follow sun-safety recommendations such as: Using UVA and UVB 30 SPF or higher sunscreen Avoiding sunburns Limiting sun exposure, especially during the hours of 11am-4pm  Wearing protective clothing and sunglasses Avoid using tanning beds For more information about the prevention of melanoma visit [email or web address]   Prostate Cancer Screening: Annual digital rectal exam (DRE) at age 68 Annual PSA blood test at age 76  Pancreatic Cancer Screening/Risk Reduction: Avoid smoking, heavy alcohol use, and obesity. It has been suggested that pancreatic cancer screening be limited to those with a family history of pancreatic cancer (first- or second-degree relative). Ideally, screening should be performed in experienced centers utilizing a multidisciplinary approach under research conditions. Recommended screening could include annual endoscopic ultrasound (preferred) and/or MRI of the pancreas starting at age 21 or 22 years younger than the earliest age diagnosis in the family.  Additional Considerations: Recent studies have suggested PARP inhibitors may be a beneficial chemotherapeutic agent  for a subset of patients with BRCA2-associated breast, ovarian, prostate, and pancreatic cancers. Clinical trials are currently in process to determine if and how these agents can be useful in the treatment of BRCA2 cancer patients Patients of reproductive age should be made  aware of options for prenatal diagnosis and assisted reproduction including pre-implantation genetic diagnosis. Individuals with a single pathogenic BRCA2 variant are carriers of Fanconi anemia. Fanconi anemia is characterized by developmental delay apparent from infancy, short stature, microcephaly, and coarse dysmorphic features. For there to be a risk of Fanconi anemia in offspring, both the patient and their partner would each have to carry a pathogenic variant in BRCA2. In this case, the risk of having an affected child is 25%.    This information is based on current understanding of the gene and may change in the future.   Implications for Family Members: Hereditary predisposition to cancer due to pathogenic variants in the BRCA2 gene has autosomal dominant inheritance. This means that an individual with a pathogenic variant has a 50% chance of passing the condition on to his/her offspring. Identification of a pathogenic variant allows for the recognition of at-risk relatives who can pursue testing for the familial variant.  Family members are encouraged to consider genetic testing for this familial pathogenic variant. As there are generally no childhood cancer risks associated with a single pathogenic variant in the BRCA2 gene, individuals in the family are not recommended to have testing until they reach at least 86 years of age. They may contact our office at [phone] for more information or to schedule an appointment. Complimentary testing for the familial variant is available for 150 days after the genetic testing report date.  Family members who live outside of the area are encouraged to find a genetic counselor in their area by visiting: [email or web address].  Resources: FORCE (Facing Our Risk of Cancer Empowered) is a resource for those with a hereditary predisposition to develop cancer.  FORCE provides information about risk reduction, advocacy, legislation, and  clinical trials.  Additionally, FORCE provides a platform for collaboration and support; which includes: peer navigation, message boards, local support groups, a toll-free helpline, research registry and recruitment, advocate training, published medical research, webinars, brochures, mastectomy photos, and more.  For more information, visit www.facingourrisk.org   Plan: We discussed with Mr. Thibault that screening should be tailored to his personal medical history and family medical history.  Result sent to PCP (Dr. Chaya Jan) and dermatologist (Dr. Yetta Barre).  We discussed with Mr. Boateng that annual clinical chest wall exams, annual PSA screening, and annual dermatology exams may be appropriate.  Physical exam with PCP is scheduled for next week.  His children have already been tested for the BRCA2 gene mutation.  He does not have any siblings.  All other blood relatives of Mr. Briggs are deceased or out of contact.   Christl Fessenden M. Rennie Plowman, MS, [facility] Genetic Counselor [email or web address] (P) [phone]

## 2022-11-27 ENCOUNTER — Encounter: Payer: Self-pay | Admitting: Internal Medicine

## 2022-11-27 ENCOUNTER — Ambulatory Visit (INDEPENDENT_AMBULATORY_CARE_PROVIDER_SITE_OTHER): Payer: Medicare Other | Admitting: Internal Medicine

## 2022-11-27 VITALS — BP 130/70 | HR 63 | Temp 98.3°F | Ht 68.5 in | Wt 169.4 lb

## 2022-11-27 DIAGNOSIS — D649 Anemia, unspecified: Secondary | ICD-10-CM

## 2022-11-27 DIAGNOSIS — N400 Enlarged prostate without lower urinary tract symptoms: Secondary | ICD-10-CM

## 2022-11-27 DIAGNOSIS — E785 Hyperlipidemia, unspecified: Secondary | ICD-10-CM | POA: Diagnosis not present

## 2022-11-27 DIAGNOSIS — Z0000 Encounter for general adult medical examination without abnormal findings: Secondary | ICD-10-CM

## 2022-11-27 DIAGNOSIS — Z125 Encounter for screening for malignant neoplasm of prostate: Secondary | ICD-10-CM

## 2022-11-27 DIAGNOSIS — E039 Hypothyroidism, unspecified: Secondary | ICD-10-CM | POA: Diagnosis not present

## 2022-11-27 DIAGNOSIS — Z1501 Genetic susceptibility to malignant neoplasm of breast: Secondary | ICD-10-CM | POA: Diagnosis not present

## 2022-11-27 DIAGNOSIS — Z1509 Genetic susceptibility to other malignant neoplasm: Secondary | ICD-10-CM

## 2022-11-27 DIAGNOSIS — E559 Vitamin D deficiency, unspecified: Secondary | ICD-10-CM | POA: Diagnosis not present

## 2022-11-27 LAB — CBC WITH DIFFERENTIAL/PLATELET
Basophils Absolute: 0.1 10*3/uL (ref 0.0–0.1)
Basophils Relative: 1.4 % (ref 0.0–3.0)
Eosinophils Absolute: 0.1 10*3/uL (ref 0.0–0.7)
Eosinophils Relative: 2.6 % (ref 0.0–5.0)
HCT: 43.2 % (ref 39.0–52.0)
Hemoglobin: 14.2 g/dL (ref 13.0–17.0)
Lymphocytes Relative: 31.7 % (ref 12.0–46.0)
Lymphs Abs: 1.5 10*3/uL (ref 0.7–4.0)
MCHC: 32.9 g/dL (ref 30.0–36.0)
MCV: 86.8 fl (ref 78.0–100.0)
Monocytes Absolute: 0.7 10*3/uL (ref 0.1–1.0)
Monocytes Relative: 14.3 % — ABNORMAL HIGH (ref 3.0–12.0)
Neutro Abs: 2.3 10*3/uL (ref 1.4–7.7)
Neutrophils Relative %: 50 % (ref 43.0–77.0)
Platelets: 209 10*3/uL (ref 150.0–400.0)
RBC: 4.97 Mil/uL (ref 4.22–5.81)
RDW: 14.7 % (ref 11.5–15.5)
WBC: 4.7 10*3/uL (ref 4.0–10.5)

## 2022-11-27 LAB — LIPID PANEL
Cholesterol: 178 mg/dL (ref 0–200)
HDL: 48.5 mg/dL (ref >39.00)
LDL Cholesterol: 111 mg/dL — ABNORMAL HIGH (ref 0–99)
NonHDL: 129.16
Total CHOL/HDL Ratio: 4
Triglycerides: 93 mg/dL (ref 0.0–149.0)
VLDL: 18.6 mg/dL (ref 0.0–40.0)

## 2022-11-27 LAB — COMPREHENSIVE METABOLIC PANEL
ALT: 16 U/L (ref 0–53)
AST: 19 U/L (ref 0–37)
Albumin: 4 g/dL (ref 3.5–5.2)
Alkaline Phosphatase: 92 U/L (ref 39–117)
BUN: 13 mg/dL (ref 6–23)
CO2: 26 mEq/L (ref 19–32)
Calcium: 9.1 mg/dL (ref 8.4–10.5)
Chloride: 101 mEq/L (ref 96–112)
Creatinine, Ser: 0.97 mg/dL (ref 0.40–1.50)
GFR: 70.99 mL/min (ref >60.00)
Glucose, Bld: 101 mg/dL — ABNORMAL HIGH (ref 70–99)
Potassium: 4.3 mEq/L (ref 3.5–5.1)
Sodium: 135 mEq/L (ref 135–145)
Total Bilirubin: 0.5 mg/dL (ref 0.2–1.2)
Total Protein: 6.8 g/dL (ref 6.0–8.3)

## 2022-11-27 LAB — PSA: PSA: 0.79 ng/mL (ref 0.10–4.00)

## 2022-11-27 LAB — TSH: TSH: 1.03 u[IU]/mL (ref 0.35–5.50)

## 2022-11-27 LAB — VITAMIN D 25 HYDROXY (VIT D DEFICIENCY, FRACTURES): VITD: 49.7 ng/mL (ref 30.00–100.00)

## 2022-11-27 NOTE — Progress Notes (Signed)
Established Patient Office Visit     CC/Reason for Visit: Subsequent Medicare wellness visit  HPI: [name] is a 88 y.o. male who is coming in today for the above mentioned reasons.  He is feeling well and has no acute concerns.  He recently had genetic counseling was found to have BRCA2 mutation.  Daughter had breast cancer, son had breast cancer.  He was advised by the geneticist to simply continue routine prostate, breast and skin cancer screening.   Past Medical/Surgical History: Past Medical History:  Diagnosis Date   Anemia    BRCA2 gene mutation positive 11/20/2022   Family history of BRCA2 gene positive 11/02/2022   GERD (gastroesophageal reflux disease)    H/O hiatal hernia    Headache(784.0)    History of cystoscopy 1965   History of hernia repair    left side:1991and 1978 right side: 2009   Hx of benign prostatic hypertrophy    Hx of colonic polyps    Dr. Kinnie Scales removed 2 in 2009   Hyperlipidemia    Hypothyroidism    Macular degeneration    Shortness of breath    with exertion    Sleep apnea    2008 no CPAP machine    SVT (supraventricular tachycardia)    2002    Thyroid disease    hypothyroidism    Past Surgical History:  Procedure Laterality Date   bilateral hernia repair  1610,9604   borkne bones foot  88 yrs old   broken finger  88 yrs old   cystocopy  1965   EXCISION METACARPAL MASS Left 12/03/2020   Procedure: LEFT LONG FINGER EXCISION MUCOID CYST AND DEBRIDEMENT DISTAL INTERPHALANGEAL JOINT;  Surgeon: Betha Loa, MD;  Location: [facility];  Service: Orthopedics;  Laterality: Left;   HERNIA REPAIR  1991, 1998, 2009   LAPAROSCOPIC NISSEN FUNDOPLICATION  11/01/2011   Procedure: LAPAROSCOPIC NISSEN FUNDOPLICATION;  Surgeon: Mariella Saa, MD;  Location: [facility];  Service: General;  Laterality: N/A;  Laparoscopic Repair of Large Hiatal Hernia with Nissen Fundoplication with mesh    pneumonia  88 years old   skin cancer face  last ck on 08-22-07     THYROIDECTOMY  1967    Social History:  reports that he quit smoking about 44 years ago. His smoking use included cigarettes. He smoked an average of 1 pack per day. He has never used smokeless tobacco. He reports current alcohol use of about 1.0 standard drink of alcohol per week. He reports that he does not use drugs.  Allergies: No Known Allergies  Family History:  Family History  Problem Relation Age of Onset   Breast cancer Mother 74   Heart disease Mother    Dementia Mother 49   Thyroid disease Mother    Macular degeneration Mother    Heart disease Father    Hypertension Father    Heart failure Father    Dementia Father    Breast cancer Maternal Grandmother        dx 90 and 27   Breast cancer Daughter 78       BRCA2 positive   Prostate cancer Son 66     Current Outpatient Medications:    acetaminophen (TYLENOL) 500 MG tablet, Take 1,000 mg by mouth every 4 (four) hours as needed. For pain, Disp: , Rfl:    aspirin EC 81 MG tablet, Take 81 mg by mouth daily. Swallow whole., Disp: , Rfl:    Calcium Citrate-Vitamin  D (CALCIUM CITRATE+D3 PO), Take by mouth., Disp: , Rfl:    cetirizine (ZYRTEC) 10 MG tablet, Take 10 mg by mouth daily., Disp: , Rfl:    levothyroxine (SYNTHROID) 125 MCG tablet, TAKE 1 TABLET BY MOUTH EVERY MORNING, Disp: 90 tablet, Rfl: 1   metoprolol succinate (TOPROL-XL) 25 MG 24 hr tablet, TAKE 1 TABLET BY MOUTH DAILY, Disp: 90 tablet, Rfl: 1   Multiple Vitamins-Minerals (ICAPS AREDS 2 PO), Take by mouth., Disp: , Rfl:    omeprazole (PRILOSEC) 20 MG capsule, TAKE ONE CAPSULE BY MOUTH DAILY, Disp: 90 capsule, Rfl: 1   vitamin B-12 (CYANOCOBALAMIN) 1000 MCG tablet, Take 500 mcg by mouth daily. Patient states that he takes half tablet once a day, Disp: , Rfl:   Review of Systems:  Negative unless indicated in HPI.   Physical Exam: Vitals:   11/27/22 0908  BP: 130/70  Pulse: 63  Temp: 98.3 F (36.8 C)  TempSrc: Oral  SpO2: 97%   Weight: 169 lb 6.4 oz (76.8 kg)  Height: 5' 8.5" (1.74 m)    Body mass index is 25.38 kg/m.   Physical Exam Vitals reviewed.  Constitutional:      General: He is not in acute distress.    Appearance: Normal appearance. He is not ill-appearing, toxic-appearing or diaphoretic.  HENT:     Head: Normocephalic.     Right Ear: Tympanic membrane, ear canal and external ear normal. There is no impacted cerumen.     Left Ear: Tympanic membrane, ear canal and external ear normal. There is no impacted cerumen.     Nose: Nose normal.     Mouth/Throat:     Mouth: Mucous membranes are moist.     Pharynx: Oropharynx is clear. No oropharyngeal exudate or posterior oropharyngeal erythema.  Eyes:     General: No scleral icterus.       Right eye: No discharge.        Left eye: No discharge.     Conjunctiva/sclera: Conjunctivae normal.     Pupils: Pupils are equal, round, and reactive to light.  Neck:     Vascular: No carotid bruit.  Cardiovascular:     Rate and Rhythm: Normal rate and regular rhythm.     Pulses: Normal pulses.     Heart sounds: Normal heart sounds.  Pulmonary:     Effort: Pulmonary effort is normal. No respiratory distress.     Breath sounds: Normal breath sounds.  Abdominal:     General: Abdomen is flat. Bowel sounds are normal.     Palpations: Abdomen is soft.  Musculoskeletal:        General: Normal range of motion.     Cervical back: Normal range of motion.  Skin:    General: Skin is warm and dry.  Neurological:     General: No focal deficit present.     Mental Status: He is alert and oriented to person, place, and time. Mental status is at baseline.  Psychiatric:        Mood and Affect: Mood normal.        Behavior: Behavior normal.        Thought Content: Thought content normal.        Judgment: Judgment normal.      Subsequent Medicare wellness visit   1. Risk factors, based on past  M,S,F - Cardiac Risk Factors include: advanced age (>90men, >22  women)   2.  Physical activities: Dietary issues and exercise activities discussed:  Current Exercise Habits:  Home exercise routine, Type of exercise: walking, Time (Minutes): 40, Frequency (Times/Week): 7, Weekly Exercise (Minutes/Week): 280, Intensity: Moderate   3.  Depression/mood:  Flowsheet Row Office Visit from 11/24/2021 in [facility] Total Score 1        4.  ADL's:    11/23/2022    4:34 PM  In your present state of health, do you have any difficulty performing the following activities:  Hearing? 0  Vision? 0  Difficulty concentrating or making decisions? 0  Walking or climbing stairs? 0  Dressing or bathing? 0  Doing errands, shopping? 0  Preparing Food and eating ? N  Using the Toilet? N  In the past six months, have you accidently leaked urine? N  Do you have problems with loss of bowel control? N  Managing your Medications? N  Managing your Finances? N  Housekeeping or managing your Housekeeping? N     5.  Fall risk:     11/13/2019    7:26 AM 11/16/2020   10:09 AM 12/03/2020   12:07 PM 11/24/2021    9:12 AM 11/23/2022    4:37 PM  Fall Risk  Falls in the past year? 0 0  0 0  Was there an injury with Fall? 0 0  0 0  Fall Risk Category Calculator 0 0  0 0  Fall Risk Category (Retired) Low Low  Low   (RETIRED) Patient Fall Risk Level   Moderate fall risk Low fall risk   Patient at Risk for Falls Due to    No Fall Risks   Fall risk Follow up    Falls evaluation completed Falls evaluation completed     6.  Home safety: No problems identified   7.  Height weight, and visual acuity: height and weight as above, vision/hearing: Vision Screening   Right eye Left eye Both eyes  Without correction     With correction 20/63 20/32 20/40      8.  Counseling: Counseling given: Not Answered    9. Lab orders based on risk factors: Laboratory update will be reviewed   10. Cognitive assessment:        11/23/2022    4:38 PM   6CIT Screen  What Year? 0 points  What month? 0 points  What time? 0 points  Count back from 20 0 points  Months in reverse 2 points  Repeat phrase 0 points  Total Score 2 points     11. Screening: Patient provided with a written and personalized 5-10 year screening schedule in the AVS. Health Maintenance  Topic Date Due   COVID-19 Vaccine (7 - 2023-24 season) 07/08/2022   Flu Shot  03/01/2023   Medicare Annual Wellness Visit  11/27/2023   DTaP/Tdap/Td vaccine (3 - Td or Tdap) 07/17/2028   Pneumonia Vaccine  Completed   Zoster (Shingles) Vaccine  Completed   HPV Vaccine  Aged Out    12. Provider List Update: Patient Care Team    Relationship Specialty Notifications Start End  Philip Aspen, Limmie Patricia, MD PCP - General Internal Medicine  06/25/18   Mckinley Jewel, MD Consulting Physician Ophthalmology  09/25/18   Sharrell Ku, MD Consulting Physician Gastroenterology  09/25/18   Duke Salvia, MD Consulting Physician Cardiology  09/25/18   Cherlyn Roberts, MD Referring Physician Dermatology  09/25/18   Glenna Fellows, MD (Inactive) Consulting Physician General Surgery  09/25/18   Kelby Aline. Lorrin Jackson.  Dentistry  09/25/18    Comment:  sees every 6 months     13. Advance Directives: Does Patient Have a Medical Advance Directive?: Yes Type of Advance Directive: Healthcare Power of Attorney, Living will, Out of facility DNR (pink MOST or yellow form) Does patient want to make changes to medical advance directive?: No - Patient declined Copy of Healthcare Power of Attorney in Chart?: No - copy requested  14. Opioids: Patient is not on any opioid prescriptions and has no risk factors for a substance use disorder.   15.   Goals      Patient Stated     Increase physical activity by returning to gym     Protect My Health     Timeframe:  Long-Range Goal Priority:  Medium Start Date:                             Expected End Date:                       Follow Up  Date 11/23/2023    - schedule and keep appointment for annual check-up    Why is this important?   Screening tests can find diseases early when they are easier to treat.  Your doctor or nurse will talk with you about which tests are important for you.  Getting shots for common diseases like the flu and shingles will help prevent them.     Notes:          I have personally reviewed and noted the following in the patient's chart:   Medical and social history Use of alcohol, tobacco or illicit drugs  Current medications and supplements Functional ability and status Nutritional status Physical activity Advanced directives List of other physicians Hospitalizations, surgeries, and ER visits in previous 12 months Vitals Screenings to include cognitive, depression, and falls Referrals and appointments  In addition, I have reviewed and discussed with patient certain preventive protocols, quality metrics, and best practice recommendations. A written personalized care plan for preventive services as well as general preventive health recommendations were provided to patient.  Impression and Plan:  Hyperlipidemia, unspecified hyperlipidemia type  Encounter for subsequent annual wellness visit (AWV) in Medicare patient  Acquired hypothyroidism  Vitamin D deficiency  Anemia, unspecified type   -Recommend routine eye and dental care. -Healthy lifestyle discussed in detail. -Labs to be updated today. -Prostate cancer screening: PSA today Health Maintenance  Topic Date Due   COVID-19 Vaccine (7 - 2023-24 season) 07/08/2022   Flu Shot  03/01/2023   Medicare Annual Wellness Visit  11/27/2023   DTaP/Tdap/Td vaccine (3 - Td or Tdap) 07/17/2028   Pneumonia Vaccine  Completed   Zoster (Shingles) Vaccine  Completed   HPV Vaccine  Aged Out      NOTE: All questions for wellness visit were performed on 4/25 in anticipation of today's visit.   Chaya Jan, MD [place]  [facility] Care at [facility]

## 2022-11-28 DIAGNOSIS — H353211 Exudative age-related macular degeneration, right eye, with active choroidal neovascularization: Secondary | ICD-10-CM | POA: Diagnosis not present

## 2022-12-05 NOTE — Progress Notes (Signed)
HPI  M former smoker followed for OSA, complicated by SVT, Syncope, Rhinitis, GERD, Hypothyroid NPSG 01/01/07- AHI 30/ hr, desaturation to 83%  ------------------------------------------------------------------------   12/06/21-  86 yoM former smoker followed for OSA, complicated by SVT, Syncope, Rhinitis, GERD, Hypothyroid, Hyperlipidemia, Macular Degeneration,  CPAP auto 5-15/ [employer] Download- compliance 80%, AHI 0.3/ hr Body weight today-169 lbs Covid vax-2 Moderna, 1 Phizer Flu vax- ------Patient is doing good sleeping good. Wants to talk about the inspire He has been doing extremely well with CPAP.  Sometimes leaves it off if he gets up for the bathroom at the end of the night but no concerns and machine is working very well.  Download reviewed with him.  Wife here and confirms. He asked about the ads for [facility] which we discussed.  He does not feel ready to go further with that at this time. CXR 03/31/21-  IMPRESSION: 1. No acute abnormality of the lungs. 2. Hiatal hernia.  12/07/22-  87 yoM former smoker followed for OSA, complicated by SVT, Syncope, Rhinitis, GERD, Hypothyroid, Hyperlipidemia, Macular Degeneration,  CPAP auto 5-15/ [employer] Download- compliance 77%, AHI 0.4/ hr Body weight today-171 lbs Download reviewed.  He feels he is doing quite well.  He leaves his mask off sometimes after returning from bathroom during the night.  No particular problems. He clears his throat often with rare cough.  Known hiatal hernia.  He admits it might be reflux and we discussed control measures including elevation head of bed.  ROS-see HPI  + = positive Constitutional:    weight loss, night sweats, fevers, chills, fatigue, lassitude. HEENT:    headaches, +difficulty swallowing, tooth/dental problems, sore throat,       +sneezing, itching, ear ache, +nasal congestion, post nasal drip, snoring CV:    chest pain, orthopnea, PND, swelling in lower extremities, anasarca,                                    dizziness, +palpitations Resp:   shortness of breath with exertion or at rest.                productive cough,   +non-productive cough, coughing up of blood.              change in color of mucus.  wheezing.   Skin:    rash or lesions. GI:  + heartburn, indigestion, abdominal pain, nausea, vomiting, diarrhea,                 change in bowel habits, loss of appetite GU: dysuria, change in color of urine, no urgency or frequency.   flank pain. MS:   joint pain, stiffness, decreased range of motion, back pain. Neuro-     nothing unusual Psych:  change in mood or affect.  depression or anxiety.   memory loss.  OBJ- Physical Exam General- Alert, Oriented, Affect-appropriate, Distress- none acute, not obese Skin- rash-none, lesions- none, excoriation- none Lymphadenopathy- none Head- atraumatic            Eyes- Gross vision intact, PERRLA, conjunctivae and secretions clear            Ears- Hearing, canals-normal            Nose- Clear, no-Septal dev, mucus, polyps, erosion, perforation             Throat- Mallampati III , mucosa clear , drainage- none, tonsils- atrophic Neck- flexible ,  trachea midline, no stridor , thyroid nl, carotid no bruit Chest - symmetrical excursion , unlabored           Heart/CV- RRR , no murmur , no gallop  , no rub, nl s1 s2                           - JVD- none , edema- none, stasis changes- none, varices- none           Lung-+ few squeaks in the lung bases/unlabored, wheeze- none, cough+slight , dullness-none, rub- none           Chest wall-  Abd-  Br/ Gen/ Rectal- Not done, not indicated Extrem-  Neuro- grossly intact to observation

## 2022-12-07 ENCOUNTER — Ambulatory Visit (INDEPENDENT_AMBULATORY_CARE_PROVIDER_SITE_OTHER): Payer: Medicare Other

## 2022-12-07 ENCOUNTER — Ambulatory Visit (INDEPENDENT_AMBULATORY_CARE_PROVIDER_SITE_OTHER): Payer: Medicare Other | Admitting: Internal Medicine

## 2022-12-07 ENCOUNTER — Encounter: Payer: Self-pay | Admitting: Internal Medicine

## 2022-12-07 VITALS — BP 128/82 | HR 58 | Ht 69.0 in | Wt 171.0 lb

## 2022-12-07 DIAGNOSIS — K219 Gastro-esophageal reflux disease without esophagitis: Secondary | ICD-10-CM

## 2022-12-07 DIAGNOSIS — J42 Unspecified chronic bronchitis: Secondary | ICD-10-CM

## 2022-12-07 DIAGNOSIS — G4733 Obstructive sleep apnea (adult) (pediatric): Secondary | ICD-10-CM

## 2022-12-07 DIAGNOSIS — K449 Diaphragmatic hernia without obstruction or gangrene: Secondary | ICD-10-CM | POA: Diagnosis not present

## 2022-12-07 NOTE — Patient Instructions (Signed)
We can continue CPAP  auto 5-15  Order- CXR    dx chronic bronchitis  Please call if we can help

## 2022-12-26 ENCOUNTER — Telehealth: Payer: Self-pay | Admitting: Internal Medicine

## 2022-12-26 DIAGNOSIS — H353211 Exudative age-related macular degeneration, right eye, with active choroidal neovascularization: Secondary | ICD-10-CM | POA: Diagnosis not present

## 2022-12-26 NOTE — Telephone Encounter (Signed)
Pt states he had xray done and seen his results on Mychart and will like to know his next steps

## 2022-12-27 ENCOUNTER — Encounter: Payer: Self-pay | Admitting: Internal Medicine

## 2022-12-27 NOTE — Assessment & Plan Note (Signed)
Reminded of reflux precautions, especially at night.

## 2022-12-27 NOTE — Assessment & Plan Note (Signed)
Benefits from CPAP.  Goals and options for comfort discussed.  Reminded of compliance goals. Plan-continue auto 5-15

## 2022-12-28 ENCOUNTER — Telehealth: Payer: Self-pay | Admitting: Internal Medicine

## 2022-12-28 NOTE — Telephone Encounter (Signed)
Pt tested positive for Covid yesterday.  Pt's spouse states MD told her to call the office, if they tested positive.  Both Pts were offered a VV, but stated they just wanted CMA to call them back.  Please advise.

## 2022-12-28 NOTE — Telephone Encounter (Signed)
Dr. Maple Hudson patient has seen results of chest xray. He is wanting to what the next steps will be?

## 2022-12-28 NOTE — Telephone Encounter (Signed)
By "next steps" does he mean for cough?   If so, please offer 2 samples of Breztri inhaler   inhale 2 puffs then rinse mouth, twice daily.  Meanwhile, I suggest he discuss with his primary physician or GI doctor, because I suspect this cough may be related to his known hiatal hernia.

## 2022-12-28 NOTE — Telephone Encounter (Signed)
Spoke with patient.  He states that his symptoms started last Tuesday.  He is feeling better.  I advised him to continue what he was doing and to call back or go to the ED with any SOB.

## 2023-01-01 NOTE — Telephone Encounter (Signed)
Mychart message sent to pt. Will await response from pt about the breztri samples prior to putting some up front for him.

## 2023-01-15 ENCOUNTER — Other Ambulatory Visit: Payer: Self-pay | Admitting: Internal Medicine

## 2023-01-15 DIAGNOSIS — E039 Hypothyroidism, unspecified: Secondary | ICD-10-CM

## 2023-01-15 DIAGNOSIS — I471 Supraventricular tachycardia, unspecified: Secondary | ICD-10-CM

## 2023-01-16 DIAGNOSIS — H353221 Exudative age-related macular degeneration, left eye, with active choroidal neovascularization: Secondary | ICD-10-CM | POA: Diagnosis not present

## 2023-01-24 DIAGNOSIS — H353211 Exudative age-related macular degeneration, right eye, with active choroidal neovascularization: Secondary | ICD-10-CM | POA: Diagnosis not present

## 2023-02-22 DIAGNOSIS — H353211 Exudative age-related macular degeneration, right eye, with active choroidal neovascularization: Secondary | ICD-10-CM | POA: Diagnosis not present

## 2023-03-13 DIAGNOSIS — H353221 Exudative age-related macular degeneration, left eye, with active choroidal neovascularization: Secondary | ICD-10-CM | POA: Diagnosis not present

## 2023-03-27 DIAGNOSIS — H353211 Exudative age-related macular degeneration, right eye, with active choroidal neovascularization: Secondary | ICD-10-CM | POA: Diagnosis not present

## 2023-04-25 DIAGNOSIS — H353211 Exudative age-related macular degeneration, right eye, with active choroidal neovascularization: Secondary | ICD-10-CM | POA: Diagnosis not present

## 2023-05-04 DIAGNOSIS — M6740 Ganglion, unspecified site: Secondary | ICD-10-CM | POA: Diagnosis not present

## 2023-05-04 DIAGNOSIS — M67442 Ganglion, left hand: Secondary | ICD-10-CM | POA: Diagnosis not present

## 2023-05-04 DIAGNOSIS — M19042 Primary osteoarthritis, left hand: Secondary | ICD-10-CM | POA: Diagnosis not present

## 2023-05-07 DIAGNOSIS — H35363 Drusen (degenerative) of macula, bilateral: Secondary | ICD-10-CM | POA: Diagnosis not present

## 2023-05-07 DIAGNOSIS — Z961 Presence of intraocular lens: Secondary | ICD-10-CM | POA: Diagnosis not present

## 2023-05-07 DIAGNOSIS — H35723 Serous detachment of retinal pigment epithelium, bilateral: Secondary | ICD-10-CM | POA: Diagnosis not present

## 2023-05-07 DIAGNOSIS — H353231 Exudative age-related macular degeneration, bilateral, with active choroidal neovascularization: Secondary | ICD-10-CM | POA: Diagnosis not present

## 2023-05-07 DIAGNOSIS — H35453 Secondary pigmentary degeneration, bilateral: Secondary | ICD-10-CM | POA: Diagnosis not present

## 2023-05-08 DIAGNOSIS — L821 Other seborrheic keratosis: Secondary | ICD-10-CM | POA: Diagnosis not present

## 2023-05-08 DIAGNOSIS — D225 Melanocytic nevi of trunk: Secondary | ICD-10-CM | POA: Diagnosis not present

## 2023-05-08 DIAGNOSIS — L570 Actinic keratosis: Secondary | ICD-10-CM | POA: Diagnosis not present

## 2023-05-15 ENCOUNTER — Ambulatory Visit (INDEPENDENT_AMBULATORY_CARE_PROVIDER_SITE_OTHER): Payer: Medicare Other | Admitting: *Deleted

## 2023-05-15 DIAGNOSIS — Z23 Encounter for immunization: Secondary | ICD-10-CM | POA: Diagnosis not present

## 2023-05-16 DIAGNOSIS — H353221 Exudative age-related macular degeneration, left eye, with active choroidal neovascularization: Secondary | ICD-10-CM | POA: Diagnosis not present

## 2023-05-25 DIAGNOSIS — H353211 Exudative age-related macular degeneration, right eye, with active choroidal neovascularization: Secondary | ICD-10-CM | POA: Diagnosis not present

## 2023-06-07 DIAGNOSIS — H353132 Nonexudative age-related macular degeneration, bilateral, intermediate dry stage: Secondary | ICD-10-CM | POA: Diagnosis not present

## 2023-06-07 DIAGNOSIS — H524 Presbyopia: Secondary | ICD-10-CM | POA: Diagnosis not present

## 2023-06-07 DIAGNOSIS — Z961 Presence of intraocular lens: Secondary | ICD-10-CM | POA: Diagnosis not present

## 2023-06-21 ENCOUNTER — Other Ambulatory Visit: Payer: Self-pay | Admitting: Internal Medicine

## 2023-06-21 DIAGNOSIS — E039 Hypothyroidism, unspecified: Secondary | ICD-10-CM

## 2023-06-21 DIAGNOSIS — I471 Supraventricular tachycardia, unspecified: Secondary | ICD-10-CM

## 2023-07-03 DIAGNOSIS — H353211 Exudative age-related macular degeneration, right eye, with active choroidal neovascularization: Secondary | ICD-10-CM | POA: Diagnosis not present

## 2023-07-11 DIAGNOSIS — H353221 Exudative age-related macular degeneration, left eye, with active choroidal neovascularization: Secondary | ICD-10-CM | POA: Diagnosis not present

## 2023-08-09 DIAGNOSIS — H353211 Exudative age-related macular degeneration, right eye, with active choroidal neovascularization: Secondary | ICD-10-CM | POA: Diagnosis not present

## 2023-08-23 ENCOUNTER — Telehealth: Payer: Self-pay | Admitting: *Deleted

## 2023-08-23 ENCOUNTER — Telehealth: Payer: Self-pay | Admitting: Internal Medicine

## 2023-08-23 DIAGNOSIS — J111 Influenza due to unidentified influenza virus with other respiratory manifestations: Secondary | ICD-10-CM

## 2023-08-23 MED ORDER — OSELTAMIVIR PHOSPHATE 75 MG PO CAPS
75.0000 mg | ORAL_CAPSULE | Freq: Two times a day (BID) | ORAL | 0 refills | Status: AC
Start: 2023-08-23 — End: 2023-08-28

## 2023-08-23 NOTE — Telephone Encounter (Signed)
Copied from CRM [id number]. Topic: Clinical - Medication Question >> Aug 23, 2023 10:27 AM Kathryne Eriksson wrote: Reason for CRM: Medication Call In To Pharmacy >> Aug 23, 2023 10:28 AM Kathryne Eriksson wrote: Patient called in stating he has been exposed to the flu and requesting that some medication be calling into the pharmacy for him.

## 2023-08-23 NOTE — Telephone Encounter (Signed)
Spoke to pharmacy staff at [facility], provided previous pharmacy information and script is going to be transferred. This RN spoke to pt, aware and verbalized understanding.  Copied from CRM [id number]. Topic: Clinical - Prescription Issue >> Aug 23, 2023  2:28 PM Drema Balzarine wrote: Reason for CRM: Patient says pharmacy didn't received request for the TAMIFLU? But receipt confirmed by pharmacy today @ 12:16pm. His wife Peydon Biddy was able to pick up hers but he was not. >> Aug 23, 2023  5:20 PM Tiffany H wrote: Patient was prescribed Tamiflu but the Tamiflu was sent to the [facility] of [facility]. Patient advised that [facility] told him that this prescription can be called in verbally. Please assist and re-direct medication.   [facility]  [address] [place], [place], [place] [address] [phone]

## 2023-08-23 NOTE — Telephone Encounter (Signed)
Spoke to the patient and his grandson was diagnosed with the Flu Monday afternoon.  She and his symptoms began Monday with cough, drainage, body aches,and a low grade temp.

## 2023-08-23 NOTE — Telephone Encounter (Signed)
Patient is aware 

## 2023-08-23 NOTE — Telephone Encounter (Signed)
Copied from CRM [id number]. Topic: Clinical - Prescription Issue >> Aug 23, 2023  2:28 PM Drema Balzarine wrote: Reason for CRM: Patient says pharmacy didn't received request for the TAMIFLU? But receipt confirmed by pharmacy today @ 12:16pm. His wife Kaiwen Nulph was able to pick up hers but he was not.

## 2023-09-05 DIAGNOSIS — H353221 Exudative age-related macular degeneration, left eye, with active choroidal neovascularization: Secondary | ICD-10-CM | POA: Diagnosis not present

## 2023-09-25 DIAGNOSIS — H353211 Exudative age-related macular degeneration, right eye, with active choroidal neovascularization: Secondary | ICD-10-CM | POA: Diagnosis not present

## 2023-10-24 DIAGNOSIS — H353211 Exudative age-related macular degeneration, right eye, with active choroidal neovascularization: Secondary | ICD-10-CM | POA: Diagnosis not present

## 2023-10-31 DIAGNOSIS — H353221 Exudative age-related macular degeneration, left eye, with active choroidal neovascularization: Secondary | ICD-10-CM | POA: Diagnosis not present

## 2023-11-05 DIAGNOSIS — H35363 Drusen (degenerative) of macula, bilateral: Secondary | ICD-10-CM | POA: Diagnosis not present

## 2023-11-05 DIAGNOSIS — H43813 Vitreous degeneration, bilateral: Secondary | ICD-10-CM | POA: Diagnosis not present

## 2023-11-05 DIAGNOSIS — H353231 Exudative age-related macular degeneration, bilateral, with active choroidal neovascularization: Secondary | ICD-10-CM | POA: Diagnosis not present

## 2023-11-23 ENCOUNTER — Other Ambulatory Visit: Payer: Self-pay | Admitting: Internal Medicine

## 2023-11-23 DIAGNOSIS — E039 Hypothyroidism, unspecified: Secondary | ICD-10-CM

## 2023-11-23 DIAGNOSIS — I471 Supraventricular tachycardia, unspecified: Secondary | ICD-10-CM

## 2023-11-28 ENCOUNTER — Encounter: Payer: Self-pay | Admitting: Internal Medicine

## 2023-11-28 ENCOUNTER — Ambulatory Visit (INDEPENDENT_AMBULATORY_CARE_PROVIDER_SITE_OTHER): Payer: Medicare Other | Admitting: Internal Medicine

## 2023-11-28 VITALS — BP 120/80 | HR 64 | Temp 97.7°F | Ht 68.5 in | Wt 163.6 lb

## 2023-11-28 DIAGNOSIS — Z0000 Encounter for general adult medical examination without abnormal findings: Secondary | ICD-10-CM

## 2023-11-28 DIAGNOSIS — Z125 Encounter for screening for malignant neoplasm of prostate: Secondary | ICD-10-CM | POA: Diagnosis not present

## 2023-11-28 DIAGNOSIS — Z1509 Genetic susceptibility to other malignant neoplasm: Secondary | ICD-10-CM | POA: Diagnosis not present

## 2023-11-28 DIAGNOSIS — E559 Vitamin D deficiency, unspecified: Secondary | ICD-10-CM

## 2023-11-28 DIAGNOSIS — E785 Hyperlipidemia, unspecified: Secondary | ICD-10-CM

## 2023-11-28 DIAGNOSIS — E039 Hypothyroidism, unspecified: Secondary | ICD-10-CM

## 2023-11-28 DIAGNOSIS — Z1501 Genetic susceptibility to malignant neoplasm of breast: Secondary | ICD-10-CM

## 2023-11-28 DIAGNOSIS — Z1211 Encounter for screening for malignant neoplasm of colon: Secondary | ICD-10-CM | POA: Diagnosis not present

## 2023-11-28 DIAGNOSIS — R351 Nocturia: Secondary | ICD-10-CM | POA: Diagnosis not present

## 2023-11-28 DIAGNOSIS — D649 Anemia, unspecified: Secondary | ICD-10-CM | POA: Diagnosis not present

## 2023-11-28 LAB — CBC WITH DIFFERENTIAL/PLATELET
Basophils Absolute: 0.1 10*3/uL (ref 0.0–0.1)
Basophils Relative: 1.2 % (ref 0.0–3.0)
Eosinophils Absolute: 0.1 10*3/uL (ref 0.0–0.7)
Eosinophils Relative: 2 % (ref 0.0–5.0)
HCT: 45.7 % (ref 39.0–52.0)
Hemoglobin: 15 g/dL (ref 13.0–17.0)
Lymphocytes Relative: 31.5 % (ref 12.0–46.0)
Lymphs Abs: 1.4 10*3/uL (ref 0.7–4.0)
MCHC: 32.8 g/dL (ref 30.0–36.0)
MCV: 87.7 fl (ref 78.0–100.0)
Monocytes Absolute: 0.7 10*3/uL (ref 0.1–1.0)
Monocytes Relative: 15.2 % — ABNORMAL HIGH (ref 3.0–12.0)
Neutro Abs: 2.3 10*3/uL (ref 1.4–7.7)
Neutrophils Relative %: 50.1 % (ref 43.0–77.0)
Platelets: 214 10*3/uL (ref 150.0–400.0)
RBC: 5.21 Mil/uL (ref 4.22–5.81)
RDW: 14.1 % (ref 11.5–15.5)
WBC: 4.6 10*3/uL (ref 4.0–10.5)

## 2023-11-28 LAB — PSA: PSA: 1.04 ng/mL (ref 0.10–4.00)

## 2023-11-28 LAB — LIPID PANEL
Cholesterol: 196 mg/dL (ref 0–200)
HDL: 52.4 mg/dL (ref >39.00)
LDL Cholesterol: 121 mg/dL — ABNORMAL HIGH (ref 0–99)
NonHDL: 143.16
Total CHOL/HDL Ratio: 4
Triglycerides: 113 mg/dL (ref 0.0–149.0)
VLDL: 22.6 mg/dL (ref 0.0–40.0)

## 2023-11-28 LAB — COMPREHENSIVE METABOLIC PANEL WITH GFR
ALT: 17 U/L (ref 0–53)
AST: 19 U/L (ref 0–37)
Albumin: 4.3 g/dL (ref 3.5–5.2)
Alkaline Phosphatase: 100 U/L (ref 39–117)
BUN: 19 mg/dL (ref 6–23)
CO2: 29 meq/L (ref 19–32)
Calcium: 9.4 mg/dL (ref 8.4–10.5)
Chloride: 99 meq/L (ref 96–112)
Creatinine, Ser: 1 mg/dL (ref 0.40–1.50)
GFR: 67.97 mL/min (ref >60.00)
Glucose, Bld: 105 mg/dL — ABNORMAL HIGH (ref 70–99)
Potassium: 4.9 meq/L (ref 3.5–5.1)
Sodium: 135 meq/L (ref 135–145)
Total Bilirubin: 0.7 mg/dL (ref 0.2–1.2)
Total Protein: 7.1 g/dL (ref 6.0–8.3)

## 2023-11-28 LAB — VITAMIN B12: Vitamin B-12: 684 pg/mL (ref 211–911)

## 2023-11-28 LAB — VITAMIN D 25 HYDROXY (VIT D DEFICIENCY, FRACTURES): VITD: 46.09 ng/mL (ref 30.00–100.00)

## 2023-11-28 LAB — TSH: TSH: 0.85 u[IU]/mL (ref 0.35–5.50)

## 2023-11-28 NOTE — Progress Notes (Signed)
 Established Patient Office Visit     CC/Reason for Visit: Subsequent Medicare wellness visit  HPI: [name] is a 89 y.o. male who is coming in today for the above mentioned reasons. Past Medical History is significant for: Hypothyroidism, GERD, seasonal allergies, hypertension.  Feels well without complaints.   Past Medical/Surgical History: Past Medical History:  Diagnosis Date   Anemia    BRCA2 gene mutation positive 11/20/2022   Family history of BRCA2 gene positive 11/02/2022   GERD (gastroesophageal reflux disease)    H/O hiatal hernia    Headache(784.0)    History of cystoscopy 1965   History of hernia repair    left side:1991and 1978 right side: 2009   Hx of benign prostatic hypertrophy    Hx of colonic polyps    Dr. Andriette Keeling removed 2 in 2009   Hyperlipidemia    Hypothyroidism    Macular degeneration    Shortness of breath    with exertion    Sleep apnea    2008 no CPAP machine    SVT (supraventricular tachycardia) (HCC)    2002    Thyroid  disease    hypothyroidism    Past Surgical History:  Procedure Laterality Date   bilateral hernia repair  1610,9604   borkne bones foot  89 yrs old   broken finger  89 yrs old   cystocopy  1965   EXCISION METACARPAL MASS Left 12/03/2020   Procedure: LEFT LONG FINGER EXCISION MUCOID CYST AND DEBRIDEMENT DISTAL INTERPHALANGEAL JOINT;  Surgeon: Brunilda Capra, MD;  Location: [facility];  Service: Orthopedics;  Laterality: Left;   HERNIA REPAIR  1991, 1998, 2009   LAPAROSCOPIC NISSEN FUNDOPLICATION  11/01/2011   Procedure: LAPAROSCOPIC NISSEN FUNDOPLICATION;  Surgeon: Quitman Bucy, MD;  Location: [facility];  Service: General;  Laterality: N/A;  Laparoscopic Repair of Large Hiatal Hernia with Nissen Fundoplication with mesh    pneumonia  89 years old   skin cancer face last ck on 08-22-07     THYROIDECTOMY  1967    Social History:  reports that he quit smoking about 45 years ago. His smoking use  included cigarettes. He has never used smokeless tobacco. He reports current alcohol use of about 1.0 standard drink of alcohol per week. He reports that he does not use drugs.  Allergies: No Known Allergies  Family History:  Family History  Problem Relation Age of Onset   Breast cancer Mother 69   Heart disease Mother    Dementia Mother 78   Thyroid  disease Mother    Macular degeneration Mother    Heart disease Father    Hypertension Father    Heart failure Father    Dementia Father    Breast cancer Maternal Grandmother        dx 33 and 73   Breast cancer Daughter 9       BRCA2 positive   Prostate cancer Son 10     Current Outpatient Medications:    acetaminophen  (TYLENOL ) 500 MG tablet, Take 1,000 mg by mouth every 4 (four) hours as needed. For pain, Disp: , Rfl:    aspirin EC 81 MG tablet, Take 81 mg by mouth daily. Swallow whole., Disp: , Rfl:    Calcium Citrate-Vitamin D  (CALCIUM CITRATE+D3 PO), Take by mouth., Disp: , Rfl:    cetirizine  (ZYRTEC ) 10 MG tablet, Take 10 mg by mouth daily., Disp: , Rfl:    levothyroxine  (SYNTHROID ) 125 MCG tablet, TAKE 1 TABLET BY MOUTH  EVERY MORNING, Disp: 90 tablet, Rfl: 1   metoprolol  succinate (TOPROL -XL) 25 MG 24 hr tablet, TAKE 1 TABLET BY MOUTH DAILY, Disp: 90 tablet, Rfl: 1   Multiple Vitamins-Minerals (ICAPS AREDS 2 PO), Take by mouth., Disp: , Rfl:    omeprazole  (PRILOSEC) 20 MG capsule, TAKE ONE CAPSULE BY MOUTH DAILY, Disp: 90 capsule, Rfl: 1   vitamin B-12 (CYANOCOBALAMIN ) 1000 MCG tablet, Take 500 mcg by mouth daily. Patient states that he takes half tablet once a day, Disp: , Rfl:   Review of Systems:  Negative unless indicated in HPI.   Physical Exam: Vitals:   11/28/23 0914  BP: 120/80  Pulse: 64  Temp: 97.7 F (36.5 C)  TempSrc: Oral  SpO2: 98%  Weight: 163 lb 9.6 oz (74.2 kg)  Height: 5' 8.5" (1.74 m)    Body mass index is 24.51 kg/m.   Physical Exam Vitals reviewed.  Constitutional:      General: He  is not in acute distress.    Appearance: Normal appearance. He is not ill-appearing, toxic-appearing or diaphoretic.  HENT:     Head: Normocephalic.     Right Ear: Tympanic membrane, ear canal and external ear normal. There is no impacted cerumen.     Left Ear: Tympanic membrane, ear canal and external ear normal. There is no impacted cerumen.     Nose: Nose normal.     Mouth/Throat:     Mouth: Mucous membranes are moist.     Pharynx: Oropharynx is clear. No oropharyngeal exudate or posterior oropharyngeal erythema.  Eyes:     General: No scleral icterus.       Right eye: No discharge.        Left eye: No discharge.     Conjunctiva/sclera: Conjunctivae normal.     Pupils: Pupils are equal, round, and reactive to light.  Neck:     Vascular: No carotid bruit.  Cardiovascular:     Rate and Rhythm: Normal rate and regular rhythm.     Pulses: Normal pulses.     Heart sounds: Normal heart sounds.  Pulmonary:     Effort: Pulmonary effort is normal. No respiratory distress.     Breath sounds: Normal breath sounds.  Abdominal:     General: Abdomen is flat. Bowel sounds are normal.     Palpations: Abdomen is soft.  Musculoskeletal:        General: Normal range of motion.     Cervical back: Normal range of motion.  Skin:    General: Skin is warm and dry.  Neurological:     General: No focal deficit present.     Mental Status: He is alert and oriented to person, place, and time. Mental status is at baseline.  Psychiatric:        Mood and Affect: Mood normal.        Behavior: Behavior normal.        Thought Content: Thought content normal.        Judgment: Judgment normal.    Subsequent Medicare wellness visit   1. Risk factors, based on past  M,S,F - Cardiac Risk Factors include: advanced age (>70men, >35 women);hypertension   2.  Physical activities: Dietary issues and exercise activities discussed:      3.  Depression/mood:  Flowsheet Row Office Visit from 11/24/2021 in [facility] Total Score 1        4.  ADL's:    11/28/2023    9:08 AM  In your present state of health, do you have any difficulty performing the following activities:  Hearing? 0  Vision? 1  Comment small writing  Difficulty concentrating or making decisions? 0  Walking or climbing stairs? 0  Dressing or bathing? 0  Doing errands, shopping? 0  Preparing Food and eating ? N  Using the Toilet? N  In the past six months, have you accidently leaked urine? N  Do you have problems with loss of bowel control? N  Managing your Medications? N  Managing your Finances? N  Housekeeping or managing your Housekeeping? N     5.  Fall risk:     11/16/2020   10:09 AM 12/03/2020   12:07 PM 11/24/2021    9:12 AM 11/23/2022    4:37 PM 11/28/2023    9:10 AM  Fall Risk  Falls in the past year? 0  0 0 1  Was there an injury with Fall? 0  0 0 0  Fall Risk Category Calculator 0  0 0 1  Fall Risk Category (Retired) Low  Low    (RETIRED) Patient Fall Risk Level  Moderate fall risk Low fall risk    Patient at Risk for Falls Due to   No Fall Risks    Fall risk Follow up   Falls evaluation completed Falls evaluation completed Falls evaluation completed     6.  Home safety: No problems identified   7.  Height weight, and visual acuity: height and weight as above, vision/hearing: No results found.   8.  Counseling: Counseling given: Not Answered    9. Lab orders based on risk factors: Laboratory update will be reviewed   10. Cognitive assessment:        11/28/2023    9:11 AM 11/23/2022    4:38 PM  6CIT Screen  What Year? 0 points 0 points  What month? 0 points 0 points  What time? 0 points 0 points  Count back from 20 0 points 0 points  Months in reverse 0 points 2 points  Repeat phrase 0 points 0 points  Total Score 0 points 2 points     11. Screening: Patient provided with a written and personalized 5-10 year screening schedule in the AVS. Health  Maintenance  Topic Date Due   COVID-19 Vaccine (8 - 2024-25 season) 10/17/2023   Flu Shot  02/29/2024   Medicare Annual Wellness Visit  11/27/2024   DTaP/Tdap/Td vaccine (3 - Td or Tdap) 07/17/2028   Pneumonia Vaccine  Completed   Zoster (Shingles) Vaccine  Completed   HPV Vaccine  Aged Out   Meningitis B Vaccine  Aged Out    12. Provider List Update: Patient Care Team    Relationship Specialty Notifications Start End  Zilphia Hilt, Charyl Coppersmith, MD PCP - General Internal Medicine  06/25/18   Oris Birmingham, MD Consulting Physician Ophthalmology  09/25/18   Serafin Dames, MD Consulting Physician Gastroenterology  09/25/18   Verona Goodwill, MD Consulting Physician Cardiology  09/25/18   Eduardo Grade, MD Referring Physician Dermatology  09/25/18   Ayesha Lente, MD (Inactive) Consulting Physician General Surgery  09/25/18   Dany Dyke. Sherle Dire.  Dentistry  09/25/18    Comment: sees every 6 months     13. Advance Directives: Does Patient Have a Medical Advance Directive?: Yes Does patient want to make changes to medical advance directive?: No - Patient declined  14. Opioids: Patient is not on any opioid prescriptions and has no risk factors for a  substance use disorder.   15.   Goals      Activity and Exercise Increased     Evidence-based guidance:  Review current exercise levels.  Assess patient perspective on exercise or activity level, barriers to increasing activity, motivation and readiness for change.  Recommend or set healthy exercise goal based on individual tolerance.  Encourage small steps toward making change in amount of exercise or activity.  Urge reduction of sedentary activities or screen time.  Promote group activities within the community or with family or support person.  Consider referral to rehabiliation therapist for assessment and exercise/activity plan.   Notes:      Patient Stated     Increase physical activity by returning to gym       Protect My Health     Timeframe:  Long-Range Goal Priority:  Medium Start Date:                             Expected End Date:                       Follow Up Date 11/23/2023    - schedule and keep appointment for annual check-up    Why is this important?   Screening tests can find diseases early when they are easier to treat.  Your doctor or nurse will talk with you about which tests are important for you.  Getting shots for common diseases like the flu and shingles will help prevent them.     Notes:          I have personally reviewed and noted the following in the patient's chart:   Medical and social history Use of alcohol, tobacco or illicit drugs  Current medications and supplements Functional ability and status Nutritional status Physical activity Advanced directives List of other physicians Hospitalizations, surgeries, and ER visits in previous 12 months Vitals Screenings to include cognitive, depression, and falls Referrals and appointments  In addition, I have reviewed and discussed with patient certain preventive protocols, quality metrics, and best practice recommendations. A written personalized care plan for preventive services as well as general preventive health recommendations were provided to patient.   Impression and Plan:  Medicare annual wellness visit, subsequent  Hyperlipidemia, unspecified hyperlipidemia type -     Lipid panel; Future  Acquired hypothyroidism -     CBC with Differential/Platelet; Future -     Comprehensive metabolic panel with GFR; Future -     TSH; Future  Vitamin D  deficiency -     VITAMIN D  25 Hydroxy (Vit-D Deficiency, Fractures); Future  Anemia, unspecified type -     Vitamin B12; Future  Screening for prostate cancer  Screening for malignant neoplasm of colon  Prostate cancer screening  BRCA2 gene mutation positive  Nocturia -     PSA; Future   -Recommend routine eye and dental care. -Healthy  lifestyle discussed in detail. -Labs to be updated today. -Prostate cancer screening: PSA today Health Maintenance  Topic Date Due   COVID-19 Vaccine (8 - 2024-25 season) 10/17/2023   Flu Shot  02/29/2024   Medicare Annual Wellness Visit  11/27/2024   DTaP/Tdap/Td vaccine (3 - Td or Tdap) 07/17/2028   Pneumonia Vaccine  Completed   Zoster (Shingles) Vaccine  Completed   HPV Vaccine  Aged Out   Meningitis B Vaccine  Aged Out     - He will contact GI to see if it  is time for an updated colonoscopy given his age. - Has routine eye and dental care. - Immunizations are up-to-date. - PSA today.    Marguerita Shih, MD [facility] at [facility]

## 2023-11-29 ENCOUNTER — Encounter: Payer: Self-pay | Admitting: Internal Medicine

## 2023-11-29 DIAGNOSIS — H353211 Exudative age-related macular degeneration, right eye, with active choroidal neovascularization: Secondary | ICD-10-CM | POA: Diagnosis not present

## 2023-12-05 NOTE — Progress Notes (Signed)
 HPI  M former smoker followed for OSA, complicated by SVT, Syncope, Rhinitis, GERD, Hypothyroid NPSG 01/01/07- AHI 30/ hr, desaturation to 83%  ------------------------------------------------------------------------  12/07/22-  87 yoM former smoker followed for OSA, complicated by SVT, Syncope, Rhinitis, GERD, Hypothyroid, Hyperlipidemia, Macular Degeneration,  CPAP auto 5-15/ [employer] Download- compliance 77%, AHI 0.4/ hr Body weight today-171 lbs Download reviewed.  He feels he is doing quite well.  He leaves his mask off sometimes after returning from bathroom during the night.  No particular problems. He clears his throat often with rare cough.  Known hiatal hernia.  He admits it might be reflux and we discussed control measures including elevation head of bed.  12/07/23-  88 yoM former smoker followed for OSA, complicated by SVT, Syncope, Rhinitis, GERD, Hypothyroid, Hyperlipidemia, Macular Degeneration,  CPAP auto 5-15/ [employer] Download- compliance 73%, AHI 0.4/hr Body weight today-   CXR 12/07/22 IMPRESSION: No active cardiopulmonary disease.  ROS-see HPI  + = positive Constitutional:    weight loss, night sweats, fevers, chills, fatigue, lassitude. HEENT:    headaches, +difficulty swallowing, tooth/dental problems, sore throat,       +sneezing, itching, ear ache, +nasal congestion, post nasal drip, snoring CV:    chest pain, orthopnea, PND, swelling in lower extremities, anasarca,                                   dizziness, +palpitations Resp:   shortness of breath with exertion or at rest.                productive cough,   +non-productive cough, coughing up of blood.              change in color of mucus.  wheezing.   Skin:    rash or lesions. GI:  + heartburn, indigestion, abdominal pain, nausea, vomiting, diarrhea,                 change in bowel habits, loss of appetite GU: dysuria, change in color of urine, no urgency or frequency.   flank pain. MS:   joint pain, stiffness,  decreased range of motion, back pain. Neuro-     nothing unusual Psych:  change in mood or affect.  depression or anxiety.   memory loss.  OBJ- Physical Exam General- Alert, Oriented, Affect-appropriate, Distress- none acute, not obese Skin- rash-none, lesions- none, excoriation- none Lymphadenopathy- none Head- atraumatic            Eyes- Gross vision intact, PERRLA, conjunctivae and secretions clear            Ears- Hearing, canals-normal            Nose- Clear, no-Septal dev, mucus, polyps, erosion, perforation             Throat- Mallampati III , mucosa clear , drainage- none, tonsils- atrophic Neck- flexible , trachea midline, no stridor , thyroid  nl, carotid no bruit Chest - symmetrical excursion , unlabored           Heart/CV- RRR , no murmur , no gallop  , no rub, nl s1 s2                           - JVD- none , edema- none, stasis changes- none, varices- none           Lung-+ few squeaks in the lung bases/unlabored, wheeze-  none, cough+slight , dullness-none, rub- none           Chest wall-  Abd-  Br/ Gen/ Rectal- Not done, not indicated Extrem-  Neuro- grossly intact to observation

## 2023-12-07 ENCOUNTER — Encounter: Payer: Self-pay | Admitting: Internal Medicine

## 2023-12-07 ENCOUNTER — Ambulatory Visit (INDEPENDENT_AMBULATORY_CARE_PROVIDER_SITE_OTHER): Payer: Medicare Other | Admitting: Internal Medicine

## 2023-12-07 VITALS — BP 118/72 | HR 61 | Temp 97.8°F | Ht 68.5 in | Wt 168.4 lb

## 2023-12-07 DIAGNOSIS — Z87891 Personal history of nicotine dependence: Secondary | ICD-10-CM

## 2023-12-07 DIAGNOSIS — G4733 Obstructive sleep apnea (adult) (pediatric): Secondary | ICD-10-CM

## 2023-12-07 DIAGNOSIS — Z9981 Dependence on supplemental oxygen: Secondary | ICD-10-CM

## 2023-12-07 NOTE — Patient Instructions (Signed)
 [place]- DME [employer]- please replace old CPAP machine (beginning to make motor noise) auto 5-5, mask of choice, humidifier, supplies, AirView/ card  Please call if we can help

## 2023-12-26 DIAGNOSIS — H353221 Exudative age-related macular degeneration, left eye, with active choroidal neovascularization: Secondary | ICD-10-CM | POA: Diagnosis not present

## 2024-01-03 DIAGNOSIS — H353211 Exudative age-related macular degeneration, right eye, with active choroidal neovascularization: Secondary | ICD-10-CM | POA: Diagnosis not present

## 2024-01-30 ENCOUNTER — Ambulatory Visit: Admitting: Internal Medicine

## 2024-01-30 ENCOUNTER — Encounter: Payer: Self-pay | Admitting: Internal Medicine

## 2024-01-30 VITALS — BP 110/80 | HR 55 | Temp 98.1°F | Wt 167.5 lb

## 2024-01-30 DIAGNOSIS — I440 Atrioventricular block, first degree: Secondary | ICD-10-CM | POA: Diagnosis not present

## 2024-01-30 DIAGNOSIS — R001 Bradycardia, unspecified: Secondary | ICD-10-CM | POA: Diagnosis not present

## 2024-01-30 NOTE — Progress Notes (Signed)
 Established Patient Office Visit     CC/Reason for Visit: Low heart rate  HPI: [name] is a 89 y.o. male who is coming in today for the above mentioned reasons.  He recently purchased an [employer] and for the last couple weeks has been getting alerts of nighttime bradycardia.  The lowest reading has been 38.  He has had some very mild dizzy spells.  Otherwise feels well.   Past Medical/Surgical History: Past Medical History:  Diagnosis Date   Anemia    BRCA2 gene mutation positive 11/20/2022   Family history of BRCA2 gene positive 11/02/2022   GERD (gastroesophageal reflux disease)    H/O hiatal hernia    Headache(784.0)    History of cystoscopy 1965   History of hernia repair    left side:1991and 1978 right side: 2009   Hx of benign prostatic hypertrophy    Hx of colonic polyps    Dr. Luis removed 2 in 2009   Hyperlipidemia    Hypothyroidism    Macular degeneration    Shortness of breath    with exertion    Sleep apnea    2008 no CPAP machine    SVT (supraventricular tachycardia) (HCC)    2002    Thyroid  disease    hypothyroidism    Past Surgical History:  Procedure Laterality Date   bilateral hernia repair  [id number]   borkne bones foot  89 yrs old   broken finger  89 yrs old   cystocopy  1965   EXCISION METACARPAL MASS Left 12/03/2020   Procedure: LEFT LONG FINGER EXCISION MUCOID CYST AND DEBRIDEMENT DISTAL INTERPHALANGEAL JOINT;  Surgeon: Murrell Drivers, MD;  Location: [facility];  Service: Orthopedics;  Laterality: Left;   HERNIA REPAIR  1991, 1998, 2009   LAPAROSCOPIC NISSEN FUNDOPLICATION  11/01/2011   Procedure: LAPAROSCOPIC NISSEN FUNDOPLICATION;  Surgeon: Morene ONEIDA Olives, MD;  Location: [facility];  Service: General;  Laterality: N/A;  Laparoscopic Repair of Large Hiatal Hernia with Nissen Fundoplication with mesh    pneumonia  89 years old   skin cancer face last ck on 08-22-07     THYROIDECTOMY  1967    Social History:   reports that he quit smoking about 45 years ago. His smoking use included cigarettes. He has never used smokeless tobacco. He reports current alcohol use of about 1.0 standard drink of alcohol per week. He reports that he does not use drugs.  Allergies: No Known Allergies  Family History:  Family History  Problem Relation Age of Onset   Breast cancer Mother 61   Heart disease Mother    Dementia Mother 82   Thyroid  disease Mother    Macular degeneration Mother    Heart disease Father    Hypertension Father    Heart failure Father    Dementia Father    Breast cancer Maternal Grandmother        dx 44 and 96   Breast cancer Daughter 41       BRCA2 positive   Prostate cancer Son 74     Current Outpatient Medications:    acetaminophen  (TYLENOL ) 500 MG tablet, Take 1,000 mg by mouth every 4 (four) hours as needed. For pain, Disp: , Rfl:    aspirin EC 81 MG tablet, Take 81 mg by mouth daily. Swallow whole., Disp: , Rfl:    B Complex-C (SUPER B COMPLEX PO), Take by mouth., Disp: , Rfl:    Calcium Citrate-Vitamin D  (CALCIUM  CITRATE+D3 PO), Take by mouth., Disp: , Rfl:    cetirizine  (ZYRTEC ) 10 MG tablet, Take 10 mg by mouth daily., Disp: , Rfl:    levothyroxine  (SYNTHROID ) 125 MCG tablet, TAKE 1 TABLET BY MOUTH EVERY MORNING, Disp: 90 tablet, Rfl: 1   Multiple Vitamins-Minerals (ICAPS AREDS 2 PO), Take by mouth., Disp: , Rfl:    omeprazole  (PRILOSEC) 20 MG capsule, TAKE ONE CAPSULE BY MOUTH DAILY, Disp: 90 capsule, Rfl: 1   vitamin B-12 (CYANOCOBALAMIN ) 1000 MCG tablet, Take 500 mcg by mouth daily. Patient states that he takes half tablet once a day, Disp: , Rfl:   Review of Systems:  Negative unless indicated in HPI.   Physical Exam: Vitals:   01/30/24 0819  BP: 110/80  Pulse: (!) 55  Temp: 98.1 F (36.7 C)  TempSrc: Oral  SpO2: 95%  Weight: 167 lb 8 oz (76 kg)    Body mass index is 25.1 kg/m.   Physical Exam Vitals reviewed.  Constitutional:      Appearance: Normal  appearance.  HENT:     Head: Normocephalic and atraumatic.  Eyes:     Conjunctiva/sclera: Conjunctivae normal.  Cardiovascular:     Rate and Rhythm: Normal rate and regular rhythm.  Pulmonary:     Effort: Pulmonary effort is normal.     Breath sounds: Normal breath sounds.  Skin:    General: Skin is warm and dry.  Neurological:     General: No focal deficit present.     Mental Status: He is alert and oriented to person, place, and time.  Psychiatric:        Mood and Affect: Mood normal.        Behavior: Behavior normal.        Thought Content: Thought content normal.        Judgment: Judgment normal.      Impression and Plan:  Bradycardia -     EKG 12-Lead  First degree heart block  -EKG done in office today shows first-degree heart block with a heart rate of 55. - He is on 25 mg of extended release metoprolol .  I will discontinue this and have him return in 3 months for follow-up.   Time spent:31 minutes reviewing chart, interviewing and examining patient and formulating plan of care.     [name], MD [facility] at [facility]

## 2024-02-07 ENCOUNTER — Telehealth: Payer: Self-pay

## 2024-02-07 DIAGNOSIS — G4733 Obstructive sleep apnea (adult) (pediatric): Secondary | ICD-10-CM

## 2024-02-07 NOTE — Telephone Encounter (Signed)
 Order- DME [employer]- please change autopap range to 4-10

## 2024-02-07 NOTE — Telephone Encounter (Signed)
 Copied from CRM [id number]. Topic: Clinical - Order For Equipment >> Feb 06, 2024  3:36 PM [name] wrote: Reason for CRM: pt has had his new cpap for about 2 weeks. He states the pressure seems way too high.  It blows him away, causing dry mouth. Just very uncomfortable. Probably getting 3 hrs of sleep a night. Please advise  Please advise Dr. Neysa

## 2024-02-08 NOTE — Telephone Encounter (Signed)
 Order placed. Tried contacting the patient, no answer- left detailed vm regarding order placed to change cpap pressure.

## 2024-02-13 NOTE — Telephone Encounter (Signed)
 Copied from CRM [id number]. Topic: Clinical - Order For Equipment >> Feb 12, 2024  3:07 PM Isabell A wrote: Reason for CRM: Patient returning phone call from [place] - in regard to his CPAP machine.   Callback number: [phone] or [phone]  Called and spoke with the patient regarding pressure change. Pt is aware and will contact clinic with further issues. Nfn

## 2024-02-20 DIAGNOSIS — H353221 Exudative age-related macular degeneration, left eye, with active choroidal neovascularization: Secondary | ICD-10-CM | POA: Diagnosis not present

## 2024-02-26 DIAGNOSIS — H353211 Exudative age-related macular degeneration, right eye, with active choroidal neovascularization: Secondary | ICD-10-CM | POA: Diagnosis not present

## 2024-04-16 DIAGNOSIS — H353221 Exudative age-related macular degeneration, left eye, with active choroidal neovascularization: Secondary | ICD-10-CM | POA: Diagnosis not present

## 2024-04-21 DIAGNOSIS — H353211 Exudative age-related macular degeneration, right eye, with active choroidal neovascularization: Secondary | ICD-10-CM | POA: Diagnosis not present

## 2024-04-24 DIAGNOSIS — Z23 Encounter for immunization: Secondary | ICD-10-CM | POA: Diagnosis not present

## 2024-04-29 ENCOUNTER — Encounter: Payer: Self-pay | Admitting: Internal Medicine

## 2024-04-29 ENCOUNTER — Ambulatory Visit: Attending: Internal Medicine

## 2024-04-29 ENCOUNTER — Ambulatory Visit: Admitting: Internal Medicine

## 2024-04-29 VITALS — BP 120/80 | HR 63 | Temp 97.8°F | Wt 162.4 lb

## 2024-04-29 DIAGNOSIS — E785 Hyperlipidemia, unspecified: Secondary | ICD-10-CM | POA: Diagnosis not present

## 2024-04-29 DIAGNOSIS — R002 Palpitations: Secondary | ICD-10-CM

## 2024-04-29 DIAGNOSIS — I440 Atrioventricular block, first degree: Secondary | ICD-10-CM

## 2024-04-29 DIAGNOSIS — E559 Vitamin D deficiency, unspecified: Secondary | ICD-10-CM

## 2024-04-29 DIAGNOSIS — E039 Hypothyroidism, unspecified: Secondary | ICD-10-CM

## 2024-04-29 DIAGNOSIS — Z23 Encounter for immunization: Secondary | ICD-10-CM

## 2024-04-29 NOTE — Progress Notes (Signed)
 Established Patient Office Visit     CC/Reason for Visit: Follow-up bradycardia  HPI: [name] is a 89 y.o. male who is coming in today for the above mentioned reasons. Past Medical History is significant for: Hypothyroidism, GERD, B12 deficiency, vitamin D  deficiency.  At last visit we took him off metoprolol  as his Apple Watch was giving him bradycardia notifications with heart rate into the 30s.  EKG done in office showed a first-degree heart block.  He would like me to look at his Apple Watch reviews again today.  It is saying that he spends about 4% of the day in A-fib.  He does not have a history of A-fib.  I am unable to retrieve the Apple Watch tracings from these episodes.  He has not had palpitations, chest pain or shortness of breath.  Requesting updated vaccines.   Past Medical/Surgical History: Past Medical History:  Diagnosis Date   Anemia    BRCA2 gene mutation positive 11/20/2022   Family history of BRCA2 gene positive 11/02/2022   GERD (gastroesophageal reflux disease)    H/O hiatal hernia    Headache(784.0)    History of cystoscopy 1965   History of hernia repair    left side:1991and 1978 right side: 2009   Hx of benign prostatic hypertrophy    Hx of colonic polyps    Dr. Luis removed 2 in 2009   Hyperlipidemia    Hypothyroidism    Macular degeneration    Shortness of breath    with exertion    Sleep apnea    2008 no CPAP machine    SVT (supraventricular tachycardia)    2002    Thyroid  disease    hypothyroidism    Past Surgical History:  Procedure Laterality Date   bilateral hernia repair  8008,7990   borkne bones foot  89 yrs old   broken finger  89 yrs old   cystocopy  1965   EXCISION METACARPAL MASS Left 12/03/2020   Procedure: LEFT LONG FINGER EXCISION MUCOID CYST AND DEBRIDEMENT DISTAL INTERPHALANGEAL JOINT;  Surgeon: Murrell Drivers, MD;  Location: [facility];  Service: Orthopedics;  Laterality: Left;   HERNIA REPAIR   1991, 1998, 2009   LAPAROSCOPIC NISSEN FUNDOPLICATION  11/01/2011   Procedure: LAPAROSCOPIC NISSEN FUNDOPLICATION;  Surgeon: Morene ONEIDA Olives, MD;  Location: [facility];  Service: General;  Laterality: N/A;  Laparoscopic Repair of Large Hiatal Hernia with Nissen Fundoplication with mesh    pneumonia  89 years old   skin cancer face last ck on 08-22-07     THYROIDECTOMY  1967    Social History:  reports that he quit smoking about 45 years ago. His smoking use included cigarettes. He has never used smokeless tobacco. He reports current alcohol use of about 1.0 standard drink of alcohol per week. He reports that he does not use drugs.  Allergies: No Known Allergies  Family History:  Family History  Problem Relation Age of Onset   Breast cancer Mother 62   Heart disease Mother    Dementia Mother 38   Thyroid  disease Mother    Macular degeneration Mother    Heart disease Father    Hypertension Father    Heart failure Father    Dementia Father    Breast cancer Maternal Grandmother        dx 73 and 70   Breast cancer Daughter 39       BRCA2 positive   Prostate cancer Son 63  Current Outpatient Medications:    acetaminophen  (TYLENOL ) 500 MG tablet, Take 1,000 mg by mouth every 4 (four) hours as needed. For pain, Disp: , Rfl:    aspirin EC 81 MG tablet, Take 81 mg by mouth daily. Swallow whole., Disp: , Rfl:    B Complex-C (SUPER B COMPLEX PO), Take by mouth., Disp: , Rfl:    Calcium Citrate-Vitamin D  (CALCIUM CITRATE+D3 PO), Take by mouth., Disp: , Rfl:    cetirizine  (ZYRTEC ) 10 MG tablet, Take 10 mg by mouth daily., Disp: , Rfl:    levothyroxine  (SYNTHROID ) 125 MCG tablet, TAKE 1 TABLET BY MOUTH EVERY MORNING, Disp: 90 tablet, Rfl: 1   Multiple Vitamins-Minerals (ICAPS AREDS 2 PO), Take by mouth., Disp: , Rfl:    omeprazole  (PRILOSEC) 20 MG capsule, TAKE ONE CAPSULE BY MOUTH DAILY, Disp: 90 capsule, Rfl: 1   vitamin B-12 (CYANOCOBALAMIN ) 1000 MCG tablet, Take 500 mcg by mouth  daily. Patient states that he takes half tablet once a day, Disp: , Rfl:   Review of Systems:  Negative unless indicated in HPI.   Physical Exam: Vitals:   04/29/24 0846  BP: 120/80  Pulse: 63  Temp: 97.8 F (36.6 C)  TempSrc: Oral  SpO2: 98%  Weight: 162 lb 6.4 oz (73.7 kg)    Body mass index is 24.33 kg/m.   Physical Exam Vitals reviewed.  Constitutional:      Appearance: Normal appearance.  HENT:     Head: Normocephalic and atraumatic.  Eyes:     Conjunctiva/sclera: Conjunctivae normal.     Pupils: Pupils are equal, round, and reactive to light.  Cardiovascular:     Rate and Rhythm: Normal rate and regular rhythm.  Pulmonary:     Effort: Pulmonary effort is normal.     Breath sounds: Normal breath sounds.  Skin:    General: Skin is warm and dry.  Neurological:     General: No focal deficit present.     Mental Status: He is alert and oriented to person, place, and time.  Psychiatric:        Mood and Affect: Mood normal.        Behavior: Behavior normal.        Thought Content: Thought content normal.        Judgment: Judgment normal.      Impression and Plan:  Acquired hypothyroidism  Dyslipidemia  Vitamin D  deficiency  First degree heart block  Immunization due  Palpitations -     LONG TERM MONITOR (3-14 DAYS); Future   - Flu and PCV 20 given in office today. - Given his A-fib notifications from his Apple Watch will send for a 14-day Zio patch.  Further workup will depend on results.  Time spent:30 minutes reviewing chart, interviewing and examining patient and formulating plan of care.     [name], MD [facility]

## 2024-04-29 NOTE — Addendum Note (Signed)
 Addended by: KATHRYNE MILLMAN B on: 04/29/2024 10:12 AM   Modules accepted: Orders

## 2024-04-29 NOTE — Progress Notes (Unsigned)
 EP to read.

## 2024-05-07 DIAGNOSIS — H43813 Vitreous degeneration, bilateral: Secondary | ICD-10-CM | POA: Diagnosis not present

## 2024-05-07 DIAGNOSIS — H53411 Scotoma involving central area, right eye: Secondary | ICD-10-CM | POA: Diagnosis not present

## 2024-05-07 DIAGNOSIS — H353231 Exudative age-related macular degeneration, bilateral, with active choroidal neovascularization: Secondary | ICD-10-CM | POA: Diagnosis not present

## 2024-05-07 DIAGNOSIS — H35363 Drusen (degenerative) of macula, bilateral: Secondary | ICD-10-CM | POA: Diagnosis not present

## 2024-05-08 DIAGNOSIS — L570 Actinic keratosis: Secondary | ICD-10-CM | POA: Diagnosis not present

## 2024-05-08 DIAGNOSIS — D225 Melanocytic nevi of trunk: Secondary | ICD-10-CM | POA: Diagnosis not present

## 2024-05-08 DIAGNOSIS — L821 Other seborrheic keratosis: Secondary | ICD-10-CM | POA: Diagnosis not present

## 2024-05-12 ENCOUNTER — Other Ambulatory Visit: Payer: Self-pay | Admitting: Internal Medicine

## 2024-05-12 DIAGNOSIS — E039 Hypothyroidism, unspecified: Secondary | ICD-10-CM

## 2024-05-27 DIAGNOSIS — R002 Palpitations: Secondary | ICD-10-CM

## 2024-06-04 ENCOUNTER — Ambulatory Visit: Payer: Self-pay | Admitting: Internal Medicine

## 2024-06-04 DIAGNOSIS — I4729 Other ventricular tachycardia: Secondary | ICD-10-CM

## 2024-06-09 DIAGNOSIS — Z961 Presence of intraocular lens: Secondary | ICD-10-CM | POA: Diagnosis not present

## 2024-06-09 DIAGNOSIS — H353232 Exudative age-related macular degeneration, bilateral, with inactive choroidal neovascularization: Secondary | ICD-10-CM | POA: Diagnosis not present

## 2024-06-09 DIAGNOSIS — H52203 Unspecified astigmatism, bilateral: Secondary | ICD-10-CM | POA: Diagnosis not present

## 2024-06-09 DIAGNOSIS — H1132 Conjunctival hemorrhage, left eye: Secondary | ICD-10-CM | POA: Diagnosis not present

## 2024-06-09 DIAGNOSIS — H524 Presbyopia: Secondary | ICD-10-CM | POA: Diagnosis not present

## 2024-06-11 DIAGNOSIS — H353221 Exudative age-related macular degeneration, left eye, with active choroidal neovascularization: Secondary | ICD-10-CM | POA: Diagnosis not present

## 2024-06-13 DIAGNOSIS — H353211 Exudative age-related macular degeneration, right eye, with active choroidal neovascularization: Secondary | ICD-10-CM | POA: Diagnosis not present

## 2024-08-08 NOTE — Progress Notes (Unsigned)
 "     Cardiology Office Note Date:  08/08/2024  ID:  [name], DOB 06-Jun-1935, MRN [id number] PCP:  [name], Tully GRADE, MD  Cardiologist:  [name] VEAR Ren Donley, MD  No chief complaint on file.    Problems Palpitations EM 10/25: 38 nonsustained SVT (longest 11 beats), occasional SVE 4.3% OSA M: ASA81  Visits  1/26: LDL 121 4/25, RN5, XL25, TTE     ROS: Please see the history of present illness. All other systems are reviewed and negative.   Past Medical History:  Diagnosis Date   Anemia    BRCA2 gene mutation positive 11/20/2022   Family history of BRCA2 gene positive 11/02/2022   GERD (gastroesophageal reflux disease)    H/O hiatal hernia    Headache(784.0)    History of cystoscopy 1965   History of hernia repair    left side:1991and 1978 right side: 2009   Hx of benign prostatic hypertrophy    Hx of colonic polyps    Dr. Luis removed 2 in 2009   Hyperlipidemia    Hypothyroidism    Macular degeneration    Shortness of breath    with exertion    Sleep apnea    2008 no CPAP machine    SVT (supraventricular tachycardia)    2002    Thyroid  disease    hypothyroidism    Past Surgical History:  Procedure Laterality Date   bilateral hernia repair  8008,7990   borkne bones foot  89 yrs old   broken finger  89 yrs old   cystocopy  1965   EXCISION METACARPAL MASS Left 12/03/2020   Procedure: LEFT LONG FINGER EXCISION MUCOID CYST AND DEBRIDEMENT DISTAL INTERPHALANGEAL JOINT;  Surgeon: [name] Drivers, MD;  Location: [facility];  Service: Orthopedics;  Laterality: Left;   HERNIA REPAIR  1991, 1998, 2009   LAPAROSCOPIC NISSEN FUNDOPLICATION  11/01/2011   Procedure: LAPAROSCOPIC NISSEN FUNDOPLICATION;  Surgeon: [name] ONEIDA Olives, MD;  Location: [facility];  Service: General;  Laterality: N/A;  Laparoscopic Repair of Large Hiatal Hernia with Nissen Fundoplication with mesh    pneumonia  89 years old   skin cancer face last ck on 08-22-07      THYROIDECTOMY  1967    Current Outpatient Medications  Medication Sig Dispense Refill   acetaminophen  (TYLENOL ) 500 MG tablet Take 1,000 mg by mouth every 4 (four) hours as needed. For pain     aspirin EC 81 MG tablet Take 81 mg by mouth daily. Swallow whole.     B Complex-C (SUPER B COMPLEX PO) Take by mouth.     Calcium Citrate-Vitamin D  (CALCIUM CITRATE+D3 PO) Take by mouth.     cetirizine  (ZYRTEC ) 10 MG tablet Take 10 mg by mouth daily.     levothyroxine  (SYNTHROID ) 125 MCG tablet TAKE 1 TABLET BY MOUTH EVERY MORNING 90 tablet 1   Multiple Vitamins-Minerals (ICAPS AREDS 2 PO) Take by mouth.     omeprazole  (PRILOSEC) 20 MG capsule TAKE ONE CAPSULE BY MOUTH DAILY 90 capsule 1   vitamin B-12 (CYANOCOBALAMIN ) 1000 MCG tablet Take 500 mcg by mouth daily. Patient states that he takes half tablet once a day     No current facility-administered medications for this visit.    Allergies:   Patient has no known allergies.   Social History:  see above  Family History:  see above  PHYSICAL EXAM: VS:  There were no vitals taken for this visit. , BMI There is no height or  weight on file to calculate BMI. GEN: Well nourished, well developed, in no acute distress HEENT: normal Neck: no JVD, carotid bruits, or masses Cardiac: ***RRR; no murmurs, rubs, or gallops,no edema  Respiratory:  CTAB bilaterally, normal work of breathing GI: soft, nontender, nondistended, + BS Extremities: No LE edema Skin: warm and dry, no rash Neuro:  Strength and sensation are intact  EKG: ***  Recent Labs: Reviewed  Studies: Reviewed  ASSESSMENT AND PLAN: [name] is a 89 y.o. male who presents for new visit.    Signed, [name] VEAR Ren Donley, MD  08/08/2024 10:02 AM    [facility] "

## 2024-08-11 ENCOUNTER — Ambulatory Visit: Attending: Cardiology

## 2024-08-11 VITALS — BP 120/60 | HR 60 | Ht 70.0 in | Wt 164.2 lb

## 2024-08-11 DIAGNOSIS — G4733 Obstructive sleep apnea (adult) (pediatric): Secondary | ICD-10-CM | POA: Insufficient documentation

## 2024-08-11 DIAGNOSIS — Z0189 Encounter for other specified special examinations: Secondary | ICD-10-CM | POA: Insufficient documentation

## 2024-08-11 DIAGNOSIS — I471 Supraventricular tachycardia, unspecified: Secondary | ICD-10-CM | POA: Diagnosis not present

## 2024-08-11 DIAGNOSIS — E785 Hyperlipidemia, unspecified: Secondary | ICD-10-CM | POA: Diagnosis not present

## 2024-08-11 DIAGNOSIS — R002 Palpitations: Secondary | ICD-10-CM | POA: Insufficient documentation

## 2024-08-11 NOTE — Patient Instructions (Signed)
 Medication Instructions:  Your physician recommends that you continue on your current medications as directed. Please refer to the Current Medication list given to you today.  *If you need a refill on your cardiac medications before your next appointment, please call your pharmacy*  Lab Work: None ordered If you have labs (blood work) drawn today and your tests are completely normal, you will receive your results only by: MyChart Message (if you have MyChart) OR A paper copy in the mail If you have any lab test that is abnormal or we need to change your treatment, we will call you to review the results.  Testing/Procedures: Your physician has requested that you have an echocardiogram. Echocardiography is a painless test that uses sound waves to create images of your heart. It provides your doctor with information about the size and shape of your heart and how well your hearts chambers and valves are working. This procedure takes approximately one hour. There are no restrictions for this procedure. Please do NOT wear cologne, perfume, aftershave, or lotions (deodorant is allowed). Please arrive 15 minutes prior to your appointment time.  Please note: We ask at that you not bring children with you during ultrasound (echo/ vascular) testing. Due to room size and safety concerns, children are not allowed in the ultrasound rooms during exams. Our front office staff cannot provide observation of children in our lobby area while testing is being conducted. An adult accompanying a patient to their appointment will only be allowed in the ultrasound room at the discretion of the ultrasound technician under special circumstances. We apologize for any inconvenience.   Follow-Up: At [facility], you and your health needs are our priority.  As part of our continuing mission to provide you with exceptional heart care, our providers are all part of one team.  This team includes your primary  Cardiologist (physician) and Advanced Practice Providers or APPs (Physician Assistants and Nurse Practitioners) who all work together to provide you with the care you need, when you need it.  Your next appointment:   1 year(s)  Provider:   Dr Ren

## 2024-09-15 ENCOUNTER — Ambulatory Visit (HOSPITAL_COMMUNITY)

## 2024-12-01 ENCOUNTER — Ambulatory Visit: Admitting: Internal Medicine

## 2024-12-11 ENCOUNTER — Encounter: Admitting: Adult Health
